# Patient Record
Sex: Male | Born: 1950 | Race: White | Hispanic: No | Marital: Married | Smoking: Never smoker
Health system: Southern US, Community
[De-identification: ages and names within clinical notes are randomized; demographics above are authoritative.]

## PROBLEM LIST (undated history)

## (undated) DIAGNOSIS — R6510 Systemic inflammatory response syndrome (SIRS) of non-infectious origin without acute organ dysfunction: Secondary | ICD-10-CM

## (undated) DIAGNOSIS — E119 Type 2 diabetes mellitus without complications: Secondary | ICD-10-CM

## (undated) DIAGNOSIS — N179 Acute kidney failure, unspecified: Secondary | ICD-10-CM

## (undated) DIAGNOSIS — I2510 Atherosclerotic heart disease of native coronary artery without angina pectoris: Secondary | ICD-10-CM

## (undated) DIAGNOSIS — I10 Essential (primary) hypertension: Secondary | ICD-10-CM

## (undated) DIAGNOSIS — I219 Acute myocardial infarction, unspecified: Secondary | ICD-10-CM

## (undated) DIAGNOSIS — I639 Cerebral infarction, unspecified: Secondary | ICD-10-CM

---

## 2000-06-18 ENCOUNTER — Emergency Department (HOSPITAL_COMMUNITY): Admission: EM | Admit: 2000-06-18 | Discharge: 2000-06-18 | Payer: Self-pay | Admitting: Emergency Medicine

## 2002-02-17 ENCOUNTER — Emergency Department (HOSPITAL_COMMUNITY): Admission: EM | Admit: 2002-02-17 | Discharge: 2002-02-17 | Payer: Self-pay

## 2002-11-25 DIAGNOSIS — I639 Cerebral infarction, unspecified: Secondary | ICD-10-CM

## 2002-11-25 HISTORY — DX: Cerebral infarction, unspecified: I63.9

## 2003-08-28 ENCOUNTER — Encounter: Payer: Self-pay | Admitting: Emergency Medicine

## 2003-08-28 ENCOUNTER — Inpatient Hospital Stay (HOSPITAL_COMMUNITY): Admission: EM | Admit: 2003-08-28 | Discharge: 2003-09-01 | Payer: Self-pay | Admitting: Emergency Medicine

## 2003-08-29 ENCOUNTER — Encounter: Payer: Self-pay | Admitting: Neurology

## 2003-08-30 ENCOUNTER — Encounter: Payer: Self-pay | Admitting: Neurology

## 2003-09-01 ENCOUNTER — Inpatient Hospital Stay (HOSPITAL_COMMUNITY)
Admission: RE | Admit: 2003-09-01 | Discharge: 2003-09-23 | Payer: Self-pay | Admitting: Physical Medicine & Rehabilitation

## 2003-09-06 ENCOUNTER — Encounter: Payer: Self-pay | Admitting: Physical Medicine & Rehabilitation

## 2003-11-01 ENCOUNTER — Encounter
Admission: RE | Admit: 2003-11-01 | Discharge: 2004-01-30 | Payer: Self-pay | Admitting: Physical Medicine & Rehabilitation

## 2003-11-08 ENCOUNTER — Encounter: Admission: RE | Admit: 2003-11-08 | Discharge: 2004-02-06 | Payer: Self-pay | Admitting: Internal Medicine

## 2004-02-24 ENCOUNTER — Emergency Department (HOSPITAL_COMMUNITY): Admission: EM | Admit: 2004-02-24 | Discharge: 2004-02-24 | Payer: Self-pay | Admitting: Emergency Medicine

## 2004-03-20 ENCOUNTER — Encounter
Admission: RE | Admit: 2004-03-20 | Discharge: 2004-06-18 | Payer: Self-pay | Admitting: Physical Medicine & Rehabilitation

## 2004-04-23 ENCOUNTER — Encounter
Admission: RE | Admit: 2004-04-23 | Discharge: 2004-07-22 | Payer: Self-pay | Admitting: Physical Medicine & Rehabilitation

## 2004-05-10 ENCOUNTER — Encounter
Admission: RE | Admit: 2004-05-10 | Discharge: 2004-07-22 | Payer: Self-pay | Admitting: Physical Medicine & Rehabilitation

## 2004-06-13 ENCOUNTER — Ambulatory Visit (HOSPITAL_COMMUNITY): Admission: RE | Admit: 2004-06-13 | Discharge: 2004-06-13 | Payer: Self-pay | Admitting: Neurology

## 2004-06-15 ENCOUNTER — Ambulatory Visit (HOSPITAL_COMMUNITY): Admission: RE | Admit: 2004-06-15 | Discharge: 2004-06-15 | Payer: Self-pay | Admitting: Interventional Radiology

## 2004-07-25 ENCOUNTER — Inpatient Hospital Stay (HOSPITAL_COMMUNITY): Admission: RE | Admit: 2004-07-25 | Discharge: 2004-07-27 | Payer: Self-pay | Admitting: Interventional Radiology

## 2004-07-25 HISTORY — PX: OTHER SURGICAL HISTORY: SHX169

## 2004-08-21 ENCOUNTER — Encounter: Admission: RE | Admit: 2004-08-21 | Discharge: 2004-10-02 | Payer: Self-pay | Admitting: Neurology

## 2004-08-21 ENCOUNTER — Encounter: Admission: RE | Admit: 2004-08-21 | Discharge: 2004-08-21 | Payer: Self-pay | Admitting: Interventional Radiology

## 2004-08-27 ENCOUNTER — Encounter: Admission: RE | Admit: 2004-08-27 | Discharge: 2004-08-27 | Payer: Self-pay | Admitting: Interventional Radiology

## 2011-12-21 ENCOUNTER — Inpatient Hospital Stay (HOSPITAL_COMMUNITY)
Admission: EM | Admit: 2011-12-21 | Discharge: 2011-12-25 | DRG: 305 | Disposition: A | Discharge status: Home Health | Payer: Medicare Other | Attending: Internal Medicine | Admitting: Internal Medicine

## 2011-12-21 ENCOUNTER — Encounter (HOSPITAL_COMMUNITY): Payer: Self-pay

## 2011-12-21 ENCOUNTER — Other Ambulatory Visit: Payer: Self-pay

## 2011-12-21 ENCOUNTER — Emergency Department (HOSPITAL_COMMUNITY): Payer: Medicare Other

## 2011-12-21 DIAGNOSIS — D72829 Elevated white blood cell count, unspecified: Secondary | ICD-10-CM | POA: Diagnosis present

## 2011-12-21 DIAGNOSIS — R51 Headache: Secondary | ICD-10-CM | POA: Diagnosis present

## 2011-12-21 DIAGNOSIS — E119 Type 2 diabetes mellitus without complications: Secondary | ICD-10-CM | POA: Diagnosis present

## 2011-12-21 DIAGNOSIS — I161 Hypertensive emergency: Secondary | ICD-10-CM

## 2011-12-21 DIAGNOSIS — IMO0002 Reserved for concepts with insufficient information to code with codable children: Secondary | ICD-10-CM | POA: Diagnosis present

## 2011-12-21 DIAGNOSIS — E1165 Type 2 diabetes mellitus with hyperglycemia: Secondary | ICD-10-CM | POA: Diagnosis present

## 2011-12-21 DIAGNOSIS — I2510 Atherosclerotic heart disease of native coronary artery without angina pectoris: Secondary | ICD-10-CM | POA: Diagnosis present

## 2011-12-21 DIAGNOSIS — R4182 Altered mental status, unspecified: Secondary | ICD-10-CM | POA: Diagnosis present

## 2011-12-21 DIAGNOSIS — E876 Hypokalemia: Secondary | ICD-10-CM | POA: Diagnosis present

## 2011-12-21 DIAGNOSIS — R739 Hyperglycemia, unspecified: Secondary | ICD-10-CM | POA: Diagnosis present

## 2011-12-21 DIAGNOSIS — I10 Essential (primary) hypertension: Principal | ICD-10-CM | POA: Diagnosis present

## 2011-12-21 DIAGNOSIS — Z8673 Personal history of transient ischemic attack (TIA), and cerebral infarction without residual deficits: Secondary | ICD-10-CM

## 2011-12-21 DIAGNOSIS — R519 Headache, unspecified: Secondary | ICD-10-CM | POA: Diagnosis present

## 2011-12-21 DIAGNOSIS — R000 Tachycardia, unspecified: Secondary | ICD-10-CM | POA: Diagnosis present

## 2011-12-21 HISTORY — DX: Atherosclerotic heart disease of native coronary artery without angina pectoris: I25.10

## 2011-12-21 HISTORY — DX: Acute myocardial infarction, unspecified: I21.9

## 2011-12-21 HISTORY — DX: Cerebral infarction, unspecified: I63.9

## 2011-12-21 HISTORY — DX: Essential (primary) hypertension: I10

## 2011-12-21 LAB — URINALYSIS, ROUTINE W REFLEX MICROSCOPIC
Bilirubin Urine: NEGATIVE
Ketones, ur: 15 mg/dL — AB
Leukocytes, UA: NEGATIVE
Nitrite: NEGATIVE
Specific Gravity, Urine: 1.03 (ref 1.005–1.030)
Urobilinogen, UA: 0.2 mg/dL (ref 0.0–1.0)
pH: 6.5 (ref 5.0–8.0)

## 2011-12-21 LAB — BASIC METABOLIC PANEL
BUN: 17 mg/dL (ref 6–23)
CO2: 26 mEq/L (ref 19–32)
Calcium: 10.8 mg/dL — ABNORMAL HIGH (ref 8.4–10.5)
Chloride: 92 mEq/L — ABNORMAL LOW (ref 96–112)
GFR calc Af Amer: 72 mL/min — ABNORMAL LOW (ref >90)
Sodium: 134 mEq/L — ABNORMAL LOW (ref 135–145)

## 2011-12-21 LAB — POCT I-STAT 3, VENOUS BLOOD GAS (G3P V)
Acid-Base Excess: 3 mmol/L — ABNORMAL HIGH (ref 0.0–2.0)
O2 Saturation: 87 %
pCO2, Ven: 50.5 mmHg — ABNORMAL HIGH (ref 45.0–50.0)
pO2, Ven: 55 mmHg — ABNORMAL HIGH (ref 30.0–45.0)

## 2011-12-21 LAB — URINE MICROSCOPIC-ADD ON

## 2011-12-21 LAB — CBC
MCH: 28.1 pg (ref 26.0–34.0)
MCV: 81.9 fL (ref 78.0–100.0)

## 2011-12-21 MED ORDER — SODIUM CHLORIDE 0.9 % IV SOLN
INTRAVENOUS | Status: DC
  Administered 2011-12-22: 01:00:00 via INTRAVENOUS

## 2011-12-21 MED ORDER — ONDANSETRON HCL 4 MG/2ML IJ SOLN
4.0000 mg | Freq: Once | INTRAMUSCULAR | Status: AC
  Administered 2011-12-21: 4 mg via INTRAVENOUS
  Filled 2011-12-21: qty 2

## 2011-12-21 MED ORDER — MORPHINE SULFATE 4 MG/ML IJ SOLN
4.0000 mg | Freq: Once | INTRAMUSCULAR | Status: AC
  Administered 2011-12-21: 4 mg via INTRAVENOUS
  Filled 2011-12-21: qty 1

## 2011-12-21 MED ORDER — SODIUM CHLORIDE 0.9 % IV SOLN
INTRAVENOUS | Status: DC
  Administered 2011-12-21: 4.4 [IU]/h via INTRAVENOUS
  Administered 2011-12-22: 10.2 [IU]/h via INTRAVENOUS
  Filled 2011-12-21 (×2): qty 1

## 2011-12-21 MED ORDER — DEXTROSE 50 % IV SOLN: 25.0000 mL | INTRAVENOUS | Status: DC | PRN

## 2011-12-21 MED ORDER — INSULIN REGULAR BOLUS VIA INFUSION
0.0000 [IU] | Freq: Three times a day (TID) | INTRAVENOUS | Status: DC
  Administered 2011-12-22: 2.8 [IU] via INTRAVENOUS
  Filled 2011-12-21 (×4): qty 10

## 2011-12-21 MED ORDER — ONDANSETRON HCL 4 MG/2ML IJ SOLN
4.0000 mg | Freq: Once | INTRAMUSCULAR | Status: AC
  Administered 2011-12-21: 4 mg via INTRAVENOUS

## 2011-12-21 MED ORDER — NICARDIPINE HCL IN NACL 20-0.86 MG/200ML-% IV SOLN
5.0000 mg/h | INTRAVENOUS | Status: DC
  Administered 2011-12-21: 5 mg/h via INTRAVENOUS
  Filled 2011-12-21 (×2): qty 200

## 2011-12-21 MED ORDER — ONDANSETRON HCL 4 MG/2ML IJ SOLN
INTRAMUSCULAR | Status: AC
  Filled 2011-12-21: qty 2

## 2011-12-21 NOTE — ED Notes (Signed)
CBG Result = 495

## 2011-12-21 NOTE — ED Provider Notes (Signed)
History     CSN: [id number]  Arrival date & time 12/21/11  1805   First MD Initiated Contact with Patient 12/21/11 1824      Chief Complaint  Patient presents with  . Headache    (Consider location/radiation/quality/duration/timing/severity/associated sxs/prior treatment) HPIcomplains of frontal headache gradual onset the beginning of this month. Treated with acetaminophen without relief. Patient also reports his blood sugars have been elevated for the past 3-4 weeks as he's run out of all of his medications. Vomited one time while in route by EMS Past Medical History  Diagnosis Date  . Stroke   . Diabetes mellitus   . Hypertension   . Coronary artery disease   . Myocardial infarction    patient reports he was taken off of antihypertensives by his physician  Past Surgical History  Procedure Date  . Basilar arterty stent     History reviewed. No pertinent family history.  History  Substance Use Topics  . Smoking status: Never Smoker   . Smokeless tobacco: Not on file  . Alcohol Use: No      Review of Systems  Constitutional: Negative.   Respiratory: Negative.   Cardiovascular: Negative.   Gastrointestinal: Negative.   Genitourinary:       Polyuria  Musculoskeletal: Negative.   Skin: Negative.   Neurological: Positive for headaches.       Left-sided hemiparesthesias from old stroke; walks with cane  Hematological: Negative.   Psychiatric/Behavioral: Negative.     Allergies  Review of patient's allergies indicates no known allergies.  Home Medications  No current outpatient prescriptions on file.  BP 224/138  Pulse 102  Temp(Src) 98.7 F (37.1 C) (Oral)  Resp 25  SpO2 95%  Physical Exam  Nursing note and vitals reviewed. Constitutional: He is oriented to person, place, and time.       Chronically ill appearing  HENT:  Head: Normocephalic and atraumatic.  Eyes: Conjunctivae are normal. Pupils are equal, round, and reactive to light.       Fundi  not well visualized  Neck: Neck supple. No tracheal deviation present. No thyromegaly present.  Cardiovascular: Normal rate and regular rhythm.   No murmur heard. Pulmonary/Chest: Effort normal and breath sounds normal.  Abdominal: Soft. Bowel sounds are normal. He exhibits no distension. There is no tenderness.  Musculoskeletal: Normal range of motion. He exhibits no edema and no tenderness.  Neurological: He is alert and oriented to person, place, and time. Coordination normal.       Flexion contracture of left upper extremity; walks with limp favoring left leg walks unassisted using cane  Skin: Skin is warm and dry. No rash noted.  Psychiatric: He has a normal mood and affect.    ED Course  Procedures (including critical care time)  Labs Reviewed - No data to display No results found.  Date: 12/21/2011  Rate: 100  Rhythm: sinus tachycardia  QRS Axis: left  Intervals: normal  ST/T Wave abnormalities: Lateral ischemic changes  Conduction Disutrbances:none  Narrative Interpretation:   Old EKG Reviewed: unchanged No significant change from 07/20/2004  No diagnosis found.  9:30 PM patient continues to complain of nausea states headache is somewhat improved he is alert appropriate Glasgow Coma Score 15 additional Zofran ordered for nausea he states headache is improved after treatment with morphine and being placed on nicardipine drip Results for orders placed during the hospital encounter of 12/21/11  BASIC METABOLIC PANEL      Component Value Range   Sodium 134 (*)  135 - 145 (mEq/L)   Potassium 4.0  3.5 - 5.1 (mEq/L)   Chloride 92 (*) 96 - 112 (mEq/L)   CO2 26  19 - 32 (mEq/L)   Glucose, Bld 541 (*) 70 - 99 (mg/dL)   BUN 17  6 - 23 (mg/dL)   Creatinine, Ser 1.61  0.50 - 1.35 (mg/dL)   Calcium 09.6 (*) 8.4 - 10.5 (mg/dL)   GFR calc non Af Amer 62 (*) >90 (mL/min)   GFR calc Af Amer 72 (*) >90 (mL/min)  CBC      Component Value Range   WBC 12.9 (*) 4.0 - 10.5 (K/uL)   RBC  5.58  4.22 - 5.81 (MIL/uL)   Hemoglobin 15.7  13.0 - 17.0 (g/dL)   HCT 04.5  40.9 - 81.1 (%)   MCV 81.9  78.0 - 100.0 (fL)   MCH 28.1  26.0 - 34.0 (pg)   MCHC 34.4  30.0 - 36.0 (g/dL)   RDW 91.4  78.2 - 95.6 (%)   Platelets 261  150 - 400 (K/uL)  POCT I-STAT TROPONIN I      Component Value Range   Troponin i, poc 0.02  0.00 - 0.08 (ng/mL)   Comment 3           POCT I-STAT 3, BLOOD GAS (G3P V)      Component Value Range   pH, Ven 7.376 (*) 7.250 - 7.300    pCO2, Ven 50.5 (*) 45.0 - 50.0 (mmHg)   pO2, Ven 55.0 (*) 30.0 - 45.0 (mmHg)   Bicarbonate 29.6 (*) 20.0 - 24.0 (mEq/L)   TCO2 31  0 - 100 (mmol/L)   O2 Saturation 87.0     Acid-Base Excess 3.0 (*) 0.0 - 2.0 (mmol/L)   Patient temperature 37.0 C     Collection site FEMORAL ARTERY     Sample type VENOUS    GLUCOSE, CAPILLARY      Component Value Range   Glucose-Capillary 495 (*) 70 - 99 (mg/dL)   Comment 1 Documented in Chart     Comment 2 Notify RN     Ct Head Wo Contrast  12/21/2011  *RADIOLOGY REPORT*  Clinical Data: 61 year old male with headache.  Elevated blood pressure.  CT HEAD WITHOUT CONTRAST  Technique:  Contiguous axial images were obtained from the base of the skull through the vertex without contrast.  Comparison: 07/25/2004.  Findings: Visualized paranasal sinuses and mastoids are clear.  No acute osseous abnormality identified.  Visualized orbits and scalp soft tissues are within normal limits.  Basilar artery stent re-identified. Calcified atherosclerosis at the skull base.  Chronic brainstem hypodensity compatible with lacunar infarcts.  Mild cerebral volume loss since 2005.  No ventriculomegaly. No midline shift, mass effect, or evidence of mass lesion.   No acute intracranial hemorrhage identified.  No evidence of cortically based acute infarction identified.  No suspicious intracranial vascular hyperdensity.  IMPRESSION: 1. No acute intracranial abnormality. 2.  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  Original Report Authenticated By: Harley Hallmark, M.D.    MDM   Concern for hypertensive encephalopathy and hypertensive emergency given elevated blood pressure and headache We'll also start insulin drip glucose stabilizer for blood  out of control Spoke with Dr.Doutova who will arrange for admission Diagnosis #1 hypertensive emergency #2 hyperglycemia #3 headache  CRITICAL CARE Performed by: Doug Sou   Total critical care time: 30 minute  Critical care time was exclusive of separately billable procedures and treating  other patients.  Critical care was necessary to treat or prevent imminent or life-threatening deterioration.  Critical care was time spent personally by me on the following activities: development of treatment plan with patient and/or surrogate as well as nursing, discussions with consultants, evaluation of patient's response to treatment, examination of patient, obtaining history from patient or surrogate, ordering and performing treatments and interventions, ordering and review of laboratory studies, ordering and review of radiographic studies, pulse oximetry and re-evaluation of patient's condition.    Doug Sou, MD 12/21/11 2150

## 2011-12-21 NOTE — ED Notes (Signed)
Pt attempting to get out of the bed slid down to his butt on the floor. Denies pain

## 2011-12-21 NOTE — H&P (Signed)
PCP:  Kirby Funk   Chief Complaint:  Headache  HPI: Carlos Black is a 61 y.o. male   has a past medical history of Stroke; Diabetes mellitus; Hypertension; Coronary artery disease; and Myocardial infarction.   Presented with  Headache for the past 2-3 weeks. Pain is in front of his head.  States he has not been taking any of his medications for 4-5 months he has run out and has not had a way to get into see his PCP to refill his medications. No chest pain, no shortness of breath. Very poor historian. Patient was noted to have a few episodes of vomiting since his arrival to ED  His BG was noted to be 495 Review of Systems:    Pertinent positives include: nausea, vomiting, headache  Constitutional:  No weight loss, night sweats, Fevers, chills, fatigue.  HEENT:  No headaches, Difficulty swallowing,Tooth/dental problems,Sore throat,  No sneezing, itching, ear ache, nasal congestion, post nasal drip,  Cardio-vascular:  No chest pain, Orthopnea, PND, anasarca, dizziness, palpitations.no Bilateral lower extremity swelling  GI:  No heartburn, indigestion, abdominal pain, diarrhea, change in bowel habits, loss of appetite, melena, blood in stool, hematoemesis Resp:  no shortness of breath at rest. No dyspnea on exertion, No excess mucus, no productive cough, No non-productive cough, No coughing up of blood.No change in color of mucus.No wheezing.No chest wall deformity  Skin:  no rash or lesions.  GU:  no dysuria, change in color of urine, no urgency or frequency. No flank pain.  Musculoskeletal:  No joint pain or swelling. No decreased range of motion. No back pain.  Psych:  No change in mood or affect. No depression or anxiety. No memory loss.  Neuro: no localizing neurological complaints, no tingling, no weakness, no double vision, no gait abnormality, no slurred speech   Otherwise ROS are negative except for above, 10 systems were reviewed  Past Medical History: Past  Medical History  Diagnosis Date  . Stroke   . Diabetes mellitus   . Hypertension   . Coronary artery disease   . Myocardial infarction    Past Surgical History  Procedure Date  . Basilar arterty stent      Medications: Prior to Admission medications   Medication Sig Start Date End Date Taking? Authorizing Provider  metFORMIN (GLUCOPHAGE) 500 MG tablet Take 500 mg by mouth 2 (two) times daily with a meal.   Yes Historical Provider, MD  simvastatin (ZOCOR) 40 MG tablet Take 40 mg by mouth every evening.   Yes Historical Provider, MD    Allergies:  No Known Allergies  Social History:  Ambulatory independently Lives at home   reports that he has never smoked. He does not have any smokeless tobacco history on file. He reports that he does not drink alcohol or use illicit drugs.   Family History: family history is not on file.    Physical Exam: Patient Vitals for the past 24 hrs:  BP Temp Temp src Pulse Resp SpO2  12/21/11 2200 169/92 mmHg - - 117  23  93 %  12/21/11 2130 198/126 mmHg - - 113  20  98 %  12/21/11 2109 171/102 mmHg - - 117  21  96 %  12/21/11 2100 162/102 mmHg - - 117  19  96 %  12/21/11 2030 171/107 mmHg - - 121  29  89 %  12/21/11 1930 206/137 mmHg - - 98  21  93 %  12/21/11 1900 212/147 mmHg - - 101  24  96 %  12/21/11 1830 222/149 mmHg - - 100  23  95 %  12/21/11 1821 224/138 mmHg 98.7 F (37.1 C) Oral 102  25  95 %    1. General:  in No Acute distress 2. Psychological: Alert and Oriented 3. Head/ENT:   Moist Mucous Membranes                          Head Non traumatic, neck supple                           Poor Dentition 4. SKIN: normal  Skin turgor,  Skin clean Dry and intact no rash 5. Heart: Regular rate and rhythm no Murmur, Rub or gallop 6. Lungs: Clear to auscultation bilaterally, no wheezes or crackles   7. Abdomen: Soft, non-tender, Non distended 8. Lower extremities: no clubbing, cyanosis, or edema 9. 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 MSK: Normal range of motion  body mass index is unknown because there is no height or weight on file.   Labs on Admission:   [facility] 12/21/11 1913  NA 134*  K 4.0  CL 92*  CO2 26  GLUCOSE 541*  BUN 17  CREATININE 1.22  CALCIUM 10.8*  MG --  PHOS --   No results found for this basename: AST:2,ALT:2,ALKPHOS:2,BILITOT:2,PROT:2,ALBUMIN:2 in the last 72 hours No results found for this basename: LIPASE:2,AMYLASE:2 in the last 72 hours  Basename 12/21/11 1913  WBC 12.9*  NEUTROABS --  HGB 15.7  HCT 45.7  MCV 81.9  PLT 261   No results found for this basename: CKTOTAL:3,CKMB:3,CKMBINDEX:3,TROPONINI:3 in the last 72 hours No results found for this basename: TSH,T4TOTAL,FREET3,T3FREE,THYROIDAB in the last 72 hours No results found for this basename: VITAMINB12:2,FOLATE:2,FERRITIN:2,TIBC:2,IRON:2,RETICCTPCT:2 in the last 72 hours No results found for this basename: HGBA1C    CrCl is unknown because there is no height on file for the current visit. ABG    Component Value Date/Time   HCO3 29.6* 12/21/2011 1955   TCO2 31 12/21/2011 1955   O2SAT 87.0 12/21/2011 1955     No results found for this basename: DDIMER     Other results:  I have pearsonaly reviewed this: ECG REPORT  Rate:103  Rhythm: 1 degree AV block  ST&T Change: nonspecific changes in leads I,II unchanged from prior  UA no infection   Cultures: No results found for this basename: sdes, specrequest, cult, reptstatus       Radiological Exams on Admission: Ct Head Wo Contrast  12/21/2011  *RADIOLOGY REPORT*  Clinical Data: 61 year old male with headache.  Elevated blood pressure.  CT HEAD WITHOUT CONTRAST  Technique:  Contiguous axial images were obtained from the base of the skull through the vertex without contrast.  Comparison: 07/25/2004.  Findings: Visualized paranasal sinuses and mastoids are clear.  No acute osseous abnormality identified.  Visualized orbits and scalp soft  tissues are within normal limits.  Basilar artery stent re-identified. Calcified atherosclerosis at the skull base.  Chronic brainstem hypodensity compatible with lacunar infarcts.  Mild cerebral volume loss since 2005.  No ventriculomegaly. No midline shift, mass effect, or evidence of mass lesion.   No acute intracranial hemorrhage identified.  No evidence of cortically based acute infarction identified.  No suspicious intracranial vascular hyperdensity.  IMPRESSION: 1. No acute intracranial abnormality. 2.  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  Original Report Authenticated  By: H.LEE HALL III, M.D.    Assessment/Plan 61 yo with hx of CVA and poor compliance here with hypertensive urgency and uncontrolled diabetes secondary to noncompliance with his medications Present on Admission:  .Hypertensive urgency - will admit to step down attempt to start him back on losartan and norvasc with PRN hydralazine, will wean off nocardin drip .Headache - CT of the head unremarkable  .Diabetes mellitus - insulin drip until under control then attempt to restart the home medications  .Hyperglycemia - insulin drip Leukocytosis - UA unremarkable will repeat  Prophylaxis:Lovenox  CODE STATUS: FULL CODE   Al Bracewell 12/21/2011, 10:26 PM

## 2011-12-21 NOTE — ED Notes (Signed)
Pt with headache for entire week, elevated BP, elevated CBG, noncompliant with medications, 18 gu RAC, voided on the floor prior to getting in the bed,

## 2011-12-22 ENCOUNTER — Encounter (HOSPITAL_COMMUNITY): Payer: Self-pay

## 2011-12-22 LAB — COMPREHENSIVE METABOLIC PANEL
BUN: 15 mg/dL (ref 6–23)
Calcium: 9.9 mg/dL (ref 8.4–10.5)
GFR calc Af Amer: 78 mL/min — ABNORMAL LOW (ref >90)
Glucose, Bld: 147 mg/dL — ABNORMAL HIGH (ref 70–99)
Sodium: 141 mEq/L (ref 135–145)
Total Protein: 8.1 g/dL (ref 6.0–8.3)

## 2011-12-22 LAB — GLUCOSE, CAPILLARY
Glucose-Capillary: 315 mg/dL — ABNORMAL HIGH (ref 70–99)
Glucose-Capillary: 380 mg/dL — ABNORMAL HIGH (ref 70–99)
Glucose-Capillary: 218 mg/dL — ABNORMAL HIGH (ref 70–99)
Glucose-Capillary: 260 mg/dL — ABNORMAL HIGH (ref 70–99)
Glucose-Capillary: 186 mg/dL — ABNORMAL HIGH (ref 70–99)
Glucose-Capillary: 104 mg/dL — ABNORMAL HIGH (ref 70–99)
Glucose-Capillary: 161 mg/dL — ABNORMAL HIGH (ref 70–99)
Glucose-Capillary: 206 mg/dL — ABNORMAL HIGH (ref 70–99)
Glucose-Capillary: 152 mg/dL — ABNORMAL HIGH (ref 70–99)
Glucose-Capillary: 261 mg/dL — ABNORMAL HIGH (ref 70–99)

## 2011-12-22 LAB — CBC
HCT: 43.8 % (ref 39.0–52.0)
Hemoglobin: 15.7 g/dL (ref 13.0–17.0)
Hemoglobin: 14.8 g/dL (ref 13.0–17.0)
MCH: 27.9 pg (ref 26.0–34.0)
MCV: 82 fL (ref 78.0–100.0)
RBC: 5.62 MIL/uL (ref 4.22–5.81)
WBC: 17.8 10*3/uL — ABNORMAL HIGH (ref 4.0–10.5)

## 2011-12-22 LAB — CARDIAC PANEL(CRET KIN+CKTOT+MB+TROPI)
CK, MB: 3.3 ng/mL (ref 0.3–4.0)
CK, MB: 2.8 ng/mL (ref 0.3–4.0)
Relative Index: INVALID (ref 0.0–2.5)
Relative Index: INVALID (ref 0.0–2.5)
Total CK: 67 U/L (ref 7–232)
Total CK: 60 U/L (ref 7–232)

## 2011-12-22 LAB — HEMOGLOBIN A1C: Hgb A1c MFr Bld: 13.2 % — ABNORMAL HIGH (ref <5.7)

## 2011-12-22 LAB — MAGNESIUM: Magnesium: 1.5 mg/dL (ref 1.5–2.5)

## 2011-12-22 MED ORDER — ACETAMINOPHEN 325 MG PO TABS
650.0000 mg | ORAL_TABLET | Freq: Four times a day (QID) | ORAL | Status: DC | PRN
  Administered 2011-12-22 (×2): 650 mg via ORAL
  Filled 2011-12-22 (×2): qty 2

## 2011-12-22 MED ORDER — HYDRALAZINE HCL 20 MG/ML IJ SOLN: 10.0000 mg | INTRAMUSCULAR | Status: DC | PRN

## 2011-12-22 MED ORDER — ENOXAPARIN SODIUM 40 MG/0.4ML ~~LOC~~ SOLN
40.0000 mg | Freq: Every day | SUBCUTANEOUS | Status: DC
  Administered 2011-12-22 – 2011-12-24 (×3): 40 mg via SUBCUTANEOUS
  Filled 2011-12-22 (×4): qty 0.4

## 2011-12-22 MED ORDER — CLONIDINE HCL 0.1 MG PO TABS
0.1000 mg | ORAL_TABLET | Freq: Two times a day (BID) | ORAL | Status: DC
  Filled 2011-12-22: qty 1

## 2011-12-22 MED ORDER — SIMVASTATIN 40 MG PO TABS
40.0000 mg | ORAL_TABLET | Freq: Every evening | ORAL | Status: DC
  Filled 2011-12-22: qty 1

## 2011-12-22 MED ORDER — LOSARTAN POTASSIUM 50 MG PO TABS
100.0000 mg | ORAL_TABLET | Freq: Every day | ORAL | Status: DC
  Administered 2011-12-22: 100 mg via ORAL
  Filled 2011-12-22 (×2): qty 2

## 2011-12-22 MED ORDER — HYDRALAZINE HCL 20 MG/ML IJ SOLN
10.0000 mg | INTRAMUSCULAR | Status: DC | PRN
  Administered 2011-12-22: 10 mg via INTRAVENOUS
  Filled 2011-12-22 (×2): qty 0.5

## 2011-12-22 MED ORDER — HYDRALAZINE HCL 20 MG/ML IJ SOLN
20.0000 mg | INTRAMUSCULAR | Status: DC | PRN
Start: 2011-12-22 — End: 2011-12-25
  Administered 2011-12-22: 20 mg via INTRAVENOUS
  Filled 2011-12-22: qty 1

## 2011-12-22 MED ORDER — AMLODIPINE BESYLATE 10 MG PO TABS
10.0000 mg | ORAL_TABLET | Freq: Every day | ORAL | Status: DC
  Administered 2011-12-22 – 2011-12-24 (×3): 10 mg via ORAL
  Filled 2011-12-22 (×4): qty 1

## 2011-12-22 MED ORDER — ALUM & MAG HYDROXIDE-SIMETH 200-200-20 MG/5ML PO SUSP: 30.0000 mL | Freq: Four times a day (QID) | ORAL | Status: DC | PRN

## 2011-12-22 MED ORDER — INFLUENZA VIRUS VACC SPLIT PF IM SUSP
0.5000 mL | INTRAMUSCULAR | Status: AC
  Filled 2011-12-22: qty 0.5

## 2011-12-22 MED ORDER — HYDROCODONE-ACETAMINOPHEN 5-325 MG PO TABS
1.0000 | ORAL_TABLET | ORAL | Status: DC | PRN
  Administered 2011-12-22: 1 via ORAL
  Administered 2011-12-23 – 2011-12-24 (×2): 2 via ORAL
  Filled 2011-12-22: qty 2
  Filled 2011-12-22: qty 1
  Filled 2011-12-22: qty 2

## 2011-12-22 MED ORDER — GUAIFENESIN-DM 100-10 MG/5ML PO SYRP: 5.0000 mL | ORAL_SOLUTION | ORAL | Status: DC | PRN

## 2011-12-22 MED ORDER — MORPHINE SULFATE 2 MG/ML IJ SOLN
1.0000 mg | INTRAMUSCULAR | Status: DC | PRN
  Administered 2011-12-22 (×2): 1 mg via INTRAVENOUS
  Filled 2011-12-22 (×2): qty 1

## 2011-12-22 MED ORDER — INSULIN GLARGINE 100 UNIT/ML ~~LOC~~ SOLN
5.0000 [IU] | Freq: Every day | SUBCUTANEOUS | Status: DC
  Administered 2011-12-22: 5 [IU] via SUBCUTANEOUS
  Filled 2011-12-22: qty 3

## 2011-12-22 MED ORDER — ROSUVASTATIN CALCIUM 10 MG PO TABS
10.0000 mg | ORAL_TABLET | Freq: Every day | ORAL | Status: DC
  Administered 2011-12-22 – 2011-12-24 (×3): 10 mg via ORAL
  Filled 2011-12-22 (×5): qty 1

## 2011-12-22 MED ORDER — INSULIN REGULAR BOLUS VIA INFUSION
0.0000 [IU] | Freq: Three times a day (TID) | INTRAVENOUS | Status: DC
  Administered 2011-12-22: 0.3 [IU] via INTRAVENOUS
  Filled 2011-12-22 (×3): qty 10

## 2011-12-22 MED ORDER — DEXTROSE-NACL 5-0.45 % IV SOLN
INTRAVENOUS | Status: DC
  Administered 2011-12-22: 04:00:00 via INTRAVENOUS

## 2011-12-22 MED ORDER — ONDANSETRON HCL 4 MG/2ML IJ SOLN
4.0000 mg | Freq: Four times a day (QID) | INTRAMUSCULAR | Status: DC | PRN
  Administered 2011-12-22: 4 mg via INTRAVENOUS
  Filled 2011-12-22: qty 2

## 2011-12-22 MED ORDER — ONDANSETRON HCL 4 MG PO TABS: 4.0000 mg | ORAL_TABLET | Freq: Four times a day (QID) | ORAL | Status: DC | PRN

## 2011-12-22 MED ORDER — INSULIN ASPART 100 UNIT/ML ~~LOC~~ SOLN
0.0000 [IU] | Freq: Three times a day (TID) | SUBCUTANEOUS | Status: DC
  Administered 2011-12-22: 5 [IU] via SUBCUTANEOUS
  Administered 2011-12-23: 12:00:00 9 [IU] via SUBCUTANEOUS
  Administered 2011-12-23: 5 [IU] via SUBCUTANEOUS
  Administered 2011-12-23: 9 [IU] via SUBCUTANEOUS
  Administered 2011-12-24 (×2): 3 [IU] via SUBCUTANEOUS
  Administered 2011-12-24: 08:00:00 5 [IU] via SUBCUTANEOUS
  Filled 2011-12-22: qty 3

## 2011-12-22 MED ORDER — INSULIN GLARGINE 100 UNIT/ML ~~LOC~~ SOLN
5.0000 [IU] | Freq: Every day | SUBCUTANEOUS | Status: DC
  Administered 2011-12-22 – 2011-12-23 (×2): 5 [IU] via SUBCUTANEOUS

## 2011-12-22 MED ORDER — HYDRALAZINE HCL 20 MG/ML IJ SOLN
10.0000 mg | INTRAMUSCULAR | Status: DC | PRN
  Filled 2011-12-22: qty 0.5

## 2011-12-22 MED ORDER — POTASSIUM CHLORIDE CRYS ER 20 MEQ PO TBCR
30.0000 meq | EXTENDED_RELEASE_TABLET | Freq: Once | ORAL | Status: AC
  Administered 2011-12-22: 30 meq via ORAL
  Filled 2011-12-22: qty 1

## 2011-12-22 MED ORDER — POTASSIUM & SODIUM PHOSPHATES 280-160-250 MG PO PACK
1.0000 | PACK | Freq: Three times a day (TID) | ORAL | Status: DC
  Administered 2011-12-22 – 2011-12-23 (×7): 1 via ORAL
  Filled 2011-12-22 (×12): qty 1

## 2011-12-22 MED ORDER — ALBUTEROL SULFATE (5 MG/ML) 0.5% IN NEBU: 2.5000 mg | INHALATION_SOLUTION | RESPIRATORY_TRACT | Status: DC | PRN

## 2011-12-22 MED ORDER — CLONIDINE HCL 0.1 MG PO TABS
0.1000 mg | ORAL_TABLET | Freq: Two times a day (BID) | ORAL | Status: DC
  Administered 2011-12-22 – 2011-12-23 (×3): 0.1 mg via ORAL
  Filled 2011-12-22 (×5): qty 1

## 2011-12-22 MED ORDER — SODIUM CHLORIDE 0.9 % IV SOLN
INTRAVENOUS | Status: DC
  Administered 2011-12-22: 13:00:00 via INTRAVENOUS
  Filled 2011-12-22: qty 1

## 2011-12-22 MED ORDER — ACETAMINOPHEN 650 MG RE SUPP: 650.0000 mg | Freq: Four times a day (QID) | RECTAL | Status: DC | PRN

## 2011-12-22 NOTE — Progress Notes (Signed)
Dr. Clent Ridges notified of pt's blood sugars and BPs -- plan to DC insulin drip and IV fluids. Renette Butters, Viona Gilmore

## 2011-12-22 NOTE — Progress Notes (Signed)
Dr. Clent Ridges notified of pt's BPs -- plan to begin Clonidine and increase dose of PRN Hydralazine. Renette Butters, Viona Gilmore

## 2011-12-22 NOTE — Progress Notes (Signed)
Dr. Clent Ridges notified of pt's blood sugars and BP -- plan to start Lantus. Renette Butters, Viona Gilmore

## 2011-12-22 NOTE — Progress Notes (Signed)
Dr. Clent Ridges notified of pt's BPs(see vitals) -- plan to change PRN Hydralazine parameters. Renette Butters, Viona Gilmore

## 2011-12-22 NOTE — Progress Notes (Signed)
Subjective: 61 yom admitted overnight with headache, hyperglycemia, hypertension. Has not been taking home meds for several weeks.  This morning feeling better.  Headache mostly resolved.  No specific complaints.  Objective: Vital signs in last 24 hours: Temp:  [97.7 F (36.5 C)-100.9 F (38.3 C)] 98.7 F (37.1 C) (01/27 0805) Pulse Rate:  [98-135] 108  (01/27 0940) Resp:  [0-29] 0  (01/27 0940) BP: (132-224)/(84-149) 157/93 mmHg (01/27 0940) SpO2:  [89 %-98 %] 97 % (01/27 0940) Weight:  [73.3 kg (161 lb 9.6 oz)] 73.3 kg (161 lb 9.6 oz) (01/27 0015) Weight change:     Intake/Output from previous day: 01/26 0701 - 01/27 0700 In: 398.3 [I.V.:398.3] Out: 1050 [Urine:1050] Intake/Output this shift: Total I/O In: 619.8 [P.O.:300; I.V.:319.8] Out: -   General appearance: alert, cooperative, distracted, no distress and slowed mentation Resp: clear to auscultation bilaterally Cardio: regular rate and rhythm, S1, S2 normal, no murmur, click, rub or gallop GI: normal findings: bowel sounds normal, no masses palpable and soft, non-tender Extremities: extremities normal, atraumatic, no cyanosis or edema Neurologic: Mental status: alertness: alert, orientation: time, date, person, place, thought content exhibits logical connections, tangential connections, loose associations Cranial nerves: normal Motor: decrease strength in left arm and left leg  Coordination: generally uncoordinated  Lab Results:  Basename 12/22/11 0420 12/22/11 0151  WBC 17.8* 17.4*  HGB 14.8 15.7  HCT 43.8 46.1  PLT 272 247   BMET  Basename 12/22/11 0420 12/22/11 0151 12/21/11 1913  NA 141 -- 134*  K 3.4* -- 4.0  CL 100 -- 92*  CO2 30 -- 26  GLUCOSE 147* -- 541*  BUN 15 -- 17  CREATININE 1.14 1.15 --  CALCIUM 9.9 -- 10.8*    Studies/Results: Ct Head Wo Contrast  12/21/2011  *RADIOLOGY REPORT*  Clinical Data: 61 year old male with headache.  Elevated blood pressure.  CT HEAD WITHOUT CONTRAST   Technique:  Contiguous axial images were obtained from the base of the skull through the vertex without contrast.  Comparison: 07/25/2004.  Findings: Visualized paranasal sinuses and mastoids are clear.  No acute osseous abnormality identified.  Visualized orbits and scalp soft tissues are within normal limits.  Basilar artery stent re-identified. Calcified atherosclerosis at the skull base.  Chronic brainstem hypodensity compatible with lacunar infarcts.  Mild cerebral volume loss since 2005.  No ventriculomegaly. No midline shift, mass effect, or evidence of mass lesion.   No acute intracranial hemorrhage identified.  No evidence of cortically based acute infarction identified.  No suspicious intracranial vascular hyperdensity.  IMPRESSION: 1. No acute intracranial abnormality. 2.  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  Original Report Authenticated By: Harley Hallmark, M.D.    Medications:  I have reviewed the patient's current medications. Prior to Admission:  Prescriptions prior to admission  Medication Sig Dispense Refill  . metFORMIN (GLUCOPHAGE) 500 MG tablet Take 500 mg by mouth 2 (two) times daily with a meal.      . simvastatin (ZOCOR) 40 MG tablet Take 40 mg by mouth every evening.       Scheduled:   . amLODipine  10 mg Oral Daily  . enoxaparin  40 mg Subcutaneous Daily  . insulin glargine  5 Units Subcutaneous Daily  . insulin regular  0-10 Units Intravenous TID WC  . losartan  100 mg Oral Daily  .  morphine injection  4 mg Intravenous Once  . ondansetron  4 mg Intravenous Once  . ondansetron (ZOFRAN) IV  4  mg Intravenous Once  . rosuvastatin  10 mg Oral q1800  . DISCONTD: simvastatin  40 mg Oral QPM   Continuous:   . sodium chloride 100 mL/hr at 12/22/11 0102  . dextrose 5 % and 0.45% NaCl 100 mL/hr at 12/22/11 0415  . insulin (NOVOLIN-R) infusion 7.1 Units/hr (12/22/11 1026)  . DISCONTD: niCARDipine 5 mg/hr (12/21/11 1938)    FAO:ZHYQMVHQIONGE, acetaminophen, albuterol, alum & mag hydroxide-simeth, dextrose, guaiFENesin-dextromethorphan, hydrALAZINE, HYDROcodone-acetaminophen, morphine, ondansetron (ZOFRAN) IV, ondansetron, DISCONTD: hydrALAZINE  Assessment/Plan: 1) uncontrolled diabetes mellitus: Currently on glucose stabilizer triturating for cbg <150 x 4 hrs.  Have started lantus 5 units with sliding scale coverage.  Will restart oral medications once more stable.    2) Hypertension: uncontrolled. Reports not on any home antihypertensives.  Started on amlodipine 10mg , losartan 100mg  and also has hydralazine prn.  Continue current meds. 3) tachycardia: likely due to volume loss with hyperglycemia.  Will encourage PO intake and continue IVF. 4)  Headache: improved.  Hold off on narcotics as these may be contributing to confusion. 5) altered mental status: Not sure of baseline.  Is alert and oriented, but confused.  Avoid narcotics and sedatives.  Continue to monitor. 6) left sided weakness: likely from prior stroke.  CT overnight with no acute events. 7) leucocytosis: UA is negative for sign of infection.  No other signs of infection- no fever, cough etc.  Suspect WBC's elevated due to stress.  Cont to monitor. 8) hypokalemia: repeat today. Recheck in am 9) hypophos: repleat today. Recheck in am. 10) dispo: will need to remain in sdu until glucose stable and off glucomander   LOS: 1 day   [facility] 12/22/2011, 10:28 AM

## 2011-12-23 LAB — COMPREHENSIVE METABOLIC PANEL
ALT: 19 U/L (ref 0–53)
CO2: 30 mEq/L (ref 19–32)
Calcium: 8.6 mg/dL (ref 8.4–10.5)
Creatinine, Ser: 1.72 mg/dL — ABNORMAL HIGH (ref 0.50–1.35)
GFR calc Af Amer: 47 mL/min — ABNORMAL LOW (ref >90)
GFR calc non Af Amer: 41 mL/min — ABNORMAL LOW (ref >90)
Glucose, Bld: 296 mg/dL — ABNORMAL HIGH (ref 70–99)
Sodium: 133 mEq/L — ABNORMAL LOW (ref 135–145)
Total Protein: 6.7 g/dL (ref 6.0–8.3)

## 2011-12-23 LAB — GLUCOSE, CAPILLARY
Glucose-Capillary: 348 mg/dL — ABNORMAL HIGH (ref 70–99)
Glucose-Capillary: 401 mg/dL — ABNORMAL HIGH (ref 70–99)

## 2011-12-23 LAB — CBC
Hemoglobin: 13.7 g/dL (ref 13.0–17.0)
MCH: 27.5 pg (ref 26.0–34.0)
MCHC: 32.9 g/dL (ref 30.0–36.0)
MCV: 83.6 fL (ref 78.0–100.0)

## 2011-12-23 LAB — PHOSPHORUS: Phosphorus: 3.4 mg/dL (ref 2.3–4.6)

## 2011-12-23 MED ORDER — GLIMEPIRIDE 2 MG PO TABS
2.0000 mg | ORAL_TABLET | Freq: Every day | ORAL | Status: DC
  Filled 2011-12-23: qty 1

## 2011-12-23 MED ORDER — LIVING WELL WITH DIABETES BOOK
Freq: Once | Status: AC
  Administered 2011-12-23: 20:00:00
  Filled 2011-12-23: qty 1

## 2011-12-23 MED ORDER — GLIMEPIRIDE 2 MG PO TABS
2.0000 mg | ORAL_TABLET | Freq: Every day | ORAL | Status: DC
  Administered 2011-12-23: 2 mg via ORAL
  Filled 2011-12-23 (×3): qty 1

## 2011-12-23 NOTE — Progress Notes (Signed)
Subjective:  Headache is better  Objective: Vital signs in last 24 hours: Temp:  [98.2 F (36.8 C)-99.8 F (37.7 C)] 98.2 F (36.8 C) (01/28 0409) Pulse Rate:  [62-115] 62  (01/28 0409) Resp:  [0-27] 22  (01/28 0409) BP: (114-179)/(75-114) 125/85 mmHg (01/28 0409) SpO2:  [88 %-98 %] 93 % (01/28 0409) Weight change:     Intake/Output from previous day: 01/27 0701 - 01/28 0700 In: 1380.8 [P.O.:780; I.V.:600.8] Out: 375 [Urine:375] Intake/Output this shift:    General appearance: alert and cooperative Resp: clear to auscultation bilaterally Cardio: regular rate and rhythm, S1, S2 normal, no murmur, click, rub or gallop GI: soft, non-tender; bowel sounds normal; no masses,  no organomegaly Extremities: extremities normal, atraumatic, no cyanosis or edema  Lab Results:  Basename 12/23/11 0600 12/22/11 0420  WBC 11.2* 17.8*  HGB 13.7 14.8  HCT 41.7 43.8  PLT 233 272   BMET  Basename 12/23/11 0600 12/22/11 0420  NA 133* 141  K 3.5 3.4*  CL 92* 100  CO2 30 30  GLUCOSE 296* 147*  BUN 24* 15  CREATININE 1.72* 1.14  CALCIUM 8.6 9.9    Studies/Results: Ct Head Wo Contrast  12/21/2011  *RADIOLOGY REPORT*  Clinical Data: 61 year old male with headache.  Elevated blood pressure.  CT HEAD WITHOUT CONTRAST  Technique:  Contiguous axial images were obtained from the base of the skull through the vertex without contrast.  Comparison: 07/25/2004.  Findings: Visualized paranasal sinuses and mastoids are clear.  No acute osseous abnormality identified.  Visualized orbits and scalp soft tissues are within normal limits.  Basilar artery stent re-identified. Calcified atherosclerosis at the skull base.  Chronic brainstem hypodensity compatible with lacunar infarcts.  Mild cerebral volume loss since 2005.  No ventriculomegaly. No midline shift, mass effect, or evidence of mass lesion.   No acute intracranial hemorrhage identified.  No evidence of cortically based acute infarction  identified.  No suspicious intracranial vascular hyperdensity.  IMPRESSION: 1. No acute intracranial abnormality. 2.  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  Original Report Authenticated By: Harley Hallmark, M.D.    Medications: I have reviewed the patient's current medications.  Assessment/Plan: Principal Problem:  *Hypertension  Much better, probably too low, Creatinine rising, discontinue losartan Active Problems:  Headache resolving  Diabetes mellitus better control on insulin lantus and ssi. Add glimeperide, hold metformin because of elevated creatinine.  Leukocytosis resolving, no sign of infection  Abnormal EKG  Check old ekg, cardiac enzymes are neg   LOS: 2 days   Carlos Black JOSEPH 12/23/2011, 7:16 AM

## 2011-12-23 NOTE — Progress Notes (Signed)
Pt report called to 6500 RN, VSS all meds given to current time. notified for flu vaccine. Family aware of new room number. Pt states he came with a cane via EMS, ED notified to look for it.

## 2011-12-23 NOTE — Evaluation (Signed)
Physical Therapy Evaluation Patient Details Name: Carlos Black MRN: [id number] DOB: 1951/01/02 Today's Date: 12/23/2011  Problem List:  Patient Active Problem List  Diagnoses  . Hypertension  . Headache  . Diabetes mellitus  . Hyperglycemia  . Leukocytosis    Past Medical History:  Past Medical History  Diagnosis Date  . Diabetes mellitus   . Hypertension   . Coronary artery disease   . Myocardial infarction   . Stroke 2004   Past Surgical History:  Past Surgical History  Procedure Date  . Basilar arterty stent     PT Assessment/Plan/Recommendation PT Assessment Clinical Impression Statement: Patient presents to the hospital with HTN. He has had a prolonged bed rest and is now presenting with generlized weakness and perhaps exagerration of pre-morbid stroke deficits. He will require PT in the acute care setting to ensure that he can transition safely home with decreased burden of care and increased independence. PT Recommendation/Assessment: Patient will need skilled PT in the acute care venue PT Problem List: Decreased strength;Decreased activity tolerance;Decreased balance;Decreased mobility;Decreased coordination;Decreased knowledge of precautions;Decreased cognition PT Therapy Diagnosis : Abnormality of gait;Difficulty walking;Generalized weakness PT Plan PT Frequency: Min 3X/week PT Treatment/Interventions: DME instruction;Gait training;Functional mobility training;Therapeutic activities;Therapeutic exercise;Balance training;Cognitive remediation;Patient/family education PT Recommendation Follow Up Recommendations: Home health PT;Supervision/Assistance - 24 hour (unless gait improves) Equipment Recommended: None recommended by PT PT Goals  Acute Rehab PT Goals PT Goal Formulation: With patient Time For Goal Achievement: 7 days Pt will go Sit to Stand: with supervision PT Goal: Sit to Stand - Progress: Goal set today Pt will go Stand to Sit: with  supervision PT Goal: Stand to Sit - Progress: Goal set today Pt will Stand: with supervision;3 - 5 min;with no upper extremity support (during functional task) PT Goal: Stand - Progress: Goal set today Pt will Ambulate: >150 feet;with supervision;with least restrictive assistive device PT Goal: Ambulate - Progress: Goal set today  PT Evaluation Precautions/Restrictions  Precautions Precautions: Fall Prior Functioning  Home Living Lives With: Family Receives Help From: Family Type of Home: House Home Layout: One level Home Access: Level entry Bathroom Shower/Tub: Engineer, manufacturing systems: Standard Home Adaptive Equipment: Straight cane Prior Function Level of Independence: Independent with basic ADLs;Independent with transfers;Requires assistive device for independence;Independent with gait Driving: No Cognition Cognition Arousal/Alertness: Awake/alert Orientation Level: Oriented X4 Cognition - Other Comments: Doe demonstrate with some mild confusion at times Sensation/Coordination Coordination Gross Motor Movements are Fluid and Coordinated: No Coordination and Movement Description: 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 Can reach neutral for inversion/eversion only, but unable to reach 0 degrees dorsiflexion. Hip abduction limited to approx 20 degrees secondary to increased tone and limited flexion of knee. LLE Strength LLE Overall Strength: Deficits;Due to premorbid status LLE Overall Strength Comments: 0/5 foot/ankle. Knee and hip grossly 2-3/5 LLE Tone LLE Tone Comments:  Increased tone left lower extremity throughout that is limiting ROM and graded muscle activity Mobility (including Balance) Bed Mobility Bed Mobility: Yes Supine to Sit: 7: Independent Sitting - Scoot to Edge of Bed: 7: Independent Sit to Supine: 7: Independent Transfers Transfers: Yes Sit to Stand: 4: Min assist;From bed Sit to Stand Details (indicate cue type and reason): Min -guard  assistance for safety secondary to mild imbalance upon assumption of stand - patient able to self correct. Decreased weight bearing left lower extremity with stand Stand to Sit: 4: Min assist Stand to Sit Details: Min-guard assistance for safety. Ambulation/Gait Ambulation/Gait: Yes Ambulation/Gait Assistance: 4: Min assist Ambulation/Gait Assistance Details (indicate cue type and reason): Assistance secondary to decreased balance and instability with gait leading to increased fall risk Ambulation Distance (Feet): 60 Feet Assistive device: Small based quad cane Gait Pattern: Left foot flat;Step-to pattern;Left circumduction;Left hip hike (Vaulting.)  Static Sitting Balance Static Sitting - Balance Support: Feet supported;No upper extremity supported Static Sitting - Level of Assistance: 7: Independent Static Standing Balance Static Standing - Balance Support: No upper extremity supported Static Standing - Level of Assistance: 5: Stand by assistance End of Session PT - End of Session Equipment Utilized During Treatment: Gait belt Activity Tolerance: Patient tolerated treatment well Patient left: in bed;with call bell in reach Nurse Communication: Mobility status for ambulation;Mobility status for transfers General Behavior During Session: [facility] for tasks performed  Edwyna Perfect, PT  Pager [phone]  12/23/2011, 10:55 AM

## 2011-12-23 NOTE — Progress Notes (Signed)
Inpatient Diabetes Program Recommendations  AACE/ADA: New Consensus Statement on Inpatient Glycemic Control (2009)  Target Ranges:  Prepandial:   less than 140 mg/dL      Peak postprandial:   less than 180 mg/dL (1-2 hours)      Critically ill patients:  140 - 180 mg/dL   Reason for Visit: Hyperglycemia  Results for Carlos Black, Carlos Black (MRN [id number]) as of 12/23/2011 15:25  Ref. Range 12/22/2011 21:41 12/23/2011 06:00 12/23/2011 08:23 12/23/2011 10:31 12/23/2011 12:01  Glucose-Capillary Latest Range: 70-99 mg/dL 259 (H)  563 (H)  875 (H)    Inpatient Diabetes Program Recommendations Insulin - Basal: Increase Lantus to 10 units QHS Insulin - Meal Coverage: May need meal coverage insulin if CBGs >200 mg/dL Oral Agents: Added Amaryl today Outpatient Referral: OP Diabetes Education consult for HgbA1C > 7.  Note: Will need PCP to manage diabetes after discharge.  HgbA1C - 13.2% - uncontrolled DM, If pt to go home on insulin, will order Insulin pen starter kit and begin education. Ordered Living Well With Diabetes book. RN - please encourage pt to view diabetes videos on pt ed channel.  Will follow.

## 2011-12-23 NOTE — Progress Notes (Signed)
Pts cbg was 401, however patient at a very late lunch, 3pm.  pts cbg should be taken at least 4 hours after his last meal

## 2011-12-24 ENCOUNTER — Encounter (HOSPITAL_COMMUNITY): Payer: Self-pay

## 2011-12-24 LAB — BASIC METABOLIC PANEL
Calcium: 8.8 mg/dL (ref 8.4–10.5)
GFR calc Af Amer: 59 mL/min — ABNORMAL LOW (ref >90)
GFR calc non Af Amer: 51 mL/min — ABNORMAL LOW (ref >90)
Glucose, Bld: 273 mg/dL — ABNORMAL HIGH (ref 70–99)
Potassium: 3.3 mEq/L — ABNORMAL LOW (ref 3.5–5.1)
Sodium: 136 mEq/L (ref 135–145)

## 2011-12-24 LAB — DIFFERENTIAL
Basophils Relative: 0 % (ref 0–1)
Eosinophils Absolute: 0.4 10*3/uL (ref 0.0–0.7)
Lymphs Abs: 2.5 10*3/uL (ref 0.7–4.0)
Monocytes Relative: 10 % (ref 3–12)
Neutro Abs: 4.4 10*3/uL (ref 1.7–7.7)
Neutrophils Relative %: 55 % (ref 43–77)

## 2011-12-24 LAB — CBC
Hemoglobin: 13.1 g/dL (ref 13.0–17.0)
MCH: 27 pg (ref 26.0–34.0)
Platelets: 199 10*3/uL (ref 150–400)
RBC: 4.85 MIL/uL (ref 4.22–5.81)

## 2011-12-24 LAB — MAGNESIUM: Magnesium: 1.7 mg/dL (ref 1.5–2.5)

## 2011-12-24 LAB — GLUCOSE, CAPILLARY

## 2011-12-24 MED ORDER — ZOLPIDEM TARTRATE 5 MG PO TABS
5.0000 mg | ORAL_TABLET | Freq: Every evening | ORAL | Status: DC | PRN
  Administered 2011-12-24: 5 mg via ORAL
  Filled 2011-12-24: qty 1

## 2011-12-24 MED ORDER — INFLUENZA VIRUS VACC SPLIT PF IM SUSP
0.5000 mL | Freq: Once | INTRAMUSCULAR | Status: DC
  Filled 2011-12-24: qty 0.5

## 2011-12-24 MED ORDER — GLIMEPIRIDE 4 MG PO TABS
4.0000 mg | ORAL_TABLET | Freq: Every day | ORAL | Status: DC
  Administered 2011-12-24 – 2011-12-25 (×2): 4 mg via ORAL
  Filled 2011-12-24 (×3): qty 1

## 2011-12-24 MED ORDER — POTASSIUM CHLORIDE CRYS ER 20 MEQ PO TBCR
20.0000 meq | EXTENDED_RELEASE_TABLET | Freq: Three times a day (TID) | ORAL | Status: DC
  Administered 2011-12-24 (×3): 20 meq via ORAL
  Filled 2011-12-24 (×6): qty 1

## 2011-12-24 MED ORDER — METFORMIN HCL 500 MG PO TABS
1000.0000 mg | ORAL_TABLET | Freq: Two times a day (BID) | ORAL | Status: DC
  Administered 2011-12-24 – 2011-12-25 (×3): 1000 mg via ORAL
  Filled 2011-12-24 (×5): qty 2

## 2011-12-24 NOTE — Progress Notes (Signed)
Met with pt and wife, also met with CSW [place] and CSW intern. Diabetes specialist has just completed her visit. We discussed with pt importance of MD visits and purchasing medications, pt is on disability, has medicare and receives almost $2000 per month in disability insurance. Pt wife is also disabled and receives a check each month. Both agree that they are able to buy the medications but copays are expensive. Pt and wife both state at this time that they have arranged transportation from family/friends. Johny Shock RN MPH Case Manager [phone]

## 2011-12-24 NOTE — Progress Notes (Signed)
Physical Therapy Treatment Patient Details Name: Carlos Black MRN: [id number] DOB: 1951-07-09 Today's Date: 12/24/2011  PT Assessment/Plan  PT - Assessment/Plan Comments on Treatment Session: Patient educated not to get up without assistance due to fall risk. Patient verbalized understanding.  PT Plan: Discharge plan remains appropriate Follow Up Recommendations: Supervision/Assistance - 24 hour;Home health PT Equipment Recommended: None recommended by PT PT Goals  Acute Rehab PT Goals PT Goal: Sit to Stand - Progress: Met PT Goal: Stand to Sit - Progress: Met PT Goal: Ambulate - Progress: Progressing toward goal  PT Treatment Precautions/Restrictions  Precautions Precautions: Fall Mobility (including Balance) Bed Mobility Bed Mobility: No Transfers Sit to Stand: 5: Supervision;From chair/3-in-1 Sit to Stand Details (indicate cue type and reason): x2. Pt. able to stand safely today without imbalance.  Stand to Sit: 5: Supervision;To chair/3-in-1 Stand to Sit Details: x2 Ambulation/Gait Ambulation/Gait Assistance: 4: Min assist;Other (comment) (MinGuard A) Ambulation/Gait Assistance Details (indicate cue type and reason): Pt with no LOB but still requires minguard A as he is unstable. Pt using SPC but when oppurtunity to hold on to side rail in hallway patient does so.  Ambulation Distance (Feet): 160 Feet Assistive device: Straight cane Gait Pattern: Step-to pattern;Left foot flat;Left hip hike (vaulting gait)    Exercise    End of Session PT - End of Session Equipment Utilized During Treatment: Gait belt Activity Tolerance: Patient tolerated treatment well Patient left: in chair;with call bell in reach Nurse Communication: Mobility status for transfers;Mobility status for ambulation General Behavior During Session: [facility] for tasks performed Cognition: [facility] for tasks performed  Shariece Viveiros, Adline Potter 12/24/2011, 1:51 PM 12/24/2011 Fredrich Birks  PTA [phone] pager [phone] office

## 2011-12-24 NOTE — Progress Notes (Signed)
Clinical Social Worker completed the psychosocial assessment, which can be found in the shadow chart. CSW will research community transportation resources and follow-up with patient prior to discharge or after discharge by phone.  Genelle Bal, MSW, LCSW [phone]

## 2011-12-24 NOTE — Progress Notes (Signed)
Pt reports he hasn't taken meds because he missed several doctor appts and they would not refill.  Wife states "we will get him to the doctor appts now." Stressed importance of taking diabetes meds.  Will need meter to check blood sugars at home. Hospital does not give meters to pts.  Encouraged pt to purchase [employer] brand meter and strips were less expensive than other meters.  Discussed HgbA1C of 13.2%. Pt states he drinks regular sodas and eats ice cream. Discussed importance of following healthy diet, checking blood sugars and taking meds in order to prevent complications.  Encouraged him to view diabetes videos on pt ed channel.  Given diabetes book.  Needs insulin with very high HgbA1C - recommend NPH or 70/30 insulin as more affordable than Lantus or Novolog.  Have made referral to [facility] Management program.  Will need to f/u with PCP in 1 week after discharge.  Helyn App, RD, LDN, CDE Inpatient Diabetes Coordinator

## 2011-12-24 NOTE — Progress Notes (Signed)
Subjective: No complaints  Objective: Vital signs in last 24 hours: Temp:  [97.8 F (36.6 C)-98.5 F (36.9 C)] 98.1 F (36.7 C) (01/29 0425) Pulse Rate:  [73-97] 78  (01/29 0425) Resp:  [14-20] 20  (01/29 0425) BP: (106-152)/(60-102) 124/78 mmHg (01/29 0425) SpO2:  [92 %-98 %] 98 % (01/29 0425) Weight change:     Intake/Output from previous day: 01/28 0701 - 01/29 0700 In: 480 [P.O.:480] Out: -  Intake/Output this shift:    General appearance: alert and cooperative Resp: clear to auscultation bilaterally Cardio: regular rate and rhythm, S1, S2 normal, no murmur, click, rub or gallop Extremities: extremities normal, atraumatic, no cyanosis or edema  Lab Results:  Basename 12/24/11 0539 12/23/11 0600  WBC 8.1 11.2*  HGB 13.1 13.7  HCT 40.6 41.7  PLT 199 233   BMET  Basename 12/24/11 0539 12/23/11 0600  NA 136 133*  K 3.3* 3.5  CL 96 92*  CO2 31 30  GLUCOSE 273* 296*  BUN 30* 24*  CREATININE 1.43* 1.72*  CALCIUM 8.8 8.6    Studies/Results: No results found.  Medications: I have reviewed the patient's current medications.  Assessment/Plan: Principal Problem:  *Hypertension  Much better control, may be able to discharge on one agent, d/c clonidine and follow Active Problems:  Headache resolved  Diabetes mellitus  Hope to manage with oral agents, due to noncompliance not a great candidate for insulin, treat with amaryl and metformin, discontinue insulin, teach blood sugar monitoring, followup in one week  Leukocytosis resolved  Disposition, hope to d/c in am with home health PT, will need community case management, help with med management, transportation to MD office, continuing education re: diabetes and hypertension   LOS: 3 days   Carlos Black JOSEPH 12/24/2011, 7:17 AM

## 2011-12-25 LAB — BASIC METABOLIC PANEL
BUN: 21 mg/dL (ref 6–23)
CO2: 29 mEq/L (ref 19–32)
Calcium: 9.3 mg/dL (ref 8.4–10.5)
Glucose, Bld: 259 mg/dL — ABNORMAL HIGH (ref 70–99)
Sodium: 139 mEq/L (ref 135–145)

## 2011-12-25 MED ORDER — METFORMIN HCL 1000 MG PO TABS: 1000.0000 mg | ORAL_TABLET | Freq: Two times a day (BID) | ORAL | Status: DC

## 2011-12-25 MED ORDER — ATORVASTATIN CALCIUM 10 MG PO TABS: 10.0000 mg | ORAL_TABLET | Freq: Every day | ORAL | Status: AC

## 2011-12-25 MED ORDER — AMLODIPINE BESYLATE 10 MG PO TABS: 10.0000 mg | ORAL_TABLET | Freq: Every day | ORAL | Status: AC

## 2011-12-25 MED ORDER — GLIMEPIRIDE 4 MG PO TABS: 4.0000 mg | ORAL_TABLET | Freq: Every day | ORAL | Status: DC

## 2011-12-25 MED ORDER — ASPIRIN 81 MG PO TBEC: 81.0000 mg | DELAYED_RELEASE_TABLET | Freq: Every day | ORAL | Status: AC

## 2011-12-25 NOTE — Progress Notes (Signed)
Referral received from [facility] RN CM. The patient is not appropriate for [facility] services due to his [facility] primary payor.  Dr. Valentina Lucks requested that the patient be connected to [facility] disease management in light of this.  Notified RN CM for follow up.

## 2011-12-25 NOTE — Progress Notes (Addendum)
Noted plan to d/c pt, in order to setup HHPT , MD needs to place home health order and completer face to face form. Will notifiy pt RN.  Johny Shock RN MPH Case Manager [phone]     a  CARE MANAGEMENT NOTE 12/25/2011  Patient:  AVERILL, Carlos Black   Account Number:  [id number]  Date Initiated:  12/25/2011  Documentation initiated by:  Mersadie Kavanaugh  Subjective/Objective Assessment:   Order for HHPT and long term case management from [facility]     Action/Plan:   Referral made to [facility], for case management   Anticipated DC Date:  12/25/2011   Anticipated DC Plan:  HOME W HOME HEALTH SERVICES         Choice offered to / List presented to:  C-1 Patient        HH arranged  HH-1 RN  HH-2 PT      [facility] agency  [facility]   Status of service:  Completed, signed off Medicare Important Message given?   (If response is "NO", the following Medicare IM given date fields will be blank) Date Medicare IM given:   Date Additional Medicare IM given:    Discharge Disposition:  HOME W HOME HEALTH SERVICES  Per UR Regulation:    Comments:    Pt MD notified of pt RN of need for home health order and face to face form, however he has failed to complete this, [facility] has agreed to followup and provide these services to this patient.  Johny Shock RN MPH Case Manager [phone]

## 2011-12-25 NOTE — Progress Notes (Signed)
This CM spoke with pt insurance provider, rep , Zelda re possible case management for this pt. Per insurance rep this pt does not qualify for case management as he has only one hospitalization and while the pt has certainly been noncompliant he hopefully will gain the insight needed from [facility] teaching to improve his compliance with diabetes management and medications. [facility] providing HHRN and HHPT. Johny Shock RN MPH Case Management [phone]

## 2011-12-25 NOTE — Discharge Summary (Signed)
Physician Discharge Summary  Patient ID: Chief Walkup MRN: [id number] DOB/AGE: 63-Apr-1952 63 y.o.  Admit date: 12/21/2011 Discharge date: 12/25/2011  Admission Diagnoses: Hypertensive urgency Headache Diabetes mellitus, out of control Hypokalemia   Discharge Diagnoses:  Principal Problem:  Hypertensive urgency Active Problems:  Headache  Diabetes mellitus Hypokalemia     Discharged Condition: good  Hospital Course: The patient was admitted on January 26 complaining of headache for 2 or 3 weeks. He's not been taking any of his medications for 4-5 months prior to the admission. His blood pressure in the ER was initially T. 24/138, with treatment dropped to 169/92. The patient's blood sugar was 541 at admission with an A1c of over 13. EKG showed first degree AV block and nonspecific T wave changes without change. 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 The patient was admitted to the step down unit and started on Norvasc and losartan with when necessary hydralazine. He was weaned off the nicardipine drip. Start on insulin-dependent transfer to a low dose of insolent Lantus and a sliding scale. His creatinine rose to 1.72 on losartan and that was discontinued. Clonidine was added to Norvasc but this resulted in a pressures in the low 100s and the clonidine was stopped. At discharge he remained on amlodipine alone 10 mg with blood pressures ranging between 140 and 157/74-96. His blood sugars at discharge are running in the 200s on metformin and glimepiride, insulinoma stopped. He was seen by the nutritionist and diabetic educator. He was instructed to obtain a home blood sugar monitor. Community case management was arranged. The patient's potassium dropped and he was given potassium supplementation, discharge potassium 3.7 with a creatinine 1.27. His potassium will be rechecked as  an outpatient.  CODE STATUS full code Discharge diet: Low-sodium diabetic diet Activity as tolerated  Consults: None  Significant Diagnostic Studies: labs: On chart and radiology: CXR: normal and CT scan: As above  Treatments: cardiac meds: amlodipine blood pressure medications and insulin-dependent oral hypoglycemics  Discharge Exam: Blood pressure 151/96, pulse 85, temperature 97.3 F (36.3 C), temperature source Oral, resp. rate 18, height 5\' 5"  (1.651 m), weight 73.3 kg (161 lb 9.6 oz), SpO2 96.00%. Resp: clear to auscultation bilaterally Cardio: regular rate and rhythm, S1, S2 normal, no murmur, click, rub or gallop  Disposition:    Medication List  As of 12/25/2011  7:45 AM   STOP taking these medications         simvastatin 40 MG tablet         TAKE these medications         amLODipine 10 MG tablet   Commonly known as: NORVASC   Take 1 tablet (10 mg total) by mouth daily.      aspirin 81 MG EC tablet   Take 1 tablet (81 mg total) by mouth daily. Swallow whole.      atorvastatin 10 MG tablet   Commonly known as: LIPITOR   Take 1 tablet (10 mg total) by mouth daily.      glimepiride 4 MG tablet   Commonly known as: AMARYL   Take 1 tablet (4 mg total) by mouth daily with breakfast.      metFORMIN 1000 MG tablet   Commonly known as: GLUCOPHAGE   Take 1 tablet (1,000 mg total) by mouth 2 (two) times daily with a meal.           Follow-up  Information    Follow up with Lillia Mountain, MD in 8 days.   Contact information:   [address], [address] [employer] [place], [place]. [place] [address] [phone]          Signed: Lillia Mountain 12/25/2011, 7:45 AM

## 2015-09-05 ENCOUNTER — Inpatient Hospital Stay (HOSPITAL_COMMUNITY)
Admission: EM | Admit: 2015-09-05 | Discharge: 2015-09-14 | DRG: 841 | Disposition: A | Discharge status: Skilled Nursing Facility | Payer: Medicare HMO | Attending: Internal Medicine | Admitting: Internal Medicine

## 2015-09-05 ENCOUNTER — Emergency Department (HOSPITAL_COMMUNITY): Payer: Medicare HMO

## 2015-09-05 ENCOUNTER — Encounter (HOSPITAL_COMMUNITY): Payer: Self-pay | Admitting: *Deleted

## 2015-09-05 ENCOUNTER — Inpatient Hospital Stay (HOSPITAL_COMMUNITY): Payer: Medicare HMO

## 2015-09-05 DIAGNOSIS — N184 Chronic kidney disease, stage 4 (severe): Secondary | ICD-10-CM | POA: Diagnosis present

## 2015-09-05 DIAGNOSIS — I69354 Hemiplegia and hemiparesis following cerebral infarction affecting left non-dominant side: Secondary | ICD-10-CM

## 2015-09-05 DIAGNOSIS — I959 Hypotension, unspecified: Secondary | ICD-10-CM | POA: Diagnosis present

## 2015-09-05 DIAGNOSIS — R6510 Systemic inflammatory response syndrome (SIRS) of non-infectious origin without acute organ dysfunction: Secondary | ICD-10-CM | POA: Diagnosis not present

## 2015-09-05 DIAGNOSIS — I472 Ventricular tachycardia: Secondary | ICD-10-CM | POA: Diagnosis present

## 2015-09-05 DIAGNOSIS — E785 Hyperlipidemia, unspecified: Secondary | ICD-10-CM | POA: Diagnosis present

## 2015-09-05 DIAGNOSIS — R591 Generalized enlarged lymph nodes: Secondary | ICD-10-CM

## 2015-09-05 DIAGNOSIS — D649 Anemia, unspecified: Secondary | ICD-10-CM | POA: Diagnosis present

## 2015-09-05 DIAGNOSIS — L299 Pruritus, unspecified: Secondary | ICD-10-CM | POA: Diagnosis not present

## 2015-09-05 DIAGNOSIS — I4892 Unspecified atrial flutter: Secondary | ICD-10-CM | POA: Diagnosis not present

## 2015-09-05 DIAGNOSIS — R0682 Tachypnea, not elsewhere classified: Secondary | ICD-10-CM

## 2015-09-05 DIAGNOSIS — N19 Unspecified kidney failure: Secondary | ICD-10-CM | POA: Diagnosis not present

## 2015-09-05 DIAGNOSIS — Z79899 Other long term (current) drug therapy: Secondary | ICD-10-CM

## 2015-09-05 DIAGNOSIS — M5442 Lumbago with sciatica, left side: Secondary | ICD-10-CM | POA: Diagnosis not present

## 2015-09-05 DIAGNOSIS — E871 Hypo-osmolality and hyponatremia: Secondary | ICD-10-CM | POA: Diagnosis present

## 2015-09-05 DIAGNOSIS — B370 Candidal stomatitis: Secondary | ICD-10-CM | POA: Diagnosis present

## 2015-09-05 DIAGNOSIS — E1122 Type 2 diabetes mellitus with diabetic chronic kidney disease: Secondary | ICD-10-CM | POA: Diagnosis present

## 2015-09-05 DIAGNOSIS — N390 Urinary tract infection, site not specified: Secondary | ICD-10-CM | POA: Diagnosis present

## 2015-09-05 DIAGNOSIS — E860 Dehydration: Secondary | ICD-10-CM | POA: Diagnosis present

## 2015-09-05 DIAGNOSIS — E875 Hyperkalemia: Secondary | ICD-10-CM | POA: Diagnosis present

## 2015-09-05 DIAGNOSIS — R1900 Intra-abdominal and pelvic swelling, mass and lump, unspecified site: Secondary | ICD-10-CM | POA: Insufficient documentation

## 2015-09-05 DIAGNOSIS — Z833 Family history of diabetes mellitus: Secondary | ICD-10-CM | POA: Diagnosis not present

## 2015-09-05 DIAGNOSIS — N320 Bladder-neck obstruction: Secondary | ICD-10-CM | POA: Diagnosis present

## 2015-09-05 DIAGNOSIS — D509 Iron deficiency anemia, unspecified: Secondary | ICD-10-CM | POA: Diagnosis present

## 2015-09-05 DIAGNOSIS — D630 Anemia in neoplastic disease: Secondary | ICD-10-CM | POA: Diagnosis present

## 2015-09-05 DIAGNOSIS — N178 Other acute kidney failure: Secondary | ICD-10-CM | POA: Diagnosis not present

## 2015-09-05 DIAGNOSIS — E1165 Type 2 diabetes mellitus with hyperglycemia: Secondary | ICD-10-CM | POA: Diagnosis present

## 2015-09-05 DIAGNOSIS — N1330 Unspecified hydronephrosis: Secondary | ICD-10-CM | POA: Diagnosis present

## 2015-09-05 DIAGNOSIS — T380X5A Adverse effect of glucocorticoids and synthetic analogues, initial encounter: Secondary | ICD-10-CM | POA: Diagnosis present

## 2015-09-05 DIAGNOSIS — C8590 Non-Hodgkin lymphoma, unspecified, unspecified site: Secondary | ICD-10-CM

## 2015-09-05 DIAGNOSIS — D508 Other iron deficiency anemias: Secondary | ICD-10-CM | POA: Diagnosis not present

## 2015-09-05 DIAGNOSIS — C8330 Diffuse large B-cell lymphoma, unspecified site: Secondary | ICD-10-CM | POA: Diagnosis present

## 2015-09-05 DIAGNOSIS — R000 Tachycardia, unspecified: Secondary | ICD-10-CM

## 2015-09-05 DIAGNOSIS — R200 Anesthesia of skin: Secondary | ICD-10-CM | POA: Diagnosis present

## 2015-09-05 DIAGNOSIS — I2510 Atherosclerotic heart disease of native coronary artery without angina pectoris: Secondary | ICD-10-CM | POA: Diagnosis present

## 2015-09-05 DIAGNOSIS — M6289 Other specified disorders of muscle: Secondary | ICD-10-CM | POA: Diagnosis not present

## 2015-09-05 DIAGNOSIS — I129 Hypertensive chronic kidney disease with stage 1 through stage 4 chronic kidney disease, or unspecified chronic kidney disease: Secondary | ICD-10-CM | POA: Diagnosis present

## 2015-09-05 DIAGNOSIS — Z8673 Personal history of transient ischemic attack (TIA), and cerebral infarction without residual deficits: Secondary | ICD-10-CM

## 2015-09-05 DIAGNOSIS — N179 Acute kidney failure, unspecified: Secondary | ICD-10-CM | POA: Diagnosis present

## 2015-09-05 DIAGNOSIS — M6281 Muscle weakness (generalized): Secondary | ICD-10-CM | POA: Diagnosis not present

## 2015-09-05 DIAGNOSIS — R509 Fever, unspecified: Secondary | ICD-10-CM

## 2015-09-05 DIAGNOSIS — D500 Iron deficiency anemia secondary to blood loss (chronic): Secondary | ICD-10-CM | POA: Diagnosis not present

## 2015-09-05 DIAGNOSIS — R1909 Other intra-abdominal and pelvic swelling, mass and lump: Secondary | ICD-10-CM | POA: Diagnosis not present

## 2015-09-05 DIAGNOSIS — C8599 Non-Hodgkin lymphoma, unspecified, extranodal and solid organ sites: Secondary | ICD-10-CM | POA: Diagnosis present

## 2015-09-05 DIAGNOSIS — E119 Type 2 diabetes mellitus without complications: Secondary | ICD-10-CM

## 2015-09-05 DIAGNOSIS — I252 Old myocardial infarction: Secondary | ICD-10-CM

## 2015-09-05 DIAGNOSIS — M549 Dorsalgia, unspecified: Secondary | ICD-10-CM | POA: Diagnosis present

## 2015-09-05 DIAGNOSIS — R531 Weakness: Secondary | ICD-10-CM | POA: Insufficient documentation

## 2015-09-05 DIAGNOSIS — N170 Acute kidney failure with tubular necrosis: Secondary | ICD-10-CM | POA: Diagnosis not present

## 2015-09-05 DIAGNOSIS — R1310 Dysphagia, unspecified: Secondary | ICD-10-CM

## 2015-09-05 DIAGNOSIS — Z66 Do not resuscitate: Secondary | ICD-10-CM | POA: Diagnosis present

## 2015-09-05 DIAGNOSIS — N5089 Other specified disorders of the male genital organs: Secondary | ICD-10-CM

## 2015-09-05 HISTORY — DX: Atherosclerotic heart disease of native coronary artery without angina pectoris: I25.10

## 2015-09-05 HISTORY — DX: Type 2 diabetes mellitus without complications: E11.9

## 2015-09-05 HISTORY — DX: Acute kidney failure, unspecified: N17.9

## 2015-09-05 HISTORY — DX: Systemic inflammatory response syndrome (sirs) of non-infectious origin without acute organ dysfunction: R65.10

## 2015-09-05 LAB — COMPREHENSIVE METABOLIC PANEL
ALK PHOS: 79 U/L (ref 38–126)
ALT: 21 U/L (ref 17–63)
ANION GAP: 11 (ref 5–15)
AST: 50 U/L — ABNORMAL HIGH (ref 15–41)
Albumin: 3 g/dL — ABNORMAL LOW (ref 3.5–5.0)
BILIRUBIN TOTAL: 1.3 mg/dL — AB (ref 0.3–1.2)
BUN: 37 mg/dL — ABNORMAL HIGH (ref 6–20)
CALCIUM: 9.2 mg/dL (ref 8.9–10.3)
CO2: 25 mmol/L (ref 22–32)
Chloride: 93 mmol/L — ABNORMAL LOW (ref 101–111)
Creatinine, Ser: 3.44 mg/dL — ABNORMAL HIGH (ref 0.61–1.24)
GFR calc Af Amer: 20 mL/min — ABNORMAL LOW (ref >60)
GFR, EST NON AFRICAN AMERICAN: 17 mL/min — AB (ref >60)
Glucose, Bld: 150 mg/dL — ABNORMAL HIGH (ref 65–99)
POTASSIUM: 5.9 mmol/L — AB (ref 3.5–5.1)
Sodium: 129 mmol/L — ABNORMAL LOW (ref 135–145)
TOTAL PROTEIN: 7.3 g/dL (ref 6.5–8.1)

## 2015-09-05 LAB — CBC
HCT: 34.3 % — ABNORMAL LOW (ref 39.0–52.0)
Hemoglobin: 11.2 g/dL — ABNORMAL LOW (ref 13.0–17.0)
MCH: 24.8 pg — ABNORMAL LOW (ref 26.0–34.0)
MCHC: 32.7 g/dL (ref 30.0–36.0)
MCV: 75.9 fL — AB (ref 78.0–100.0)
PLATELETS: 267 10*3/uL (ref 150–400)
RBC: 4.52 MIL/uL (ref 4.22–5.81)
RDW: 16.1 % — ABNORMAL HIGH (ref 11.5–15.5)
WBC: 5.9 10*3/uL (ref 4.0–10.5)

## 2015-09-05 LAB — URINALYSIS, ROUTINE W REFLEX MICROSCOPIC
BILIRUBIN URINE: NEGATIVE
GLUCOSE, UA: NEGATIVE mg/dL
Ketones, ur: 15 mg/dL — AB
Nitrite: NEGATIVE
Protein, ur: 100 mg/dL — AB
SPECIFIC GRAVITY, URINE: 1.016 (ref 1.005–1.030)
UROBILINOGEN UA: 1 mg/dL (ref 0.0–1.0)
pH: 5 (ref 5.0–8.0)

## 2015-09-05 LAB — BASIC METABOLIC PANEL
ANION GAP: 11 (ref 5–15)
BUN: 32 mg/dL — ABNORMAL HIGH (ref 6–20)
CO2: 23 mmol/L (ref 22–32)
Calcium: 8.2 mg/dL — ABNORMAL LOW (ref 8.9–10.3)
Chloride: 98 mmol/L — ABNORMAL LOW (ref 101–111)
Creatinine, Ser: 3.24 mg/dL — ABNORMAL HIGH (ref 0.61–1.24)
GFR calc non Af Amer: 19 mL/min — ABNORMAL LOW (ref >60)
GFR, EST AFRICAN AMERICAN: 22 mL/min — AB (ref >60)
GLUCOSE: 117 mg/dL — AB (ref 65–99)
POTASSIUM: 5 mmol/L (ref 3.5–5.1)
Sodium: 132 mmol/L — ABNORMAL LOW (ref 135–145)

## 2015-09-05 LAB — SODIUM, URINE, RANDOM: SODIUM UR: 75 mmol/L

## 2015-09-05 LAB — PROTIME-INR
INR: 1.26 (ref 0.00–1.49)
PROTHROMBIN TIME: 15.9 s — AB (ref 11.6–15.2)

## 2015-09-05 LAB — I-STAT TROPONIN, ED: TROPONIN I, POC: 0 ng/mL (ref 0.00–0.08)

## 2015-09-05 LAB — MAGNESIUM
MAGNESIUM: 1.6 mg/dL — AB (ref 1.7–2.4)
Magnesium: 1.8 mg/dL (ref 1.7–2.4)

## 2015-09-05 LAB — URINE MICROSCOPIC-ADD ON

## 2015-09-05 LAB — TROPONIN I

## 2015-09-05 LAB — GLUCOSE, CAPILLARY
GLUCOSE-CAPILLARY: 109 mg/dL — AB (ref 65–99)
GLUCOSE-CAPILLARY: 120 mg/dL — AB (ref 65–99)

## 2015-09-05 LAB — DIFFERENTIAL
BASOS ABS: 0 10*3/uL (ref 0.0–0.1)
Basophils Relative: 1 %
Eosinophils Absolute: 0.1 10*3/uL (ref 0.0–0.7)
Eosinophils Relative: 2 %
LYMPHS PCT: 12 %
Lymphs Abs: 0.7 10*3/uL (ref 0.7–4.0)
MONO ABS: 1.6 10*3/uL — AB (ref 0.1–1.0)
Monocytes Relative: 27 %
NEUTROS ABS: 3.4 10*3/uL (ref 1.7–7.7)
Neutrophils Relative %: 58 %

## 2015-09-05 LAB — PROCALCITONIN: Procalcitonin: 0.86 ng/mL

## 2015-09-05 LAB — CREATININE, URINE, RANDOM: Creatinine, Urine: 176.86 mg/dL

## 2015-09-05 LAB — I-STAT CG4 LACTIC ACID, ED
LACTIC ACID, VENOUS: 1.99 mmol/L (ref 0.5–2.0)
Lactic Acid, Venous: 1.7 mmol/L (ref 0.5–2.0)

## 2015-09-05 LAB — APTT: aPTT: 32 seconds (ref 24–37)

## 2015-09-05 LAB — MRSA PCR SCREENING: MRSA by PCR: NEGATIVE

## 2015-09-05 LAB — TSH: TSH: 2.894 u[IU]/mL (ref 0.350–4.500)

## 2015-09-05 LAB — LACTIC ACID, PLASMA: Lactic Acid, Venous: 1.9 mmol/L (ref 0.5–2.0)

## 2015-09-05 MED ORDER — HEPARIN SODIUM (PORCINE) 5000 UNIT/ML IJ SOLN
5000.0000 [IU] | Freq: Three times a day (TID) | INTRAMUSCULAR | Status: DC
  Administered 2015-09-05 – 2015-09-07 (×5): 5000 [IU] via SUBCUTANEOUS
  Filled 2015-09-05 (×5): qty 1

## 2015-09-05 MED ORDER — INSULIN ASPART 100 UNIT/ML ~~LOC~~ SOLN
0.0000 [IU] | Freq: Three times a day (TID) | SUBCUTANEOUS | Status: DC
  Administered 2015-09-07: 2 [IU] via SUBCUTANEOUS
  Administered 2015-09-08: 3 [IU] via SUBCUTANEOUS
  Administered 2015-09-08: 2 [IU] via SUBCUTANEOUS
  Administered 2015-09-09 (×2): 5 [IU] via SUBCUTANEOUS
  Administered 2015-09-09: 3 [IU] via SUBCUTANEOUS
  Administered 2015-09-10 (×3): 5 [IU] via SUBCUTANEOUS
  Administered 2015-09-11 (×2): 7 [IU] via SUBCUTANEOUS
  Administered 2015-09-11 – 2015-09-12 (×2): 3 [IU] via SUBCUTANEOUS
  Administered 2015-09-12 (×2): 5 [IU] via SUBCUTANEOUS
  Administered 2015-09-13: 1 [IU] via SUBCUTANEOUS
  Administered 2015-09-13: 2 [IU] via SUBCUTANEOUS
  Administered 2015-09-13 – 2015-09-14 (×2): 3 [IU] via SUBCUTANEOUS

## 2015-09-05 MED ORDER — PIPERACILLIN-TAZOBACTAM IN DEX 2-0.25 GM/50ML IV SOLN
2.2500 g | Freq: Four times a day (QID) | INTRAVENOUS | Status: DC
  Administered 2015-09-05 – 2015-09-06 (×4): 2.25 g via INTRAVENOUS
  Filled 2015-09-05 (×7): qty 50

## 2015-09-05 MED ORDER — GUAIFENESIN ER 600 MG PO TB12
1200.0000 mg | ORAL_TABLET | Freq: Two times a day (BID) | ORAL | Status: DC
  Administered 2015-09-05 – 2015-09-14 (×17): 1200 mg via ORAL
  Filled 2015-09-05 (×18): qty 2

## 2015-09-05 MED ORDER — SODIUM CHLORIDE 0.9 % IV BOLUS (SEPSIS)
500.0000 mL | INTRAVENOUS | Status: AC
  Administered 2015-09-05: 500 mL via INTRAVENOUS

## 2015-09-05 MED ORDER — PANTOPRAZOLE SODIUM 40 MG IV SOLR
40.0000 mg | INTRAVENOUS | Status: DC
  Administered 2015-09-05 – 2015-09-09 (×5): 40 mg via INTRAVENOUS
  Filled 2015-09-05 (×5): qty 40

## 2015-09-05 MED ORDER — INSULIN ASPART 100 UNIT/ML ~~LOC~~ SOLN: 10.0000 [IU] | Freq: Once | SUBCUTANEOUS | Status: DC

## 2015-09-05 MED ORDER — MAGNESIUM SULFATE 50 % IJ SOLN
3.0000 g | Freq: Once | INTRAVENOUS | Status: AC
  Administered 2015-09-05: 3 g via INTRAVENOUS
  Filled 2015-09-05: qty 6

## 2015-09-05 MED ORDER — VANCOMYCIN HCL IN DEXTROSE 1-5 GM/200ML-% IV SOLN
1000.0000 mg | INTRAVENOUS | Status: DC
  Filled 2015-09-05: qty 200

## 2015-09-05 MED ORDER — SODIUM POLYSTYRENE SULFONATE 15 GM/60ML PO SUSP: 45.0000 g | Freq: Once | ORAL | Status: DC

## 2015-09-05 MED ORDER — VANCOMYCIN HCL IN DEXTROSE 1-5 GM/200ML-% IV SOLN: 1000.0000 mg | Freq: Once | INTRAVENOUS | Status: DC

## 2015-09-05 MED ORDER — ALUM & MAG HYDROXIDE-SIMETH 200-200-20 MG/5ML PO SUSP
30.0000 mL | Freq: Four times a day (QID) | ORAL | Status: DC | PRN
  Administered 2015-09-08 – 2015-09-09 (×2): 30 mL via ORAL
  Filled 2015-09-05 (×2): qty 30

## 2015-09-05 MED ORDER — SODIUM CHLORIDE 0.9 % IV SOLN
INTRAVENOUS | Status: DC
  Administered 2015-09-06: 21:00:00 via INTRAVENOUS

## 2015-09-05 MED ORDER — SODIUM CHLORIDE 0.9 % IV SOLN
1.0000 g | Freq: Once | INTRAVENOUS | Status: DC
  Filled 2015-09-05: qty 10

## 2015-09-05 MED ORDER — FLUCONAZOLE IN SODIUM CHLORIDE 200-0.9 MG/100ML-% IV SOLN
200.0000 mg | Freq: Once | INTRAVENOUS | Status: AC
  Administered 2015-09-05: 200 mg via INTRAVENOUS
  Filled 2015-09-05: qty 100

## 2015-09-05 MED ORDER — ONDANSETRON HCL 4 MG/2ML IJ SOLN: 4.0000 mg | Freq: Four times a day (QID) | INTRAMUSCULAR | Status: DC | PRN

## 2015-09-05 MED ORDER — FLUCONAZOLE IN SODIUM CHLORIDE 100-0.9 MG/50ML-% IV SOLN
100.0000 mg | INTRAVENOUS | Status: DC
  Administered 2015-09-06 – 2015-09-10 (×5): 100 mg via INTRAVENOUS
  Filled 2015-09-05 (×5): qty 50

## 2015-09-05 MED ORDER — SODIUM CHLORIDE 0.9 % IV BOLUS (SEPSIS)
1000.0000 mL | INTRAVENOUS | Status: AC
  Administered 2015-09-05: 1000 mL via INTRAVENOUS

## 2015-09-05 MED ORDER — PIPERACILLIN-TAZOBACTAM 3.375 G IVPB 30 MIN: 3.3750 g | Freq: Once | INTRAVENOUS | Status: DC

## 2015-09-05 MED ORDER — ASPIRIN 81 MG PO CHEW
81.0000 mg | CHEWABLE_TABLET | Freq: Every day | ORAL | Status: DC
  Administered 2015-09-05 – 2015-09-14 (×9): 81 mg via ORAL
  Filled 2015-09-05 (×9): qty 1

## 2015-09-05 MED ORDER — SODIUM CHLORIDE 0.9 % IJ SOLN
3.0000 mL | Freq: Two times a day (BID) | INTRAMUSCULAR | Status: DC
  Administered 2015-09-05 – 2015-09-11 (×6): 3 mL via INTRAVENOUS

## 2015-09-05 MED ORDER — SODIUM CHLORIDE 0.9 % IV BOLUS (SEPSIS): 1000.0000 mL | INTRAVENOUS | Status: AC

## 2015-09-05 MED ORDER — FLEET ENEMA 7-19 GM/118ML RE ENEM
1.0000 | ENEMA | Freq: Once | RECTAL | Status: DC | PRN
  Filled 2015-09-05: qty 1

## 2015-09-05 MED ORDER — VANCOMYCIN HCL 10 G IV SOLR
1500.0000 mg | Freq: Once | INTRAVENOUS | Status: AC
  Administered 2015-09-05: 1500 mg via INTRAVENOUS
  Filled 2015-09-05: qty 1500

## 2015-09-05 MED ORDER — SODIUM CHLORIDE 0.9 % IV BOLUS (SEPSIS)
500.0000 mL | Freq: Once | INTRAVENOUS | Status: AC
  Administered 2015-09-05: 500 mL via INTRAVENOUS

## 2015-09-05 MED ORDER — SORBITOL 70 % SOLN
30.0000 mL | Freq: Every day | Status: DC | PRN
Start: 2015-09-05 — End: 2015-09-14
  Filled 2015-09-05: qty 30

## 2015-09-05 MED ORDER — ACETAMINOPHEN 325 MG RE SUPP: 325.0000 mg | RECTAL | Status: DC | PRN

## 2015-09-05 MED ORDER — OXYCODONE HCL 5 MG PO TABS
5.0000 mg | ORAL_TABLET | ORAL | Status: DC | PRN
  Administered 2015-09-08 – 2015-09-10 (×3): 5 mg via ORAL
  Filled 2015-09-05 (×4): qty 1

## 2015-09-05 MED ORDER — DEXTROSE 50 % IV SOLN: 1.0000 | Freq: Once | INTRAVENOUS | Status: DC

## 2015-09-05 MED ORDER — ONDANSETRON HCL 4 MG PO TABS: 4.0000 mg | ORAL_TABLET | Freq: Four times a day (QID) | ORAL | Status: DC | PRN

## 2015-09-05 MED ORDER — ZOLPIDEM TARTRATE 5 MG PO TABS
5.0000 mg | ORAL_TABLET | Freq: Once | ORAL | Status: AC
  Administered 2015-09-05: 5 mg via ORAL
  Filled 2015-09-05: qty 1

## 2015-09-05 NOTE — ED Provider Notes (Signed)
CSN: [id number]     Arrival date & time 09/05/15  1403 History   First MD Initiated Contact with Patient 09/05/15 1406     Chief Complaint  Patient presents with  . Extremity Weakness     (Consider location/radiation/quality/duration/timing/severity/associated sxs/prior Treatment) HPI  Carlos Black is a 64 y.o. male with PMH significant for DM, HTN, CAD, MI, and CVA (left sided weakness) who presents with gradually worsening left sided weakness x 1 day.  He is normally ambulatory with a cane, and has been unable to walk for the past day.  Endorses history of cough x 1 week with nonbloody posttussive emesis.  Endorses numbness/tingling in the left side.  Denies fevers, chills, SOB, CP, N/D, abdominal pain, dysuria, hematuria, bloody stools, increased urinary frequency, or back pain. .  Past Medical History  Diagnosis Date  . Diabetes mellitus   . Hypertension   . Coronary artery disease   . Myocardial infarction ([place])   . Stroke [facility]) 2004   Past Surgical History  Procedure Laterality Date  . Basilar arterty stent     History reviewed. No pertinent family history. Social History  Substance Use Topics  . Smoking status: Never Smoker   . Smokeless tobacco: None  . Alcohol Use: No    Review of Systems All other systems negative unless otherwise stated in HPI   Allergies  Review of patient's allergies indicates no known allergies.  Home Medications   Prior to Admission medications   Medication Sig Start Date End Date Taking? Authorizing Provider  amLODipine (NORVASC) 10 MG tablet Take 10 mg by mouth daily.   Yes Historical Provider, MD  atorvastatin (LIPITOR) 10 MG tablet Take 10 mg by mouth daily.   Yes Historical Provider, MD  losartan (COZAAR) 100 MG tablet Take 100 mg by mouth daily. Patient states he is taking the losartan and Norvasc per patient   Yes Historical Provider, MD  metFORMIN (GLUCOPHAGE) 1000 MG tablet Take 1,000 mg by mouth 2 (two) times daily with a  meal.   Yes Historical Provider, MD  amLODipine (NORVASC) 10 MG tablet Take 1 tablet (10 mg total) by mouth daily. 12/25/11 12/24/12  Lavone Orn, MD  atorvastatin (LIPITOR) 10 MG tablet Take 1 tablet (10 mg total) by mouth daily. 12/25/11 12/24/12  Lavone Orn, MD  glimepiride (AMARYL) 4 MG tablet Take 1 tablet (4 mg total) by mouth daily with breakfast. 12/25/11 12/24/12  Lavone Orn, MD  metFORMIN (GLUCOPHAGE) 1000 MG tablet Take 1 tablet (1,000 mg total) by mouth 2 (two) times daily with a meal. 12/25/11 12/24/12  Lavone Orn, MD   BP 112/72 mmHg  Pulse 101  Temp(Src) 101.4 F (38.6 C) (Rectal)  Resp 21  Ht 5\' 5"  (1.651 m)  Wt 160 lb (72.576 kg)  BMI 26.63 kg/m2  SpO2 97% Physical Exam  Constitutional: He is oriented to person, place, and time. He appears well-developed and well-nourished.  HENT:  Head: Normocephalic and atraumatic.  Mouth/Throat: Oropharynx is clear and moist.  Eyes: Conjunctivae and EOM are normal. Pupils are equal, round, and reactive to light.  Neck: Normal range of motion. Neck supple.  Cardiovascular: Normal rate, regular rhythm, normal heart sounds and intact distal pulses.   No murmur heard. Pulmonary/Chest: Effort normal and breath sounds normal. No respiratory distress. He has no wheezes. He has no rales.  Abdominal: Soft. Bowel sounds are normal. He exhibits no distension. There is no tenderness. There is no rebound and no guarding.  Musculoskeletal: Normal range of motion.  Lymphadenopathy:    He has no cervical adenopathy.  Neurological: He is alert and oriented to person, place, and time. No cranial nerve deficit.  Mental Status:   AOx3 Cranial Nerves:  I-not tested  II-PERRLA  III, IV, VI-EOMs intact  V-temporal and masseter strength intact  VII-symmetrical facial movements intact, no facial droop  VIII-hearing grossly intact bilaterally  IX, X-gag intact  XI-strength of sternomastoid and trapezius muscles 5/5  XII-tongue midline Motor:    Good muscle bulk and tone  Strength 5/5 bilaterally in upper and lower, extremities with R>L.  Cerebellar--RAMs, finger to nose intact  Left Pronator drift Sensory:  Intact in upper and lower extremities      Skin: Skin is warm and dry.  Psychiatric: He has a normal mood and affect. His behavior is normal.    ED Course  Procedures (including critical care time) Labs Review Labs Reviewed  PROTIME-INR - Abnormal; Notable for the following:    Prothrombin Time 15.9 (*)    All other components within normal limits  CBC - Abnormal; Notable for the following:    Hemoglobin 11.2 (*)    HCT 34.3 (*)    MCV 75.9 (*)    MCH 24.8 (*)    RDW 16.1 (*)    All other components within normal limits  DIFFERENTIAL - Abnormal; Notable for the following:    Monocytes Absolute 1.6 (*)    All other components within normal limits  COMPREHENSIVE METABOLIC PANEL - Abnormal; Notable for the following:    Sodium 129 (*)    Potassium 5.9 (*)    Chloride 93 (*)    Glucose, Bld 150 (*)    BUN 37 (*)    Creatinine, Ser 3.44 (*)    Albumin 3.0 (*)    AST 50 (*)    Total Bilirubin 1.3 (*)    GFR calc non Af Amer 17 (*)    GFR calc Af Amer 20 (*)    All other components within normal limits  CULTURE, BLOOD (ROUTINE X 2)  CULTURE, BLOOD (ROUTINE X 2)  URINE CULTURE  APTT  URINALYSIS, ROUTINE W REFLEX MICROSCOPIC (NOT AT [facility])  SODIUM, URINE, RANDOM  CREATININE, URINE, RANDOM  I-STAT TROPOININ, ED  I-STAT CG4 LACTIC ACID, ED    Imaging Review Dg Chest 2 View  09/05/2015   CLINICAL DATA:  64 year old with acute onset of left lower extremity paresis/numbness which began earlier today. Fever. Current history of hypertension and diabetes. Prior stroke and MI.  EXAM: CHEST  2 VIEW  COMPARISON:  07/26/2004, 07/20/2004.  FINDINGS: AP semi-erect and lateral images were obtained. Chronic elevation of the right hemidiaphragm with chronic scar/atelectasis involving the right lower lobe and right  middle lobe, unchanged. Lungs otherwise clear. No localized airspace consolidation. No pleural effusions. No pneumothorax. Normal pulmonary vascularity.  Cardiac silhouette upper normal in size for AP technique. Thoracic aorta mildly atherosclerotic and tortuous, unchanged. Hilar and mediastinal contours otherwise unremarkable. Visualized bony thorax intact with only mild degenerative changes in the mid thoracic region.  IMPRESSION: 1.  No acute cardiopulmonary disease. 2. Stable chronic elevation of the right hemidiaphragm and chronic scar/atelectasis involving the right lower lobe and right middle lobe.   Electronically Signed   By: Evangeline Dakin M.D.   On: 09/05/2015 15:26   Ct Head Wo Contrast  09/05/2015   CLINICAL DATA:  Left-sided weakness from previous CVA, weakness has increased over the last day with difficulty walking  EXAM: CT HEAD WITHOUT CONTRAST  TECHNIQUE:  Contiguous axial images were obtained from the base of the skull through the vertex without intravenous contrast.  COMPARISON:  12/21/2011  FINDINGS: The bony calvarium is intact. Atrophic changes are noted. No findings to suggest acute hemorrhage, acute infarction or space-occupying mass lesion are noted.  IMPRESSION: Atrophic changes without acute abnormality.   Electronically Signed   By: Inez Catalina M.D.   On: 09/05/2015 16:37   I have personally reviewed and evaluated these images and lab results as part of my medical decision-making.   EKG Interpretation   Date/Time:  Tuesday September 05 2015 14:08:12 EDT Ventricular Rate:  113 PR Interval:    QRS Duration: 71 QT Interval:  362 QTC Calculation: 496 R Axis:   -36 Text Interpretation:  Sinus tachycardia with 1st degree A-V block Left  axis deviation Borderline low voltage, extremity leads Abnormal T,  consider ischemia, lateral leads Baseline wander in lead(s) V6 No  significant change since last tracing Confirmed by Debby Freiberg [phone])  on 09/05/2015 2:15:54 PM       MDM   Final diagnoses:  Weakness of left side of body  Fever, unspecified fever cause  Tachypnea  Tachycardia    Patient with PMH of CVA with residual left sided weakness presents with 1 day history of worsening left sided weakness   Vitals show tachycardia, tachypnea, and hypotension.  On exam, left pronator drift.  Strength and sensation intact, R>L in upper and lower extremities.  No cranial nerve deficits.  No facial droop.  Difficult to assess neurological deficit due to patient's previous left sided weakness.  Will perform stroke workup.  Concern for infectious etiology of weakness.  Will perform CXR and UA.  -Lactic acid 1.99, troponin 0. -CBC shows no leukocytosis -CMP shows AKI with Cr 3.4 -Blood cx pending -CXR shows no acute cardiopulmonary process.  CT head shows no acute abnormalities. -Unable to obtain urine via in and out cath.  Bladder scan 158 mL, no obstruction.    Patient remains febrile with rectal temp 101.4, mild tachycardia and tachypnea.  Concern for sepsis.  Admit to step down.      Gloriann Loan, PA-C 09/05/15 1743  Debby Freiberg, MD 09/09/15 [phone]

## 2015-09-05 NOTE — ED Notes (Signed)
Lynelle Smoke (daughter)- [phone]

## 2015-09-05 NOTE — ED Notes (Signed)
Unsuccessful attempt to In-Out cath by [facility] EMT. Elmyra Ricks RN present during cath and witnessed sterile technique. Peri-care provided by Darrold Span.  Unable to advance catheter and obtain urine.

## 2015-09-05 NOTE — ED Notes (Addendum)
Attempted a coude cath 14Fr, sterile technique and 2 nurses, without success. approx 3/4 of cath inserted then hit resistance. No blood noted on cath when removed. Pt tolerated well. Family updated.

## 2015-09-05 NOTE — Progress Notes (Signed)
md called with lab results  New orders received. Pt wants something to help sleep.  Pt a&o x 4.  Will have bedside swallow screen rechecked.  Will continue to monitor. Saunders Revel T

## 2015-09-05 NOTE — ED Notes (Signed)
Spoke with pt daughter on the phone to give update per pt verbal consent.

## 2015-09-05 NOTE — ED Notes (Signed)
Attempted report to 2H 

## 2015-09-05 NOTE — Progress Notes (Signed)
Paged md about meds not given in ED.  Lab drawn awaiting results.  Will hold calcium gluconate, kayax. , and dextrose, insulin for pending labs. Will continue to monitor Saunders Revel T

## 2015-09-05 NOTE — ED Notes (Signed)
Attempted to do an I&O cath with no success. Kayla, PA at bedside when attempt was done. Unable to advance catheter. Bladder scan shows 158 mL of urine in the bladder. Rash noted on scrotum.

## 2015-09-05 NOTE — ED Notes (Signed)
Pt has hx of CVA which caused left sided weakness. Today he states the weakness is worse and has c/o weakness in legs bilaterally from the calves to the ankle along with pain. Pt states he isn't able to stand or walk rt pain/weakness.

## 2015-09-05 NOTE — ED Notes (Signed)
Pt placed in gown and in bed. Pt monitored by pulse ox, bp cuff, and 12-lead. 

## 2015-09-05 NOTE — Progress Notes (Signed)
ANTIBIOTIC CONSULT NOTE - INITIAL  Pharmacy Consult for vancomycin / Zosyn Indication: rule out sepsis  No Known Allergies  Patient Measurements: Height: 5\' 5"  (165.1 cm) Weight: 160 lb (72.576 kg) IBW/kg (Calculated) : 61.5  Vital Signs: Temp: 101.4 F (38.6 C) (10/11 1515) Temp Source: Rectal (10/11 1515) BP: 122/79 mmHg (10/11 1700) Pulse Rate: 103 (10/11 1700) Intake/Output from previous day:   Intake/Output from this shift:    Labs:  Recent Labs  09/05/15 1500  WBC 5.9  HGB 11.2*  PLT 267  CREATININE 3.44*   Estimated Creatinine Clearance: 18.6 mL/min (by C-G formula based on Cr of 3.44). No results for input(s): VANCOTROUGH, VANCOPEAK, VANCORANDOM, GENTTROUGH, GENTPEAK, GENTRANDOM, TOBRATROUGH, TOBRAPEAK, TOBRARND, AMIKACINPEAK, AMIKACINTROU, AMIKACIN in the last 72 hours.   Microbiology: No results found for this or any previous visit (from the past 720 hour(s)).  Medical History: Past Medical History  Diagnosis Date  . Diabetes mellitus   . Hypertension   . Coronary artery disease   . Myocardial infarction ([place])   . Stroke [facility]) 2004    Medications:   (Not in a hospital admission) Assessment: 44 yoM presenting w/ gradually worsening left sided weakness and AKI.   LA 1.99, febrile to 101.4, tachy to 109, WBC 5.9, Na 129, K 5.9, SCr 3.44, CrCl <20  10/11 Vancomycin >> 10/11 Zosyn >>  10/11 BCx x2 >> 10/11 UCx >>  Goal of Therapy:  Vancomycin trough level 15-20 mcg/ml  Plan:  Vancomycin 1500 mg IV x1 (20 mg/kg load) Vancomycin 1000 mg IV q48h Zosyn 2.25 g IV q6h Expected duration 7 days with resolution of temperature and/or normalization of WBC Measure antibiotic drug levels at steady state Follow up culture results, changes in Renal fxn, LOT  Governor Specking, PharmD Clinical Pharmacy Resident Pager: [phone]  09/05/2015,5:13 PM

## 2015-09-05 NOTE — H&P (Signed)
[facility] History and Physical  Carlos Black AYO:[id number] DOB: 1951-06-15 DOA: 09/05/2015  Referring physician: Dr Dorie Rank PCP: [name], MD   Chief Complaint: left sided numbness and weakness  HPI: Carlos Black is a 64 y.o. male  Patient is a 19 y o with history of CVA with residual left sided weakness, DM, HTN, CAD who presented to ED with sudden onset of left lower extremity numbness and left-sided weakness on the day of admission. Patient stated that he woke up around 10 or 11 AM went to try to use the commode when he noticed a numbness in his left leg. Patient stated that he was extremely weak and unable to get off the commode and had to have 3 people help him up. Patient also endorses a cough or caring mostly at night over the past 2-3 weeks which is productive in nature with some brownish sputum. Patient denies any fevers, no chills, no nausea, no chest pain, no emesis, no shortness of breath, no abdominal pain, no diarrhea, no constipation, no dysuria, no melena, no hematemesis, no hematochezia, no visual changes, no facial droop, no slurred speech. Patient was in the emergency room CT head which was done was negative. Patient was noted in the ED to have systolic blood pressures in the 90s with a temp of 101.4 respiratory rate in the 30s and tachycardic with heart rate up to 109. Patient was given a liter bolus. Comprehensive metabolic profile obtained had a sodium of 129 potassium of 5.9 chloride of 93 BUN of 37 creatinine of 3.44 albumin of 3.0 AST of 50 bilirubin of 1.3 otherwise was within normal limits. Lactic acid level was 1.99. CBC had a hemoglobin of 11.2 otherwise was within normal limits. Chest x-ray which was done was negative for any acute infiltrate. Urinalysis was pending. Blood cultures were drawn. Patient was given a dose of IV vancomycin and IV Zosyn. [facility] were called to admit the patient for further evaluation and  management.    Review of Systems: As per history of present illness otherwise negative.  Constitutional:  No weight loss, night sweats, Fevers, chills, fatigue.  HEENT:  No headaches, Difficulty swallowing,Tooth/dental problems,Sore throat,  No sneezing, itching, ear ache, nasal congestion, post nasal drip,  Cardio-vascular:  No chest pain, Orthopnea, PND, swelling in lower extremities, anasarca, dizziness, palpitations  GI:  No heartburn, indigestion, abdominal pain, nausea, vomiting, diarrhea, change in bowel habits, loss of appetite  Resp:  No shortness of breath with exertion or at rest. No excess mucus, no productive cough, No non-productive cough, No coughing up of blood.No change in color of mucus.No wheezing.No chest wall deformity  Skin:  no rash or lesions.  GU:  no dysuria, change in color of urine, no urgency or frequency. No flank pain.  Musculoskeletal:  No joint pain or swelling. No decreased range of motion. No back pain.  Psych:  No change in mood or affect. No depression or anxiety. No memory loss.   Past Medical History  Diagnosis Date  . Diabetes mellitus   . Hypertension   . Coronary artery disease   . Myocardial infarction ([place])   . Stroke [facility]) 2004  . CAD (coronary artery disease) 09/05/2015   Past Surgical History  Procedure Laterality Date  . Basilar arterty stent     Social History:  reports that he has never smoked. He does not have any smokeless tobacco history on file. He reports that he does not drink alcohol or use illicit drugs.  No Known Allergies  Family History  Problem Relation Age of Onset  . Diabetes Mellitus II Mother   . Pneumonia Father   . Diabetes Father    mother deceased in her 76H from complications of diabetes. Father alive age 47 currently hospitalized the patient with pneumonia and also has diabetes.  Prior to Admission medications   Medication Sig Start Date End Date Taking? Authorizing Provider  amLODipine  (NORVASC) 10 MG tablet Take 10 mg by mouth daily.   Yes Historical Provider, MD  atorvastatin (LIPITOR) 10 MG tablet Take 10 mg by mouth daily.   Yes Historical Provider, MD  losartan (COZAAR) 100 MG tablet Take 100 mg by mouth daily. Patient states he is taking the losartan and Norvasc per patient   Yes Historical Provider, MD  metFORMIN (GLUCOPHAGE) 1000 MG tablet Take 1,000 mg by mouth 2 (two) times daily with a meal.   Yes Historical Provider, MD  amLODipine (NORVASC) 10 MG tablet Take 1 tablet (10 mg total) by mouth daily. 12/25/11 12/24/12  Lavone Orn, MD  atorvastatin (LIPITOR) 10 MG tablet Take 1 tablet (10 mg total) by mouth daily. 12/25/11 12/24/12  Lavone Orn, MD  glimepiride (AMARYL) 4 MG tablet Take 1 tablet (4 mg total) by mouth daily with breakfast. 12/25/11 12/24/12  Lavone Orn, MD  metFORMIN (GLUCOPHAGE) 1000 MG tablet Take 1 tablet (1,000 mg total) by mouth 2 (two) times daily with a meal. 12/25/11 12/24/12  Lavone Orn, MD   Physical Exam: Filed Vitals:   09/05/15 1800 09/05/15 1815 09/05/15 1830 09/05/15 1845  BP: 122/73 122/72 108/68 112/66  Pulse: 102 99 93 94  Temp:      TempSrc:      Resp: 19 28 23 30   Height:      Weight:      SpO2: 98% 96% 95% 93%    Wt Readings from Last 3 Encounters:  09/05/15 72.576 kg (160 lb)  12/22/11 73.3 kg (161 lb 9.6 oz)    General:   well-developed gentleman lying on gurney in no acute cardiopulmonary distress.  Eyes: PERRLA, EOMI, normal lids, irises & conjunctiva ENT: grossly normal hearing, lips. Tongue with whitish exudate. Extremely dry mucous membranes.  Neck: no LAD, masses or thyromegaly Cardiovascular: Tachycardic, no m/r/g. No LE edema. Telemetry: Sinus tachycardia  Respiratory: CTA bilaterally, no w/r/r. Normal respiratory effort. Abdomen: soft, ntnd, positive bowel sounds, no rebound, no guarding  Skin: no rash or induration seen on limited exam Musculoskeletal: grossly normal tone 5/5 right upper extremity and right  lower extremity strength. 3-4/5 left upper extremity and left lower extremity strength. Psychiatric: grossly normal mood and affect, speech fluent and appropriate Neurologic:  alert and oriented 3. Cranial nerves II through XII are grossly intact. No focal deficits. Sensation is intact. Visual fields are intact. Unable to elicit reflexes symmetrically and diffusely. Gait not tested secondary to safety.           Labs on Admission:  Basic Metabolic Panel:  Recent Labs Lab 09/05/15 1500 09/05/15 1837  NA 129*  --   K 5.9*  --   CL 93*  --   CO2 25  --   GLUCOSE 150*  --   BUN 37*  --   CREATININE 3.44*  --   CALCIUM 9.2  --   MG  --  1.6*   Liver Function Tests:  Recent Labs Lab 09/05/15 1500  AST 50*  ALT 21  ALKPHOS 79  BILITOT 1.3*  PROT 7.3  ALBUMIN  3.0*   No results for input(s): LIPASE, AMYLASE in the last 168 hours. No results for input(s): AMMONIA in the last 168 hours. CBC:  Recent Labs Lab 09/05/15 1500  WBC 5.9  NEUTROABS 3.4  HGB 11.2*  HCT 34.3*  MCV 75.9*  PLT 267   Cardiac Enzymes:  Recent Labs Lab 09/05/15 1837  TROPONINI <0.03    BNP (last 3 results) No results for input(s): BNP in the last 8760 hours.  ProBNP (last 3 results) No results for input(s): PROBNP in the last 8760 hours.  CBG: No results for input(s): GLUCAP in the last 168 hours.  Radiological Exams on Admission: Dg Chest 2 View  09/05/2015   CLINICAL DATA:  64 year old with acute onset of left lower extremity paresis/numbness which began earlier today. Fever. Current history of hypertension and diabetes. Prior stroke and MI.  EXAM: CHEST  2 VIEW  COMPARISON:  07/26/2004, 07/20/2004.  FINDINGS: AP semi-erect and lateral images were obtained. Chronic elevation of the right hemidiaphragm with chronic scar/atelectasis involving the right lower lobe and right middle lobe, unchanged. Lungs otherwise clear. No localized airspace consolidation. No pleural effusions. No  pneumothorax. Normal pulmonary vascularity.  Cardiac silhouette upper normal in size for AP technique. Thoracic aorta mildly atherosclerotic and tortuous, unchanged. Hilar and mediastinal contours otherwise unremarkable. Visualized bony thorax intact with only mild degenerative changes in the mid thoracic region.  IMPRESSION: 1.  No acute cardiopulmonary disease. 2. Stable chronic elevation of the right hemidiaphragm and chronic scar/atelectasis involving the right lower lobe and right middle lobe.   Electronically Signed   By: Evangeline Dakin M.D.   On: 09/05/2015 15:26   Ct Head Wo Contrast  09/05/2015   CLINICAL DATA:  Left-sided weakness from previous CVA, weakness has increased over the last day with difficulty walking  EXAM: CT HEAD WITHOUT CONTRAST  TECHNIQUE: Contiguous axial images were obtained from the base of the skull through the vertex without intravenous contrast.  COMPARISON:  12/21/2011  FINDINGS: The bony calvarium is intact. Atrophic changes are noted. No findings to suggest acute hemorrhage, acute infarction or space-occupying mass lesion are noted.  IMPRESSION: Atrophic changes without acute abnormality.   Electronically Signed   By: Inez Catalina M.D.   On: 09/05/2015 16:37    EKG: Independently reviewed. T-wave inversions in leads 1, aVL. Q waves in leads 3 and aVF similar to prior EKG.  Assessment/Plan Principal Problem:   SIRS (systemic inflammatory response syndrome) (HCC) Active Problems:   Diabetes mellitus (HCC)   Weakness   Hypotension   Hyperkalemia   ARF (acute renal failure) (HCC)   Dehydration   Left sided numbness   H/O: CVA (cerebrovascular accident)   CAD (coronary artery disease)   Oral thrush   Dysphagia   Anemia   Hyponatremia  #1 systemic inflammatory response syndrome Patient on admission the ED noted to have a fever 101.4, tachycardic, tachypneic, and borderline hypotension. Chest x-ray negative for any acute infiltrates. Lactic acid level at  1.99. Urinalysis was pending as patient could not give a urine sample and nursing unable to place Foley catheter including a coud. Will admit the patient to the step down unit. Will place on the sepsis pathway. Panculture. Check a pro-calcitonin. Repeat chest x-ray in the morning. Placed empirically on IV vancomycin and IV Zosyn. Follow.  #2 left-sided numbness and weakness Likely metabolic in nature secondary to problem #1. Infectious source not known at this time. Patient with prior history of CVA risk factors of diabetes hypertension  coronary artery disease. Patient has been pancultured. See problem #1. Will check MRI of the head. If MRI is positive for an acute CVA will need to consult with neurology. Follow.  #3 acute renal failure Likely secondary to a prerenal azotemia. Will check a fractional excretion of sodium. Check a renal ultrasound. Patient unable to give urine at this time likely secondary to dehydration. Nurse and emergency room unable to place a Foley catheter including a coud. Will hydrate with IV fluids and monitor urine output. If still no urine output in the morning will likely need a urological consultation for Foley catheter placement.  #4 hyperkalemia Likely secondary to problem #3. EKG with no peaked T waves. Will give patient calcium gluconate, D50 1 amp, NovoLog 10 units IV 1, Kayexalate enema. Follow.  #5 dehydration IV fluids.  #6 oral thrush Place on Diflucan.  #7 dysphagia Per nursing patient failed swallow evaluation. CT head was negative. Will check MRI of the head. Consult speech therapy for swallow evaluation. We'll keep nothing by mouth for now.  #8 hyponatremia Likely secondary to hypovolemic hyponatremia as patient is noted to have dry mucous membranes and systolic blood pressure was in the 90s on admission. Check a fractional excretion of sodium. Check a TSH. Chest x-ray is negative. CT head is negative. IV fluids. If no improvement with hyponatremia may  consider checking urine osmolalities and serum osmolalities. Will follow for now.  #9 diabetes mellitus Check a hemoglobin A1c. Hold oral hypoglycemic agents. Place on sliding scale insulin.  #10 history of CVA Patient presented with worsening left-sided numbness and weakness. CT head was negative. Patient's symptoms likely metabolic in nature. Check MRI of the head. Place on aspirin for secondary stroke prevention.  #11 hyperlipidemia Once tolerating oral intake will resume home regimen of statin.  #12 hypertension Hold antihypertensives for now. Follow.  #13 prophylaxis PPI for GI prophylaxis. Heparin for DVT prophylaxis.   Code Status: DNR DVT Prophylaxis: heparin Family Communication: updated patient and daughter at bedside. Disposition Plan: Admit  Time spent: [place] MD [facility] Pager [phone]

## 2015-09-05 NOTE — Progress Notes (Signed)
Md paged.  Pt passed bedside swallow screen. Awaiting orders.  Will continue to monitor.Carlos Black

## 2015-09-06 ENCOUNTER — Inpatient Hospital Stay (HOSPITAL_COMMUNITY): Payer: Medicare HMO

## 2015-09-06 ENCOUNTER — Encounter (HOSPITAL_COMMUNITY): Payer: Self-pay | Admitting: General Practice

## 2015-09-06 DIAGNOSIS — D500 Iron deficiency anemia secondary to blood loss (chronic): Secondary | ICD-10-CM

## 2015-09-06 DIAGNOSIS — N170 Acute kidney failure with tubular necrosis: Secondary | ICD-10-CM

## 2015-09-06 DIAGNOSIS — N179 Acute kidney failure, unspecified: Secondary | ICD-10-CM

## 2015-09-06 DIAGNOSIS — R6510 Systemic inflammatory response syndrome (SIRS) of non-infectious origin without acute organ dysfunction: Secondary | ICD-10-CM

## 2015-09-06 HISTORY — DX: Acute kidney failure, unspecified: N17.9

## 2015-09-06 HISTORY — DX: Systemic inflammatory response syndrome (sirs) of non-infectious origin without acute organ dysfunction: R65.10

## 2015-09-06 LAB — GLUCOSE, CAPILLARY
GLUCOSE-CAPILLARY: 90 mg/dL (ref 65–99)
GLUCOSE-CAPILLARY: 105 mg/dL — AB (ref 65–99)
Glucose-Capillary: 79 mg/dL (ref 65–99)
Glucose-Capillary: 99 mg/dL (ref 65–99)

## 2015-09-06 LAB — COMPREHENSIVE METABOLIC PANEL
ALBUMIN: 2.4 g/dL — AB (ref 3.5–5.0)
ALK PHOS: 61 U/L (ref 38–126)
ALT: 19 U/L (ref 17–63)
ANION GAP: 15 (ref 5–15)
AST: 34 U/L (ref 15–41)
BUN: 31 mg/dL — ABNORMAL HIGH (ref 6–20)
CALCIUM: 8.4 mg/dL — AB (ref 8.9–10.3)
CHLORIDE: 100 mmol/L — AB (ref 101–111)
CO2: 20 mmol/L — AB (ref 22–32)
Creatinine, Ser: 3.14 mg/dL — ABNORMAL HIGH (ref 0.61–1.24)
GFR calc Af Amer: 22 mL/min — ABNORMAL LOW (ref >60)
GFR calc non Af Amer: 19 mL/min — ABNORMAL LOW (ref >60)
GLUCOSE: 116 mg/dL — AB (ref 65–99)
Potassium: 5 mmol/L (ref 3.5–5.1)
SODIUM: 135 mmol/L (ref 135–145)
Total Bilirubin: 1 mg/dL (ref 0.3–1.2)
Total Protein: 6.3 g/dL — ABNORMAL LOW (ref 6.5–8.1)

## 2015-09-06 LAB — LACTIC ACID, PLASMA: LACTIC ACID, VENOUS: 1.7 mmol/L (ref 0.5–2.0)

## 2015-09-06 LAB — CBC
HEMATOCRIT: 30.7 % — AB (ref 39.0–52.0)
Hemoglobin: 9.8 g/dL — ABNORMAL LOW (ref 13.0–17.0)
MCH: 24.7 pg — ABNORMAL LOW (ref 26.0–34.0)
MCHC: 31.9 g/dL (ref 30.0–36.0)
MCV: 77.3 fL — AB (ref 78.0–100.0)
Platelets: 205 10*3/uL (ref 150–400)
RBC: 3.97 MIL/uL — AB (ref 4.22–5.81)
RDW: 16.2 % — ABNORMAL HIGH (ref 11.5–15.5)
WBC: 4.2 10*3/uL (ref 4.0–10.5)

## 2015-09-06 LAB — MAGNESIUM: MAGNESIUM: 2.3 mg/dL (ref 1.7–2.4)

## 2015-09-06 MED ORDER — ACETAMINOPHEN 325 MG PO TABS
325.0000 mg | ORAL_TABLET | ORAL | Status: DC | PRN
  Administered 2015-09-06 – 2015-09-07 (×2): 325 mg via ORAL
  Filled 2015-09-06 (×2): qty 1

## 2015-09-06 MED ORDER — PIPERACILLIN-TAZOBACTAM 3.375 G IVPB
3.3750 g | Freq: Three times a day (TID) | INTRAVENOUS | Status: DC
  Administered 2015-09-06 – 2015-09-09 (×8): 3.375 g via INTRAVENOUS
  Filled 2015-09-06 (×10): qty 50

## 2015-09-06 NOTE — Progress Notes (Addendum)
[facility] PROGRESS NOTE  Carlos Black WRU:[id number] DOB: 08/08/1951 DOA: 09/05/2015 PCP: [name], MD  Length of stay: 1   Assessment/Plan: Principal Problem:   SIRS (systemic inflammatory response syndrome) (HCC) Active Problems:   Diabetes mellitus (HCC)   Weakness   Hypotension   Hyperkalemia   ARF (acute renal failure) (HCC)   Dehydration   Left sided numbness   H/O: CVA (cerebrovascular accident)   CAD (coronary artery disease)   Oral thrush   Dysphagia   Anemia   Hyponatremia   Weakness of left side of body    #1 systemic inflammatory response syndrome Patient on admission  noted to have a fever 101.4, tachycardic, tachypneic, and borderline hypotension. Chest x-ray negative for any acute infiltrates. Lactic acid level at 1.99. Urinalysis shows UTI,. Transfer to telemetry as improved overnight .  Marland Kitchen Follow Panculture.   pro-calcitonin 0.86. Cannot rule out aspiration at this time, continue on   IV vancomycin and IV Zosyn.   #2 left-sided numbness and weakness Likely metabolic in nature secondary to problem #1. Infectious source not known at this time. Patient with prior history of CVA risk factors of diabetes hypertension coronary artery disease.    MRI negative for acute CVA , shows old right pontine infarct Will order MRI lumbar spine as pt unable to walk and has left leg weakness   #3 acute renal failure Likely secondary to a prerenal azotemia. Renal ultrasound shows a large prostrate, continue IV fluids and monitor urine output. Insert foley catheter as pt may have BOO   #4 hyperkalemia Likely secondary to problem #3. EKG with no peaked T waves. S/p calcium gluconate, D50 1 amp, NovoLog 10 units IV 1, Kayexalate enema. Follow.  #5 dehydration IV fluids.  #6 oral thrush Place on Diflucan.  #7 dysphagia Consult speech therapy for swallow evaluation. We'll keep nothing by mouth for now.  #8 hyponatremia Likely secondary to  hypovolemic hyponatremia as patient is noted to have dry mucous membranes and systolic blood pressure was in the 90s on admission.   Will follow for now.  #9 diabetes mellitus   hemoglobin A1c pending . Hold oral hypoglycemic agents. Place on sliding scale insulin.  #10 history of CVA Patient presented with worsening left-sided numbness and weakness. CT head was negative. Patient's symptoms likely metabolic in nature.  MRI of the head negative . Cont  aspirin for secondary stroke prevention.  #11 hyperlipidemia Once tolerating oral intake will resume home regimen of statin.  #12 hypertension Hold antihypertensives for now. Follow.   DVT prophylaxsis   Code Status:      Code Status Orders        Start     Ordered   09/05/15 1958  Do not attempt resuscitation (DNR)   Continuous    Question Answer Comment  In the event of cardiac or respiratory ARREST Do not call a "code blue"   In the event of cardiac or respiratory ARREST Do not perform Intubation, CPR, defibrillation or ACLS   In the event of cardiac or respiratory ARREST Use medication by any route, position, wound care, and other measures to relive pain and suffering. May use oxygen, suction and manual treatment of airway obstruction as needed for comfort.      09/05/15 1957     Family Communication: family updated about patient's clinical progress Disposition Plan:  As above    Brief narrative: 64 y o with history of CVA with residual left sided weakness,  DM, HTN, CAD who presented to ED with sudden onset of left lower extremity numbness and left-sided weakness on the day of admission. Patient stated that he woke up around 10 or 11 AM went to try to use the commode when he noticed a numbness in his left leg. Patient stated that he was extremely weak and unable to get off the commode and had to have 3 people help him up. Patient also endorses a cough or caring mostly at night over the past 2-3 weeks which is productive in  nature with some brownish sputum. Patient denies any fevers, no chills, no nausea, no chest pain, no emesis, no shortness of breath, no abdominal pain, no diarrhea, no constipation, no dysuria, no melena, no hematemesis, no hematochezia, no visual changes, no facial droop, no slurred speech. Patient was in the emergency room CT head which was done was negative. Patient was noted in the ED to have systolic blood pressures in the 90s with a temp of 101.4 respiratory rate in the 30s and tachycardic with heart rate up to 109. Patient was given a liter bolus. Comprehensive metabolic profile obtained had a sodium of 129 potassium of 5.9 chloride of 93 BUN of 37 creatinine of 3.44 albumin of 3.0 AST of 50 bilirubin of 1.3 otherwise was within normal limits. Lactic acid level was 1.99. CBC had a hemoglobin of 11.2 otherwise was within normal limits. Chest x-ray which was done was negative for any acute infiltrate. Urinalysis was pending. Blood cultures were drawn. Patient was given a dose of IV vancomycin and IV Zosyn. [facility] were called to admit the patient for further evaluation and management.  Consultants:  None   Procedures None  Antibiotics: Anti-infectives    Start     Dose/Rate Route Frequency Ordered Stop   09/07/15 1730  vancomycin (VANCOCIN) IVPB 1000 mg/200 mL premix     1,000 mg 200 mL/hr over 60 Minutes Intravenous Every 48 hours 09/05/15 1712     09/06/15 2200  piperacillin-tazobactam (ZOSYN) IVPB 3.375 g     3.375 g 12.5 mL/hr over 240 Minutes Intravenous 3 times per day 09/06/15 1304     09/06/15 1000  fluconazole (DIFLUCAN) IVPB 100 mg     100 mg 50 mL/hr over 60 Minutes Intravenous Every 24 hours 09/05/15 1947     09/05/15 2200  fluconazole (DIFLUCAN) IVPB 200 mg     200 mg 100 mL/hr over 60 Minutes Intravenous  Once 09/05/15 1946 09/05/15 2316   09/05/15 1800  piperacillin-tazobactam (ZOSYN) IVPB 3.375 g  Status:  Discontinued     3.375 g 100 mL/hr over 30 Minutes  Intravenous  Once 09/05/15 1758 09/05/15 1800   09/05/15 1800  vancomycin (VANCOCIN) IVPB 1000 mg/200 mL premix  Status:  Discontinued     1,000 mg 200 mL/hr over 60 Minutes Intravenous  Once 09/05/15 1758 09/05/15 1759   09/05/15 1730  piperacillin-tazobactam (ZOSYN) IVPB 2.25 g  Status:  Discontinued     2.25 g 100 mL/hr over 30 Minutes Intravenous 4 times per day 09/05/15 1712 09/06/15 1304   09/05/15 1730  vancomycin (VANCOCIN) IVPB 1000 mg/200 mL premix  Status:  Discontinued     1,000 mg 200 mL/hr over 60 Minutes Intravenous  Once 09/05/15 1712 09/05/15 1715   09/05/15 1730  vancomycin (VANCOCIN) 1,500 mg in sodium chloride 0.9 % 500 mL IVPB     1,500 mg 250 mL/hr over 120 Minutes Intravenous  Once 09/05/15 1715 09/05/15 2026  HPI/Subjective: Left leg numbness and inability to walk   Objective: Filed Vitals:   09/06/15 0900 09/06/15 1000 09/06/15 1140 09/06/15 1200  BP: 122/69 126/69 104/69 131/75  Pulse: 92 90 95 98  Temp:   99.4 F (37.4 C)   TempSrc:   Oral   Resp: 33 35 19 21  Height:      Weight:      SpO2: 93% 94% 94% 97%    Intake/Output Summary (Last 24 hours) at 09/06/15 1339 Last data filed at 09/06/15 1302  Gross per 24 hour  Intake   1325 ml  Output    950 ml  Net    375 ml    Exam:  General: No acute respiratory distress Lungs: Clear to auscultation bilaterally without wheezes or crackles Cardiovascular: Regular rate and rhythm without murmur gallop or rub normal S1 and S2 Abdomen: Nontender, nondistended, soft, bowel sounds positive, no rebound, no ascites, no appreciable mass Extremities: No significant cyanosis, clubbing, or edema bilateral lower extremities     Data Review   Micro Results Recent Results (from the past 240 hour(s))  Blood culture (routine x 2)     Status: None (Preliminary result)   Collection Time: 09/05/15  3:00 PM  Result Value Ref Range Status   Specimen Description BLOOD RIGHT ARM  Final   Special  Requests BOTTLES DRAWN AEROBIC AND ANAEROBIC 5CC  Final   Culture NO GROWTH < 24 HOURS  Final   Report Status PENDING  Incomplete  Blood culture (routine x 2)     Status: None (Preliminary result)   Collection Time: 09/05/15  4:43 PM  Result Value Ref Range Status   Specimen Description BLOOD LEFT HAND  Final   Special Requests BOTTLES DRAWN AEROBIC AND ANAEROBIC 4CC  Final   Culture NO GROWTH < 24 HOURS  Final   Report Status PENDING  Incomplete  MRSA PCR Screening     Status: None   Collection Time: 09/05/15  8:28 PM  Result Value Ref Range Status   MRSA by PCR NEGATIVE NEGATIVE Final    Comment:        The GeneXpert MRSA Assay (FDA approved for NASAL specimens only), is one component of a comprehensive MRSA colonization surveillance program. It is not intended to diagnose MRSA infection nor to guide or monitor treatment for MRSA infections.   Urine culture     Status: None (Preliminary result)   Collection Time: 09/05/15 10:20 PM  Result Value Ref Range Status   Specimen Description URINE, RANDOM  Final   Special Requests NONE  Final   Culture NO GROWTH < 12 HOURS  Final   Report Status PENDING  Incomplete    Radiology Reports Dg Chest 2 View  09/05/2015  CLINICAL DATA:  64 year old with acute onset of left lower extremity paresis/numbness which began earlier today. Fever. Current history of hypertension and diabetes. Prior stroke and MI. EXAM: CHEST  2 VIEW COMPARISON:  07/26/2004, 07/20/2004. FINDINGS: AP semi-erect and lateral images were obtained. Chronic elevation of the right hemidiaphragm with chronic scar/atelectasis involving the right lower lobe and right middle lobe, unchanged. Lungs otherwise clear. No localized airspace consolidation. No pleural effusions. No pneumothorax. Normal pulmonary vascularity. Cardiac silhouette upper normal in size for AP technique. Thoracic aorta mildly atherosclerotic and tortuous, unchanged. Hilar and mediastinal contours otherwise  unremarkable. Visualized bony thorax intact with only mild degenerative changes in the mid thoracic region. IMPRESSION: 1.  No acute cardiopulmonary disease. 2. Stable chronic elevation of the  right hemidiaphragm and chronic scar/atelectasis involving the right lower lobe and right middle lobe. Electronically Signed   By: Evangeline Dakin M.D.   On: 09/05/2015 15:26   Ct Head Wo Contrast  09/05/2015  CLINICAL DATA:  Left-sided weakness from previous CVA, weakness has increased over the last day with difficulty walking EXAM: CT HEAD WITHOUT CONTRAST TECHNIQUE: Contiguous axial images were obtained from the base of the skull through the vertex without intravenous contrast. COMPARISON:  12/21/2011 FINDINGS: The bony calvarium is intact. Atrophic changes are noted. No findings to suggest acute hemorrhage, acute infarction or space-occupying mass lesion are noted. IMPRESSION: Atrophic changes without acute abnormality. Electronically Signed   By: Inez Catalina M.D.   On: 09/05/2015 16:37   Mr Brain Wo Contrast  09/06/2015  CLINICAL DATA:  LEFT lower extremity numbness and LEFT-sided weakness for 2 days. Night cough for 2-3 weeks. History of stroke and LEFT-sided weakness, diabetes, hypertension, coronary artery disease. EXAM: MRI HEAD WITHOUT CONTRAST TECHNIQUE: Multiplanar, multiecho pulse sequences of the brain and surrounding structures were obtained without intravenous contrast. COMPARISON:  CT head September 05, 2015 at 1626 hours FINDINGS: Multiple sequences are mild or moderately motion degraded. No reduced diffusion to suggest acute ischemia. No susceptibility artifact to suggest hemorrhage. Ventricles and sulci are normal for patient's age. Old RIGHT ventral pons infarct. Old RIGHT basal ganglia lacunar infarct. Mild RIGHT cerebral peduncle volume loss compatible with wallerian degeneration. Mild white matter changes compatible with chronic small vessel ischemic disease, decreased sensitivity due to patient  motion. No abnormal extra-axial fluid collections. Dolicoectatic intracranial vessel flow voids seen at the skull base. Ocular globes and orbital contents are grossly normal though motion degraded. Paranasal sinuses and mastoid air cells are well aerated. No abnormal sellar expansion. No is abnormal cerebellar tonsillar descent below the foramen magnum. No suspicious bone marrow signal. IMPRESSION: No acute intracranial process on this motion degraded examination. Involutional changes and mild chronic small vessel ischemic disease. Old RIGHT pontine infarct. Old RIGHT basal ganglia lacunar infarct. Electronically Signed   By: Elon Alas M.D.   On: 09/06/2015 02:55   US Renal  09/06/2015  CLINICAL DATA:  Acute renal failure. EXAM: RENAL / URINARY TRACT ULTRASOUND COMPLETE COMPARISON:  None. FINDINGS: Right Kidney: Length: 9.6 cm. Heterogeneous increased parenchymal echotexture with parenchymal thinning suggesting chronic medical renal disease. No hydronephrosis or solid mass. Left Kidney: Length: 9.8 cm. Heterogeneous increased parenchymal echotexture likely indicating chronic medical renal disease. Appears to be circumscribed hypoechoic appearance of the lower pole of the left kidney. This may indicate edema versus abscess. No hydronephrosis or solid mass identified. Bladder: No bladder wall thickening or filling defect. Bilateral urine flow jets are demonstrated on color flow Doppler imaging. Incidental note of mildly enlarged prostate gland measuring 3.6 x 3.6 x 3.3 cm. IMPRESSION: Heterogeneous parenchymal echotexture bilaterally suggesting chronic medical renal disease. Hypoechoic appearance of the lower pole left kidney could indicate edema, pyelonephritis, or abscess. No hydronephrosis in either kidney. Mild prostate enlargement. Electronically Signed   By: Lucienne Capers M.D.   On: 09/06/2015 03:19   Portable Chest 1 View  09/06/2015  CLINICAL DATA:  Fever. EXAM: PORTABLE CHEST 1 VIEW  COMPARISON:  09/05/2015.  07/20/2004. FINDINGS: Mediastinum and hilar structures are normal. Heart size stable. Persistent right lower lobe subsegmental atelectasis and/or scarring. No pleural effusion pneumothorax. IMPRESSION: Stable right base subsegmental atelectasis and/or scarring. No acute cardiopulmonary disease. Electronically Signed   By: Marcello Moores  Register   On: 09/06/2015 07:19  CBC  Recent Labs Lab 09/05/15 1500 09/06/15 0242  WBC 5.9 4.2  HGB 11.2* 9.8*  HCT 34.3* 30.7*  PLT 267 205  MCV 75.9* 77.3*  MCH 24.8* 24.7*  MCHC 32.7 31.9  RDW 16.1* 16.2*  LYMPHSABS 0.7  --   MONOABS 1.6*  --   EOSABS 0.1  --   BASOSABS 0.0  --     Chemistries   Recent Labs Lab 09/05/15 1500 09/05/15 1837 09/05/15 2043 09/06/15 0242  NA 129*  --  132* 135  K 5.9*  --  5.0 5.0  CL 93*  --  98* 100*  CO2 25  --  23 20*  GLUCOSE 150*  --  117* 116*  BUN 37*  --  32* 31*  CREATININE 3.44*  --  3.24* 3.14*  CALCIUM 9.2  --  8.2* 8.4*  MG  --  1.6* 1.8 2.3  AST 50*  --   --  34  ALT 21  --   --  19  ALKPHOS 79  --   --  61  BILITOT 1.3*  --   --  1.0   ------------------------------------------------------------------------------------------------------------------ estimated creatinine clearance is 22.5 mL/min (by C-G formula based on Cr of 3.14). ------------------------------------------------------------------------------------------------------------------ No results for input(s): HGBA1C in the last 72 hours. ------------------------------------------------------------------------------------------------------------------ No results for input(s): CHOL, HDL, LDLCALC, TRIG, CHOLHDL, LDLDIRECT in the last 72 hours. ------------------------------------------------------------------------------------------------------------------  Recent Labs  09/05/15 2043  TSH 2.894    ------------------------------------------------------------------------------------------------------------------ No results for input(s): VITAMINB12, FOLATE, FERRITIN, TIBC, IRON, RETICCTPCT in the last 72 hours.  Coagulation profile  Recent Labs Lab 09/05/15 1500  INR 1.26    No results for input(s): DDIMER in the last 72 hours.  Cardiac Enzymes  Recent Labs Lab 09/05/15 1837  TROPONINI <0.03   ------------------------------------------------------------------------------------------------------------------ Invalid input(s): POCBNP   CBG:  Recent Labs Lab 09/05/15 2012 09/05/15 2332 09/06/15 0650 09/06/15 1205  GLUCAP 120* 109* 90 79       Studies: Dg Chest 2 View  09/05/2015  CLINICAL DATA:  64 year old with acute onset of left lower extremity paresis/numbness which began earlier today. Fever. Current history of hypertension and diabetes. Prior stroke and MI. EXAM: CHEST  2 VIEW COMPARISON:  07/26/2004, 07/20/2004. FINDINGS: AP semi-erect and lateral images were obtained. Chronic elevation of the right hemidiaphragm with chronic scar/atelectasis involving the right lower lobe and right middle lobe, unchanged. Lungs otherwise clear. No localized airspace consolidation. No pleural effusions. No pneumothorax. Normal pulmonary vascularity. Cardiac silhouette upper normal in size for AP technique. Thoracic aorta mildly atherosclerotic and tortuous, unchanged. Hilar and mediastinal contours otherwise unremarkable. Visualized bony thorax intact with only mild degenerative changes in the mid thoracic region. IMPRESSION: 1.  No acute cardiopulmonary disease. 2. Stable chronic elevation of the right hemidiaphragm and chronic scar/atelectasis involving the right lower lobe and right middle lobe. Electronically Signed   By: Evangeline Dakin M.D.   On: 09/05/2015 15:26   Ct Head Wo Contrast  09/05/2015  CLINICAL DATA:  Left-sided weakness from previous CVA, weakness has  increased over the last day with difficulty walking EXAM: CT HEAD WITHOUT CONTRAST TECHNIQUE: Contiguous axial images were obtained from the base of the skull through the vertex without intravenous contrast. COMPARISON:  12/21/2011 FINDINGS: The bony calvarium is intact. Atrophic changes are noted. No findings to suggest acute hemorrhage, acute infarction or space-occupying mass lesion are noted. IMPRESSION: Atrophic changes without acute abnormality. Electronically Signed   By: Inez Catalina M.D.   On: 09/05/2015 16:37  Mr Brain Wo Contrast  09/06/2015  CLINICAL DATA:  LEFT lower extremity numbness and LEFT-sided weakness for 2 days. Night cough for 2-3 weeks. History of stroke and LEFT-sided weakness, diabetes, hypertension, coronary artery disease. EXAM: MRI HEAD WITHOUT CONTRAST TECHNIQUE: Multiplanar, multiecho pulse sequences of the brain and surrounding structures were obtained without intravenous contrast. COMPARISON:  CT head September 05, 2015 at 1626 hours FINDINGS: Multiple sequences are mild or moderately motion degraded. No reduced diffusion to suggest acute ischemia. No susceptibility artifact to suggest hemorrhage. Ventricles and sulci are normal for patient's age. Old RIGHT ventral pons infarct. Old RIGHT basal ganglia lacunar infarct. Mild RIGHT cerebral peduncle volume loss compatible with wallerian degeneration. Mild white matter changes compatible with chronic small vessel ischemic disease, decreased sensitivity due to patient motion. No abnormal extra-axial fluid collections. Dolicoectatic intracranial vessel flow voids seen at the skull base. Ocular globes and orbital contents are grossly normal though motion degraded. Paranasal sinuses and mastoid air cells are well aerated. No abnormal sellar expansion. No is abnormal cerebellar tonsillar descent below the foramen magnum. No suspicious bone marrow signal. IMPRESSION: No acute intracranial process on this motion degraded examination.  Involutional changes and mild chronic small vessel ischemic disease. Old RIGHT pontine infarct. Old RIGHT basal ganglia lacunar infarct. Electronically Signed   By: Elon Alas M.D.   On: 09/06/2015 02:55   US Renal  09/06/2015  CLINICAL DATA:  Acute renal failure. EXAM: RENAL / URINARY TRACT ULTRASOUND COMPLETE COMPARISON:  None. FINDINGS: Right Kidney: Length: 9.6 cm. Heterogeneous increased parenchymal echotexture with parenchymal thinning suggesting chronic medical renal disease. No hydronephrosis or solid mass. Left Kidney: Length: 9.8 cm. Heterogeneous increased parenchymal echotexture likely indicating chronic medical renal disease. Appears to be circumscribed hypoechoic appearance of the lower pole of the left kidney. This may indicate edema versus abscess. No hydronephrosis or solid mass identified. Bladder: No bladder wall thickening or filling defect. Bilateral urine flow jets are demonstrated on color flow Doppler imaging. Incidental note of mildly enlarged prostate gland measuring 3.6 x 3.6 x 3.3 cm. IMPRESSION: Heterogeneous parenchymal echotexture bilaterally suggesting chronic medical renal disease. Hypoechoic appearance of the lower pole left kidney could indicate edema, pyelonephritis, or abscess. No hydronephrosis in either kidney. Mild prostate enlargement. Electronically Signed   By: Lucienne Capers M.D.   On: 09/06/2015 03:19   Portable Chest 1 View  09/06/2015  CLINICAL DATA:  Fever. EXAM: PORTABLE CHEST 1 VIEW COMPARISON:  09/05/2015.  07/20/2004. FINDINGS: Mediastinum and hilar structures are normal. Heart size stable. Persistent right lower lobe subsegmental atelectasis and/or scarring. No pleural effusion pneumothorax. IMPRESSION: Stable right base subsegmental atelectasis and/or scarring. No acute cardiopulmonary disease. Electronically Signed   By: Marcello Moores  Register   On: 09/06/2015 07:19      Lab Results  Component Value Date   HGBA1C 13.2* 12/22/2011   Lab Results   Component Value Date   CREATININE 3.14* 09/06/2015       Scheduled Meds: . aspirin  81 mg Oral Daily  . fluconazole (DIFLUCAN) IV  100 mg Intravenous Q24H  . guaiFENesin  1,200 mg Oral BID  . heparin  5,000 Units Subcutaneous 3 times per day  . insulin aspart  0-9 Units Subcutaneous TID WC  . pantoprazole (PROTONIX) IV  40 mg Intravenous Q24H  . piperacillin-tazobactam (ZOSYN)  IV  3.375 g Intravenous 3 times per day  . sodium chloride  3 mL Intravenous Q12H  . sodium polystyrene  45 g Rectal Once  . [START ON 09/07/2015]  vancomycin  1,000 mg Intravenous Q48H   Continuous Infusions: . sodium chloride 75 mL (09/05/15 2000)    Principal Problem:   SIRS (systemic inflammatory response syndrome) (HCC) Active Problems:   Diabetes mellitus (HCC)   Weakness   Hypotension   Hyperkalemia   ARF (acute renal failure) (HCC)   Dehydration   Left sided numbness   H/O: CVA (cerebrovascular accident)   CAD (coronary artery disease)   Oral thrush   Dysphagia   Anemia   Hyponatremia   Weakness of left side of body    Time spent: 45 minutes   [facility] Pager [phone]. If 7PM-7AM, please contact night-coverage at [email or web address], password [facility] 09/06/2015, 1:39 PM  LOS: 1 day

## 2015-09-06 NOTE — Progress Notes (Signed)
Rehab Admissions Coordinator Note:  Patient was screened by Cleatrice Burke for appropriateness for an Inpatient Acute Rehab Consult PT and OT recommendations.   At this time, we are recommending Inpatient Rehab consult once further diagnostic workup is complete. Noted MRI lumbar spine ordered.  Cleatrice Burke 09/06/2015, 2:31 PM  I can be reached at [phone].

## 2015-09-06 NOTE — Progress Notes (Signed)
Attempted to insert foley cath. Noted with resistance, complaining of pain when advancing the catheter. MD aware. Bladder scanning done- 160 ml. MD aware with order.

## 2015-09-06 NOTE — Evaluation (Signed)
Clinical/Bedside Swallow Evaluation Patient Details  Name: Kyian Obst MRN: [id number] Date of Birth: 06-Apr-1951  Today's Date: 09/06/2015 Time: SLP Start Time (ACUTE ONLY): 4098 SLP Stop Time (ACUTE ONLY): 1242 SLP Time Calculation (min) (ACUTE ONLY): 15 min  Past Medical History:  Past Medical History  Diagnosis Date  . Diabetes mellitus   . Hypertension   . Coronary artery disease   . Myocardial infarction ([place])   . Stroke [facility]) 2004  . CAD (coronary artery disease) 09/05/2015   Past Surgical History:  Past Surgical History  Procedure Laterality Date  . Basilar arterty stent     HPI:  64 y.o. male admitted with SIRS, left-sided weakness/numbness, acute renal failure, hyperkalemia. MRI showed no acute process; remote right BG and pontine CVA.  Despite passing RN swallow screen, MD still wants SLP swallow eval per RN.   Assessment / Plan / Recommendation Clinical Impression  Pt presents with normal oropharyngeal function with adequate mastication, brisk swallow response, and no s/s of aspiration.  Recommend resumption of regular diet.  No SLP f/u.     Aspiration Risk  Mild    Diet Recommendation Thin;Age appropriate regular solids   Medication Administration: Whole meds with liquid    Other  Recommendations Oral Care Recommendations: Oral care BID       Swallow Study Prior Functional Status  Type of Home: House Available Help at Discharge: Family;Available PRN/intermittently    General Date of Onset: 09/05/15 Other Pertinent Information: 64 y.o. male admitted with SIRS, left-sided weakness/numbness, acute renal failure, hyperkalemia. MRI showed no acute process; remote right BG and pontine CVA.  Despite passing RN swallow screen, MD still wants SLP swallow eval per RN. Type of Study: Bedside swallow evaluation Previous Swallow Assessment: no Diet Prior to this Study: NPO Temperature Spikes Noted: Yes Respiratory Status: Supplemental O2 delivered via  (comment) History of Recent Intubation: No Behavior/Cognition: Alert;Cooperative Oral Cavity - Dentition: Adequate natural dentition/normal for age;Missing dentition Self-Feeding Abilities: Able to feed self Patient Positioning: Upright in chair/Tumbleform Baseline Vocal Quality: Normal Volitional Cough: Strong Volitional Swallow: Able to elicit    Oral/Motor/Sensory Function Overall Oral Motor/Sensory Function: Appears within functional limits for tasks assessed (mild left asymmetry lower face)   Ice Chips Ice chips: Within functional limits   Thin Liquid Thin Liquid: Within functional limits Presentation: Cup;Straw    Nectar Thick Nectar Thick Liquid: Not tested   Honey Thick Honey Thick Liquid: Not tested   Puree Puree: Within functional limits Presentation: [place]. [place], [place] CCC/SLP Pager [phone]     Solid: Within functional limits Presentation: Self Fed       Juan Quam Laurice 09/06/2015,12:52 PM

## 2015-09-06 NOTE — Progress Notes (Signed)
Utilization review completed. Khamya Topp, RN, BSN. 

## 2015-09-06 NOTE — Progress Notes (Addendum)
Daughter Tammy made aware about the transfer to [place] room 8. Report given to RN.

## 2015-09-06 NOTE — Progress Notes (Signed)
SATURATION QUALIFICATIONS: (This note is used to comply with regulatory documentation for home oxygen)  Patient Saturations on Room Air at Rest = 88%  Patient Saturations on 2L O2 while Ambulating = 90%  Please briefly explain why patient needs home oxygen: Patient currently unable to maintain sats at >90% during rest on Room Air.  Roanna Epley, SPT [phone] I have read, reviewed and agree with student's note.   [place] [phone] (pager)

## 2015-09-06 NOTE — Progress Notes (Signed)
Initial Nutrition Assessment  DOCUMENTATION CODES:   Not applicable  INTERVENTION:   -RD will follow for diet advancement -Add Glucerna Shake po TID, each supplement provides 220 kcal and 10 grams of protein once diet is advanced  NUTRITION DIAGNOSIS:   Inadequate oral intake related to inability to eat as evidenced by NPO status.  GOAL:   Patient will meet greater than or equal to 90% of their needs  MONITOR:   PO intake, Supplement acceptance, Diet advancement, Labs, Weight trends, Skin, I & O's  REASON FOR ASSESSMENT:   Consult, Malnutrition Screening Tool Assessment of nutrition requirement/status  ASSESSMENT:   Patient is a 64 y o with history of CVA with residual left sided weakness, DM, HTN, CAD who presented to ED with sudden onset of left lower extremity numbness and left-sided weakness on the day of admission.   Pt admitted for SIRS.   Hx obtained by pt at bedside. He reports a decreased appetite for the past 1-2 months, reporting "nothing tastes good". He denies any difficulty chewing or swallowing foods and liquids. He reveals that he consumes 1-2 meals per day PTA, however, often only eats a small portion of what is served ("maybe a few bites"). He reports he ate only 2 bites of a bacon sandwich yesterday.   Pt confirms UBW of 170# and denies any weight loss, however, reports that he started wearing suspenders due to his pants becoming too loose. Nutrition-Focused physical exam completed. Findings are mild fat depletion, mild muscle depletion, and no edema. Noted abdominal distention on exam, which may be masking true weight loss.   Pt reports struggling with hypoglycemia as of late. He reveals he checks his blood sugars 4 times daily and typically yields readings of 40-80. He expresses frustration, as he has been through diabetes education classes twice and his glyburide dosage was recently reduced, but he still remains hypoglycemic. He also reveals that his wife  is not always compliant with DM diet guidelines ("I'm supposed to eat 5 times a day and she doesn't understand that"). Spent most of this visit discussing management of hypoglycemia; discussed ways to add between meal snacks (such as yogurt, peanut butter crackers) to increase nutritional intake and prevent hypoglycemia. Pt was able to identify how to correct hypoglycemia when a low reading occurred. He recently started taking Glucerna supplements PTA and is amenable to have them once his diet is advanced.   Per RN notes, pt failed swallow screen in ER, but passed after second attempt last night. Awaiting SLP evaluation.   Labs reviewed. No recent Hgb A1c reading.   Diet Order:  Diet NPO time specified Except for: Ice Chips  Skin:  Reviewed, no issues  Last BM:  09/03/15  Height:   Ht Readings from Last 1 Encounters:  09/05/15 5\' 5"  (1.651 m)    Weight:   Wt Readings from Last 1 Encounters:  09/06/15 169 lb 12.1 oz (77 kg)    Ideal Body Weight:  61.8 kg  BMI:  Body mass index is 28.25 kg/(m^2).  Estimated Nutritional Needs:   Kcal:  6195-0932  Protein:  80-95 grams  Fluid:  1.7-1.9 L  EDUCATION NEEDS:   Education needs addressed  Gedalia Mcmillon A. Jimmye Norman, RD, LDN, CDE Pager: [phone] After hours Pager: [phone]

## 2015-09-06 NOTE — Evaluation (Signed)
Physical Therapy Evaluation Patient Details Name: Carlos Black MRN: [id number] DOB: Nov 04, 1951 Today's Date: 09/06/2015   History of Present Illness  64 y o with history of CVA with residual left sided weakness, DM, HTN, CAD who presented to ED with sudden onset of left lower extremity numbness and left-sided weakness on the day of admission. Patient stated that he was extremely weak and unable to get off the commode and had to have 3 people help him up.  Clinical Impression  Patient in bed, agreeable to participate in PT today. Patient was able to transfer as described below. Patient with 0/5 strength in L LE currently, which he reports is not his baseline. Patient with needs for intensive therapy to regain function and strength to allow him to return to modified independent ambulatory function. Patient will benefit from continued PT while in the hospital to improve function and strength of L extremities and increase independence with transfers, with an ultimate goal to progress towards ambulation.     Follow Up Recommendations CIR;Supervision/Assistance - 24 hour    Equipment Recommendations  Other (comment) (TBA)    Recommendations for Other Services OT consult;Speech consult;Rehab consult     Precautions / Restrictions Precautions Precautions: Fall Restrictions Weight Bearing Restrictions: No      Mobility  Bed Mobility Overal bed mobility: Needs Assistance Bed Mobility: Supine to Sit     Supine to sit: Min assist;HOB elevated        Transfers Overall transfer level: Needs assistance Equipment used: 2 person hand held assist Transfers: Sit to/from [facility] Sit to Stand: Mod assist;+2 physical assistance Stand pivot transfers: Max assist;+2 physical assistance          Ambulation/Gait             General Gait Details: Deferred due to patient weakness.  Stairs            Wheelchair Mobility    Modified Rankin (Stroke  Patients Only)       Balance                                             Pertinent Vitals/Pain Pain Assessment: No/denies pain  Patient Saturations on Room Air at Rest = 88%  Patient Saturations on 2L O2 while Ambulating = 90%    Home Living Family/patient expects to be discharged to:: Private residence Living Arrangements: Spouse/significant other;Children;Other relatives (Son and grandson.) Available Help at Discharge: Family;Available PRN/intermittently Type of Home: House Home Access: Level entry     Home Layout: One level Home Equipment: Cane - single point;Tub bench Additional Comments: Wife not able to provide assistance necessary at this time, son and grandson busy during the day and home around 5pm.    Prior Function Level of Independence: Independent with assistive device(s)         Comments: [place] Pulling drives him and wife to go grocery shopping. Otherwise was independent with cane prior to past weekend.     Hand Dominance   Dominant Hand: Right    Extremity/Trunk Assessment   Upper Extremity Assessment: Defer to OT evaluation           Lower Extremity Assessment: LLE deficits/detail;RLE deficits/detail RLE Deficits / Details: Knee extension 3/5, hip flexion 2+/5. LLE Deficits / Details: Knee extension/hip flexion 1/5 (trace muscle contraction noted).      Communication   Communication: HOH (At  times unsure of attention span.)  Cognition Arousal/Alertness: Awake/alert Behavior During Therapy: Flat affect Overall Cognitive Status: No family/caregiver present to determine baseline cognitive functioning                      General Comments      Exercises        Assessment/Plan    PT Assessment Patient needs continued PT services  PT Diagnosis Hemiplegia non-dominant side;Generalized weakness;Difficulty walking;Abnormality of gait   PT Problem List Decreased strength;Decreased range of motion;Decreased activity  tolerance;Decreased balance;Decreased mobility;Decreased coordination  PT Treatment Interventions DME instruction;Gait training;Stair training;Functional mobility training;Therapeutic activities;Therapeutic exercise;Balance training;Neuromuscular re-education;Patient/family education   PT Goals (Current goals can be found in the Care Plan section) Acute Rehab PT Goals Patient Stated Goal: Get better and become more independent. PT Goal Formulation: With patient Time For Goal Achievement: 09/20/15 Potential to Achieve Goals: Good    Frequency Min 4X/week   Barriers to discharge Decreased caregiver support Requires 24/7 assistance currently which he does not have available to him.    Co-evaluation PT/OT/SLP Co-Evaluation/Treatment: Yes Reason for Co-Treatment: Complexity of the patient's impairments (multi-system involvement);For patient/therapist safety PT goals addressed during session: Mobility/safety with mobility;Balance;Strengthening/ROM         End of Session Equipment Utilized During Treatment: Gait belt Activity Tolerance: Patient limited by fatigue Patient left: in chair;with call bell/phone within reach Nurse Communication: Mobility status         Time: 1109-1140 PT Time Calculation (min) (ACUTE ONLY): 31 min   Charges:   PT Evaluation $Initial PT Evaluation Tier I: 1 Procedure     PT G CodesRoanna Epley, SPT [phone] 09/06/2015, 12:55 PM  I have read, reviewed and agree with student's note.   [place] [phone] (pager)

## 2015-09-06 NOTE — Evaluation (Signed)
Occupational Therapy Evaluation Patient Details Name: Carlos Black MRN: [id number] DOB: Sep 02, 1951 Today's Date: 09/06/2015    History of Present Illness 26 y o with history of CVA with residual left sided weakness, DM, HTN, CAD who presented to ED with sudden onset of left lower extremity numbness and left-sided weakness on the day of admission. Patient stated that he was extremely weak and unable to get off the commode and had to have 3 people help him up.   Clinical Impression   This 64 yo male admitted with above presents to acute OT with decreased use of left side (weaker than he was), decreased mobility/balance, decreased safety awareness all affecting what he reports to be his PLOF of Mod I with all BADLs. He will benefit from acute OT with follow up OT on CIR to get to a S level or better.    Follow Up Recommendations  CIR    Equipment Recommendations   (TBD at next venue)       Precautions / Restrictions Precautions Precautions: Fall Restrictions Weight Bearing Restrictions: No      Mobility Bed Mobility Overal bed mobility: Needs Assistance Bed Mobility: Supine to Sit     Supine to sit: Min assist;HOB elevated        Transfers Overall transfer level: Needs assistance Equipment used: 2 person hand held assist Transfers: Sit to/from [facility] Sit to Stand: Mod assist;+2 physical assistance Stand pivot transfers: Max assist;+2 physical assistance            Balance Overall balance assessment: Needs assistance Sitting-balance support: Single extremity supported;Feet supported Sitting balance-Leahy Scale: Poor Sitting balance - Comments: On one occasion he had gotten his balance on the EOB and I stepped away at which point he went to the right and backwards on the bed (he did not attempt to catch himself nor did he try and sit back up on his own). I asked him what happened and he said he was dizzy and tired (BP was checked earlier in  session when he said he as dizzy and no significant change noted) Postural control: Posterior lean;Right lateral lean Standing balance support: Bilateral upper extremity supported Standing balance-Leahy Scale: Zero                              ADL Overall ADL's : Needs assistance/impaired Eating/Feeding: Set up;Sitting   Grooming: Minimal assistance (supported sitting)   Upper Body Bathing: Minimal assitance (supported sitting)   Lower Body Bathing: Maximal assistance (with +2 Mod A sit<>stand)   Upper Body Dressing : Maximal assistance (supported sitting)   Lower Body Dressing: Total assistance (with +2 Mod A sit<>stand)   Toilet Transfer: Maximal assistance;+2 for physical assistance;Stand-pivot (bed to recliner going to pt's left)   Toileting- Clothing Manipulation and Hygiene: Total assistance (with +2 Mod A sit<>stand)               Vision Additional Comments: No change from baseline and pt does not wear glasses          Pertinent Vitals/Pain Pain Assessment: No/denies pain     Hand Dominance Right   Extremity/Trunk Assessment Upper Extremity Assessment Upper Extremity Assessment: LUE deficits/detail LUE Deficits / Details: old CVA only gross A LUE Coordination: decreased fine motor;decreased gross motor   Lower Extremity Assessment Lower Extremity Assessment: LLE deficits/detail;RLE deficits/detail RLE Deficits / Details: Knee extension 3/5, hip flexion 2+/5. LLE Deficits / Details: Knee extension/hip flexion 1/5 (  trace muscle contraction noted).        Communication Communication Communication: HOH   Cognition Arousal/Alertness: Awake/alert Behavior During Therapy: Flat affect Overall Cognitive Status: No family/caregiver present to determine baseline cognitive functioning                                Home Living Family/patient expects to be discharged to:: Inpatient rehab Living Arrangements: Spouse/significant  other;Children;Other relatives (son and grandson) Available Help at Discharge: Family;Available PRN/intermittently Type of Home: House Home Access: Level entry     Home Layout: One level     Bathroom Shower/Tub: Teacher, early years/pre: Standard Bathroom Accessibility: Yes   Home Equipment: Cane - single point;Tub bench   Additional Comments: Wife not able to provide assistance necessary at this time, son and grandson busy during the day and home around 5pm.      Prior Functioning/Environment Level of Independence: Needs assistance    ADL's / Homemaking Assistance Needed: He was able to bath and dress himself up until this past Monday   Comments: Neighbor drives him and wife to go grocery shopping. Otherwise was independent with cane prior to past weekend.    OT Diagnosis: Generalized weakness;Hemiplegia non-dominant side   OT Problem List: Decreased strength;Decreased range of motion;Impaired UE functional use;Decreased activity tolerance;Impaired balance (sitting and/or standing)   OT Treatment/Interventions: Self-care/ADL training;Patient/family education;Balance training;Therapeutic activities;DME and/or AE instruction    OT Goals(Current goals can be found in the care plan section) Acute Rehab OT Goals Patient Stated Goal: Get better and become more independent. OT Goal Formulation: With patient Time For Goal Achievement: 09/13/15 Potential to Achieve Goals: Good  OT Frequency: Min 3X/week           Co-evaluation PT/OT/SLP Co-Evaluation/Treatment: Yes Reason for Co-Treatment: Complexity of the patient's impairments (multi-system involvement);For patient/therapist safety PT goals addressed during session: Mobility/safety with mobility;Balance;Strengthening/ROM OT goals addressed during session: ADL's and self-care;Strengthening/ROM      End of Session Equipment Utilized During Treatment: Gait belt  Activity Tolerance: Patient limited by  fatigue Patient left: in chair;with call bell/phone within reach   Time: 5300-5110 OT Time Calculation (min): 21 min Charges:  OT General Charges $OT Visit: 1 Procedure OT Evaluation $Initial OT Evaluation Tier I: 1 Procedure  [name] [phone] 09/06/2015, 1:18 PM

## 2015-09-07 ENCOUNTER — Inpatient Hospital Stay (HOSPITAL_COMMUNITY): Payer: Medicare HMO

## 2015-09-07 ENCOUNTER — Encounter (HOSPITAL_COMMUNITY): Payer: Self-pay | Admitting: Radiology

## 2015-09-07 DIAGNOSIS — R200 Anesthesia of skin: Secondary | ICD-10-CM

## 2015-09-07 DIAGNOSIS — R1909 Other intra-abdominal and pelvic swelling, mass and lump: Secondary | ICD-10-CM

## 2015-09-07 DIAGNOSIS — I82409 Acute embolism and thrombosis of unspecified deep veins of unspecified lower extremity: Secondary | ICD-10-CM

## 2015-09-07 DIAGNOSIS — R1900 Intra-abdominal and pelvic swelling, mass and lump, unspecified site: Secondary | ICD-10-CM | POA: Insufficient documentation

## 2015-09-07 DIAGNOSIS — M6281 Muscle weakness (generalized): Secondary | ICD-10-CM

## 2015-09-07 DIAGNOSIS — R000 Tachycardia, unspecified: Secondary | ICD-10-CM

## 2015-09-07 DIAGNOSIS — N179 Acute kidney failure, unspecified: Secondary | ICD-10-CM

## 2015-09-07 LAB — LACTIC ACID, PLASMA: Lactic Acid, Venous: 1.8 mmol/L (ref 0.5–2.0)

## 2015-09-07 LAB — CBC WITH DIFFERENTIAL/PLATELET
Basophils Absolute: 0 10*3/uL (ref 0.0–0.1)
Basophils Relative: 0 %
Eosinophils Absolute: 0.2 10*3/uL (ref 0.0–0.7)
Eosinophils Relative: 4 %
HEMATOCRIT: 28.6 % — AB (ref 39.0–52.0)
HEMOGLOBIN: 9.3 g/dL — AB (ref 13.0–17.0)
LYMPHS ABS: 0.9 10*3/uL (ref 0.7–4.0)
Lymphocytes Relative: 21 %
MCH: 25.1 pg — AB (ref 26.0–34.0)
MCHC: 32.5 g/dL (ref 30.0–36.0)
MCV: 77.3 fL — ABNORMAL LOW (ref 78.0–100.0)
MONOS PCT: 18 %
Monocytes Absolute: 0.8 10*3/uL (ref 0.1–1.0)
NEUTROS ABS: 2.4 10*3/uL (ref 1.7–7.7)
NEUTROS PCT: 57 %
Platelets: 180 10*3/uL (ref 150–400)
RBC: 3.7 MIL/uL — ABNORMAL LOW (ref 4.22–5.81)
RDW: 16.5 % — ABNORMAL HIGH (ref 11.5–15.5)
WBC: 4.3 10*3/uL (ref 4.0–10.5)

## 2015-09-07 LAB — GLUCOSE, CAPILLARY
GLUCOSE-CAPILLARY: 113 mg/dL — AB (ref 65–99)
Glucose-Capillary: 72 mg/dL (ref 65–99)
Glucose-Capillary: 176 mg/dL — ABNORMAL HIGH (ref 65–99)
Glucose-Capillary: 276 mg/dL — ABNORMAL HIGH (ref 65–99)

## 2015-09-07 LAB — COMPREHENSIVE METABOLIC PANEL
ALBUMIN: 2.4 g/dL — AB (ref 3.5–5.0)
ALT: 20 U/L (ref 17–63)
AST: 41 U/L (ref 15–41)
Alkaline Phosphatase: 64 U/L (ref 38–126)
Anion gap: 9 (ref 5–15)
BUN: 24 mg/dL — AB (ref 6–20)
CHLORIDE: 105 mmol/L (ref 101–111)
CO2: 21 mmol/L — ABNORMAL LOW (ref 22–32)
Calcium: 8.2 mg/dL — ABNORMAL LOW (ref 8.9–10.3)
Creatinine, Ser: 3.01 mg/dL — ABNORMAL HIGH (ref 0.61–1.24)
GFR calc Af Amer: 24 mL/min — ABNORMAL LOW (ref >60)
GFR calc non Af Amer: 20 mL/min — ABNORMAL LOW (ref >60)
GLUCOSE: 103 mg/dL — AB (ref 65–99)
POTASSIUM: 5.2 mmol/L — AB (ref 3.5–5.1)
Sodium: 135 mmol/L (ref 135–145)
Total Bilirubin: 1 mg/dL (ref 0.3–1.2)
Total Protein: 6.6 g/dL (ref 6.5–8.1)

## 2015-09-07 LAB — HEMOGLOBIN A1C
Hgb A1c MFr Bld: 6.2 % — ABNORMAL HIGH (ref 4.8–5.6)
MEAN PLASMA GLUCOSE: 131 mg/dL

## 2015-09-07 LAB — LACTATE DEHYDROGENASE: LDH: 376 U/L — ABNORMAL HIGH (ref 98–192)

## 2015-09-07 LAB — URINE CULTURE: Culture: 3000

## 2015-09-07 LAB — FERRITIN: FERRITIN: 766 ng/mL — AB (ref 24–336)

## 2015-09-07 LAB — URIC ACID: Uric Acid, Serum: 6.7 mg/dL (ref 4.4–7.6)

## 2015-09-07 MED ORDER — SODIUM POLYSTYRENE SULFONATE 15 GM/60ML PO SUSP
30.0000 g | Freq: Once | ORAL | Status: AC
  Administered 2015-09-07: 30 g via ORAL
  Filled 2015-09-07: qty 120

## 2015-09-07 MED ORDER — FUROSEMIDE 10 MG/ML IJ SOLN
40.0000 mg | Freq: Once | INTRAMUSCULAR | Status: AC
  Administered 2015-09-07: 40 mg via INTRAVENOUS
  Filled 2015-09-07: qty 4

## 2015-09-07 MED ORDER — VANCOMYCIN HCL IN DEXTROSE 750-5 MG/150ML-% IV SOLN
750.0000 mg | INTRAVENOUS | Status: DC
Start: 2015-09-07 — End: 2015-09-09
  Administered 2015-09-07 – 2015-09-08 (×2): 750 mg via INTRAVENOUS
  Filled 2015-09-07 (×3): qty 150

## 2015-09-07 MED ORDER — IOHEXOL 300 MG/ML  SOLN
25.0000 mL | INTRAMUSCULAR | Status: AC
  Administered 2015-09-07 (×2): 25 mL via ORAL

## 2015-09-07 MED ORDER — DEXAMETHASONE SODIUM PHOSPHATE 10 MG/ML IJ SOLN
10.0000 mg | Freq: Once | INTRAMUSCULAR | Status: AC
  Administered 2015-09-07: 10 mg via INTRAVENOUS
  Filled 2015-09-07: qty 1

## 2015-09-07 MED ORDER — GLUCERNA SHAKE PO LIQD
237.0000 mL | Freq: Three times a day (TID) | ORAL | Status: DC
  Administered 2015-09-07 – 2015-09-13 (×10): 237 mL via ORAL
  Filled 2015-09-07 (×7): qty 237

## 2015-09-07 MED ORDER — HEPARIN SODIUM (PORCINE) 5000 UNIT/ML IJ SOLN
5000.0000 [IU] | Freq: Three times a day (TID) | INTRAMUSCULAR | Status: DC
  Administered 2015-09-07 – 2015-09-12 (×15): 5000 [IU] via SUBCUTANEOUS
  Filled 2015-09-07 (×15): qty 1

## 2015-09-07 NOTE — H&P (Signed)
Chief Complaint: Patient was seen in consultation today for large left retroperitoneal mass Chief Complaint  Patient presents with  . Extremity Weakness   at the request of [facility] Dr. Allyson Sabal  Referring Physician(s): [facility] Dr. Allyson Sabal   History of Present Illness: Carlos Black is a 64 y.o. male presented with left sided numbness and weakness worsening since 09/04/15. He states he has history of previous stroke with previous left sided weakness but never this bad. He was using the restroom and suddenly his left side was weak and he was unable to stand. CT head and MR brain without acute findings. Imaging revealed large left retroperitoneal mass and IR received request for image guided biopsy. He denies any chest pain, shortness of breath or palpitations. He denies any active signs of bleeding or excessive bruising. The patient denies any history of sleep apnea or chronic oxygen use. He is currently on 3L in the hospital. He has no known complications to sedation.    Past Medical History  Diagnosis Date  . Hypertension   . Coronary artery disease   . Myocardial infarction ([place])   . CAD (coronary artery disease) 09/05/2015  . SIRS (systemic inflammatory response syndrome) ([place]) 09/06/2015  . Acute renal failure (ARF) ([place]) 09/06/2015  . Type II diabetes mellitus ([place])   . Stroke [facility]) 2004    w/left sided weakness/notes 09/05/2015    Past Surgical History  Procedure Laterality Date  . Basilar arterty stent  07/25/2004     Dr. Abel Presto Deveshwar/notes 02/01/2011    Allergies: Review of patient's allergies indicates no known allergies.  Medications: Prior to Admission medications   Medication Sig Start Date End Date Taking? Authorizing Provider  amLODipine (NORVASC) 10 MG tablet Take 10 mg by mouth daily.   Yes Historical Provider, MD  atorvastatin (LIPITOR) 10 MG tablet Take 10 mg by mouth daily.   Yes Historical Provider, MD  losartan (COZAAR) 100 MG tablet Take 100 mg by mouth  daily. Patient states he is taking the losartan and Norvasc per patient   Yes Historical Provider, MD  metFORMIN (GLUCOPHAGE) 1000 MG tablet Take 1,000 mg by mouth 2 (two) times daily with a meal.   Yes Historical Provider, MD  amLODipine (NORVASC) 10 MG tablet Take 1 tablet (10 mg total) by mouth daily. 12/25/11 12/24/12  Lavone Orn, MD  atorvastatin (LIPITOR) 10 MG tablet Take 1 tablet (10 mg total) by mouth daily. 12/25/11 12/24/12  Lavone Orn, MD  glimepiride (AMARYL) 4 MG tablet Take 1 tablet (4 mg total) by mouth daily with breakfast. 12/25/11 12/24/12  Lavone Orn, MD  metFORMIN (GLUCOPHAGE) 1000 MG tablet Take 1 tablet (1,000 mg total) by mouth 2 (two) times daily with a meal. 12/25/11 12/24/12  Lavone Orn, MD     Family History  Problem Relation Age of Onset  . Diabetes Mellitus II Mother   . Pneumonia Father   . Diabetes Father     Social History   Social History  . Marital Status: Married    Spouse Name: N/A  . Number of Children: N/A  . Years of Education: N/A   Social History Main Topics  . Smoking status: Never Smoker   . Smokeless tobacco: None  . Alcohol Use: No  . Drug Use: No  . Sexual Activity: Yes   Other Topics Concern  . None   Social History Narrative   Review of Systems: A 12 point ROS discussed and pertinent positives are indicated in the HPI above.  All other systems are negative.  Review of Systems  Vital Signs: BP 143/74 mmHg  Pulse 98  Temp(Src) 98.3 F (36.8 C) (Oral)  Resp 36  Ht 5\' 5"  (1.651 m)  Wt 170 lb 13.7 oz (77.5 kg)  BMI 28.43 kg/m2  SpO2 94% 2L  Physical Exam  Constitutional: He is oriented to person, place, and time. No distress.  HENT:  Head: Normocephalic and atraumatic.  Cardiovascular: Normal rate and regular rhythm.  Exam reveals no gallop and no friction rub.   No murmur heard. Pulmonary/Chest: Effort normal and breath sounds normal. No respiratory distress. He has no wheezes. He has no rales.  Abdominal: Soft.  Bowel sounds are normal. He exhibits no distension. There is no tenderness.  Neurological: He is alert and oriented to person, place, and time.  Unable to lift left leg or left arm, left leg sensation intact   Skin: He is not diaphoretic.    Mallampati Score:  MD Evaluation Airway: WNL Heart: WNL Abdomen: WNL Chest/ Lungs: WNL ASA  Classification: 3 Mallampati/Airway Score: Two  Imaging: Dg Chest 2 View  09/05/2015  CLINICAL DATA:  64 year old with acute onset of left lower extremity paresis/numbness which began earlier today. Fever. Current history of hypertension and diabetes. Prior stroke and MI. EXAM: CHEST  2 VIEW COMPARISON:  07/26/2004, 07/20/2004. FINDINGS: AP semi-erect and lateral images were obtained. Chronic elevation of the right hemidiaphragm with chronic scar/atelectasis involving the right lower lobe and right middle lobe, unchanged. Lungs otherwise clear. No localized airspace consolidation. No pleural effusions. No pneumothorax. Normal pulmonary vascularity. Cardiac silhouette upper normal in size for AP technique. Thoracic aorta mildly atherosclerotic and tortuous, unchanged. Hilar and mediastinal contours otherwise unremarkable. Visualized bony thorax intact with only mild degenerative changes in the mid thoracic region. IMPRESSION: 1.  No acute cardiopulmonary disease. 2. Stable chronic elevation of the right hemidiaphragm and chronic scar/atelectasis involving the right lower lobe and right middle lobe. Electronically Signed   By: Evangeline Dakin M.D.   On: 09/05/2015 15:26   Ct Head Wo Contrast  09/05/2015  CLINICAL DATA:  Left-sided weakness from previous CVA, weakness has increased over the last day with difficulty walking EXAM: CT HEAD WITHOUT CONTRAST TECHNIQUE: Contiguous axial images were obtained from the base of the skull through the vertex without intravenous contrast. COMPARISON:  12/21/2011 FINDINGS: The bony calvarium is intact. Atrophic changes are noted.  No findings to suggest acute hemorrhage, acute infarction or space-occupying mass lesion are noted. IMPRESSION: Atrophic changes without acute abnormality. Electronically Signed   By: Inez Catalina M.D.   On: 09/05/2015 16:37   Mr Brain Wo Contrast  09/06/2015  CLINICAL DATA:  LEFT lower extremity numbness and LEFT-sided weakness for 2 days. Night cough for 2-3 weeks. History of stroke and LEFT-sided weakness, diabetes, hypertension, coronary artery disease. EXAM: MRI HEAD WITHOUT CONTRAST TECHNIQUE: Multiplanar, multiecho pulse sequences of the brain and surrounding structures were obtained without intravenous contrast. COMPARISON:  CT head September 05, 2015 at 1626 hours FINDINGS: Multiple sequences are mild or moderately motion degraded. No reduced diffusion to suggest acute ischemia. No susceptibility artifact to suggest hemorrhage. Ventricles and sulci are normal for patient's age. Old RIGHT ventral pons infarct. Old RIGHT basal ganglia lacunar infarct. Mild RIGHT cerebral peduncle volume loss compatible with wallerian degeneration. Mild white matter changes compatible with chronic small vessel ischemic disease, decreased sensitivity due to patient motion. No abnormal extra-axial fluid collections. Dolicoectatic intracranial vessel flow voids seen at the skull base. Ocular globes and  orbital contents are grossly normal though motion degraded. Paranasal sinuses and mastoid air cells are well aerated. No abnormal sellar expansion. No is abnormal cerebellar tonsillar descent below the foramen magnum. No suspicious bone marrow signal. IMPRESSION: No acute intracranial process on this motion degraded examination. Involutional changes and mild chronic small vessel ischemic disease. Old RIGHT pontine infarct. Old RIGHT basal ganglia lacunar infarct. Electronically Signed   By: Elon Alas M.D.   On: 09/06/2015 02:55   Mr Lumbar Spine Wo Contrast  09/06/2015  CLINICAL DATA:  LEFT lower extremity numbness  and LEFT-sided weakness for 2 days. History of stroke in LEFT-sided weakness, diabetes, hypertension, coronary artery disease. EXAM: MRI LUMBAR SPINE WITHOUT CONTRAST TECHNIQUE: Multiplanar, multisequence MR imaging of the lumbar spine was performed. No intravenous contrast was administered. COMPARISON:  None. FINDINGS: Lumbar vertebral bodies and posterior elements are intact and aligned with maintenance of lumbar lordosis. Intervertebral discs demonstrate normal morphology, with slight decreased T2 signal within the lower lumbar disc consistent with mild desiccation. Minimal subacute discogenic endplate changes at all lumbar levels, no STIR signal abnormality to suggest acute osseous process. Conus medullaris terminates at L1 and appears normal in morphology and signal characteristics. Limited assessment of cauda equina due to patient motion. LEFT abdominal mass partially imaged, with mass effect on the LEFT psoas muscle. Probable lymphadenopathy along LEFT Common iliac chain. Probable obstructive uropathy on the LEFT. Partially imaged sub cm gallstone. Level by level evaluation: L1-2 and L2-3: No significant disc bulge, canal stenosis or neural foraminal narrowing. L3-4: Small broad-based disc bulge, mild facet and ligamentum flavum redundancy without canal stenosis. Minimal bilateral neural foraminal narrowing. L4-5: Small broad-based disc bulge asymmetric to LEFT. Mild facet arthropathy and ligamentum flavum redundancy without canal stenosis. Minimal LEFT neural foraminal narrowing. L5-S1: No disc bulge, canal stenosis nor neural foraminal narrowing. IMPRESSION: No acute lumbar spine fracture nor malalignment. No canal stenosis. Minimal L3-4 and L4-5 neural foraminal narrowing. Large LEFT retroperitoneal mass with mass effect on the LEFT psoas muscle and probable lymphadenopathy along LEFT iliac chain. Recommend CT of the abdomen and pelvis with contrast. Mass appears to result in LEFT obstructive uropathy.  Electronically Signed   By: Elon Alas M.D.   On: 09/06/2015 23:18   US Renal  09/06/2015  CLINICAL DATA:  Acute renal failure. EXAM: RENAL / URINARY TRACT ULTRASOUND COMPLETE COMPARISON:  None. FINDINGS: Right Kidney: Length: 9.6 cm. Heterogeneous increased parenchymal echotexture with parenchymal thinning suggesting chronic medical renal disease. No hydronephrosis or solid mass. Left Kidney: Length: 9.8 cm. Heterogeneous increased parenchymal echotexture likely indicating chronic medical renal disease. Appears to be circumscribed hypoechoic appearance of the lower pole of the left kidney. This may indicate edema versus abscess. No hydronephrosis or solid mass identified. Bladder: No bladder wall thickening or filling defect. Bilateral urine flow jets are demonstrated on color flow Doppler imaging. Incidental note of mildly enlarged prostate gland measuring 3.6 x 3.6 x 3.3 cm. IMPRESSION: Heterogeneous parenchymal echotexture bilaterally suggesting chronic medical renal disease. Hypoechoic appearance of the lower pole left kidney could indicate edema, pyelonephritis, or abscess. No hydronephrosis in either kidney. Mild prostate enlargement. Electronically Signed   By: Lucienne Capers M.D.   On: 09/06/2015 03:19   Portable Chest 1 View  09/06/2015  CLINICAL DATA:  Fever. EXAM: PORTABLE CHEST 1 VIEW COMPARISON:  09/05/2015.  07/20/2004. FINDINGS: Mediastinum and hilar structures are normal. Heart size stable. Persistent right lower lobe subsegmental atelectasis and/or scarring. No pleural effusion pneumothorax. IMPRESSION: Stable right base subsegmental atelectasis  and/or scarring. No acute cardiopulmonary disease. Electronically Signed   By: Marcello Moores  Register   On: 09/06/2015 07:19    Labs:  CBC:  Recent Labs  09/05/15 1500 09/06/15 0242 09/07/15 0917  WBC 5.9 4.2 4.3  HGB 11.2* 9.8* 9.3*  HCT 34.3* 30.7* 28.6*  PLT 267 205 180    COAGS:  Recent Labs  09/05/15 1500  INR 1.26    APTT 32    BMP:  Recent Labs  09/05/15 1500 09/05/15 2043 09/06/15 0242 09/07/15 0917  NA 129* 132* 135 135  K 5.9* 5.0 5.0 5.2*  CL 93* 98* 100* 105  CO2 25 23 20* 21*  GLUCOSE 150* 117* 116* 103*  BUN 37* 32* 31* 24*  CALCIUM 9.2 8.2* 8.4* 8.2*  CREATININE 3.44* 3.24* 3.14* 3.01*  GFRNONAA 17* 19* 19* 20*  GFRAA 20* 22* 22* 24*    LIVER FUNCTION TESTS:  Recent Labs  09/05/15 1500 09/06/15 0242 09/07/15 0917  BILITOT 1.3* 1.0 1.0  AST 50* 34 41  ALT 21 19 20   ALKPHOS 79 61 64  PROT 7.3 6.3* 6.6  ALBUMIN 3.0* 2.4* 2.4*    Assessment and Plan: Left sided weakness worsening since 09/04/15 Imaging revealed large left retroperitoneal mass- now on decadron, Oncology following  Request for image guided biopsy The patient will be NPO after midnight, sq heparin to be held 10/14, labs and vitals have been reviewed. Risks and Benefits discussed with the patient including, but not limited to bleeding, infection, damage to adjacent structures or low yield requiring additional tests. All of the patient's questions were answered, patient is agreeable to proceed. Consent signed and in chart. History of CVA, recent CT and MR no acute findings   Anemia Renal failure   Thank you for this interesting consult.  I greatly enjoyed meeting Omario Ander and look forward to participating in their care.  A copy of this report was sent to the requesting provider on this date.  SignedHedy Jacob 09/07/2015, 1:31 PM   I spent a total of 20 Minutes in face to face in clinical consultation, greater than 50% of which was counseling/coordinating care for left retroperitoneal mass.

## 2015-09-07 NOTE — Progress Notes (Signed)
ANTIBIOTIC CONSULT NOTE - FOLLOW UP  Pharmacy Consult for Vancomycin + Zosyn Indication: rule out sepsis  No Known Allergies  Patient Measurements: Height: 5\' 5"  (165.1 cm) Weight: 170 lb 13.7 oz (77.5 kg) IBW/kg (Calculated) : 61.5  Vital Signs: Temp: 98.3 F (36.8 C) (10/13 1117) Temp Source: Oral (10/13 1117) BP: 143/74 mmHg (10/13 0800) Pulse Rate: 98 (10/13 0800) Intake/Output from previous day: 10/12 0701 - 10/13 0700 In: 1505 [P.O.:580; I.V.:825; IV Piggyback:100] Out: 450 [Urine:450] Intake/Output from this shift: Total I/O In: -  Out: 1600 [Urine:1600]  Labs:  Recent Labs  09/05/15 1500 09/05/15 2043 09/05/15 2220 09/06/15 0242 09/07/15 0917  WBC 5.9  --   --  4.2 4.3  HGB 11.2*  --   --  9.8* 9.3*  PLT 267  --   --  205 180  LABCREA  --   --  176.86  --   --   CREATININE 3.44* 3.24*  --  3.14* 3.01*   Estimated Creatinine Clearance: 23.5 mL/min (by C-G formula based on Cr of 3.01). No results for input(s): VANCOTROUGH, VANCOPEAK, VANCORANDOM, GENTTROUGH, GENTPEAK, GENTRANDOM, TOBRATROUGH, TOBRAPEAK, TOBRARND, AMIKACINPEAK, AMIKACINTROU, AMIKACIN in the last 72 hours.   Microbiology: Recent Results (from the past 720 hour(s))  Blood culture (routine x 2)     Status: None (Preliminary result)   Collection Time: 09/05/15  3:00 PM  Result Value Ref Range Status   Specimen Description BLOOD RIGHT ARM  Final   Special Requests BOTTLES DRAWN AEROBIC AND ANAEROBIC 5CC  Final   Culture NO GROWTH < 24 HOURS  Final   Report Status PENDING  Incomplete  Blood culture (routine x 2)     Status: None (Preliminary result)   Collection Time: 09/05/15  4:43 PM  Result Value Ref Range Status   Specimen Description BLOOD LEFT HAND  Final   Special Requests BOTTLES DRAWN AEROBIC AND ANAEROBIC 4CC  Final   Culture NO GROWTH < 24 HOURS  Final   Report Status PENDING  Incomplete  MRSA PCR Screening     Status: None   Collection Time: 09/05/15  8:28 PM  Result Value Ref  Range Status   MRSA by PCR NEGATIVE NEGATIVE Final    Comment:        The GeneXpert MRSA Assay (FDA approved for NASAL specimens only), is one component of a comprehensive MRSA colonization surveillance program. It is not intended to diagnose MRSA infection nor to guide or monitor treatment for MRSA infections.   Urine culture     Status: None   Collection Time: 09/05/15 10:20 PM  Result Value Ref Range Status   Specimen Description URINE, RANDOM  Final   Special Requests NONE  Final   Culture 3,000 COLONIES/mL INSIGNIFICANT GROWTH  Final   Report Status 09/07/2015 FINAL  Final  Culture, blood (routine x 2)     Status: None (Preliminary result)   Collection Time: 09/07/15  9:27 AM  Result Value Ref Range Status   Specimen Description BLOOD RIGHT HAND  Final   Special Requests BOTTLES DRAWN AEROBIC AND ANAEROBIC 10CC  Final   Culture PENDING  Incomplete   Report Status PENDING  Incomplete    Anti-infectives    Start     Dose/Rate Route Frequency Ordered Stop   09/07/15 1800  vancomycin (VANCOCIN) IVPB 750 mg/150 ml premix     750 mg 150 mL/hr over 60 Minutes Intravenous Every 24 hours 09/07/15 1141     09/07/15 1730  vancomycin (VANCOCIN) IVPB  1000 mg/200 mL premix  Status:  Discontinued     1,000 mg 200 mL/hr over 60 Minutes Intravenous Every 48 hours 09/05/15 1712 09/07/15 1141   09/06/15 2200  piperacillin-tazobactam (ZOSYN) IVPB 3.375 g     3.375 g 12.5 mL/hr over 240 Minutes Intravenous 3 times per day 09/06/15 1304     09/06/15 1000  fluconazole (DIFLUCAN) IVPB 100 mg     100 mg 50 mL/hr over 60 Minutes Intravenous Every 24 hours 09/05/15 1947     09/05/15 2200  fluconazole (DIFLUCAN) IVPB 200 mg     200 mg 100 mL/hr over 60 Minutes Intravenous  Once 09/05/15 1946 09/05/15 2316   09/05/15 1800  piperacillin-tazobactam (ZOSYN) IVPB 3.375 g  Status:  Discontinued     3.375 g 100 mL/hr over 30 Minutes Intravenous  Once 09/05/15 1758 09/05/15 1800   09/05/15 1800   vancomycin (VANCOCIN) IVPB 1000 mg/200 mL premix  Status:  Discontinued     1,000 mg 200 mL/hr over 60 Minutes Intravenous  Once 09/05/15 1758 09/05/15 1759   09/05/15 1730  piperacillin-tazobactam (ZOSYN) IVPB 2.25 g  Status:  Discontinued     2.25 g 100 mL/hr over 30 Minutes Intravenous 4 times per day 09/05/15 1712 09/06/15 1304   09/05/15 1730  vancomycin (VANCOCIN) IVPB 1000 mg/200 mL premix  Status:  Discontinued     1,000 mg 200 mL/hr over 60 Minutes Intravenous  Once 09/05/15 1712 09/05/15 1715   09/05/15 1730  vancomycin (VANCOCIN) 1,500 mg in sodium chloride 0.9 % 500 mL IVPB     1,500 mg 250 mL/hr over 120 Minutes Intravenous  Once 09/05/15 1715 09/05/15 2026      Assessment: 7 YOM who presented on 10/11 with L-sided numbness/weakness and SIRs concerning for sepsis and was started on Vancomycin + Zosyn. The patient was noted to have AKI now thought to be d/t obstructive nephropathy in the setting of a large retroperitoneal mass. Foley placed 10/12 - SCr trending down, 3.01, CrCl~20-25 ml/min, UOP also increasing. Will adjust Vancomycin dose today.   Goal of Therapy:  Vancomycin trough level 15-20 mcg/ml  Proper antibiotics for infection/cultures adjusted for renal/hepatic function   Plan:  1. Adjust Vancomycin to 750 mg IV every 24 hours 2. Continue Zosyn 3.375g IV every 8 hours (infused over 4 hours) 3. Will continue to follow renal function, culture results, LOT, and antibiotic de-escalation plans   Alycia Rossetti, PharmD, BCPS Clinical Pharmacist Pager: [phone] 09/07/2015 11:44 AM

## 2015-09-07 NOTE — Plan of Care (Signed)
Problem: Phase I Progression Outcomes Goal: Pain controlled with appropriate interventions Outcome: Progressing Pt has denied pain this shift. Goal: OOB as tolerated unless otherwise ordered Outcome: Progressing Pt continues with generalized weakness. He was only able to sit at side of bed this shift.

## 2015-09-07 NOTE — Care Management Note (Signed)
Case Management Note  Patient Details  Name: Carlos Black MRN: [id number] Date of Birth: Dec 20, 1950  Subjective/Objective:   Pt admitted with SIRS                 Action/Plan:  Pt stated he is independent from home with wife.  Pt has been recommended for CIR and CSW will pursue SNF as back up plan.  Pt will have kidney biopsy performed 09/08/15   Expected Discharge Date:  09/06/15               Expected Discharge Plan:  Home/Self Care  In-House Referral:     Discharge planning Services  CM Consult  Post Acute Care Choice:    Choice offered to:     DME Arranged:    DME Agency:     HH Arranged:    HH Agency:     Status of Service:  In process, will continue to follow  Medicare Important Message Given:    Date Medicare IM Given:    Medicare IM give by:    Date Additional Medicare IM Given:    Additional Medicare Important Message give by:     If discussed at [place] of Stay Meetings, dates discussed:    Additional Comments:  Maryclare Labrador, RN 09/07/2015, 12:00 PM

## 2015-09-07 NOTE — Progress Notes (Signed)
Patient ID: Carlos Black, male   DOB: 1951/08/21, 64 y.o.   MRN: [id number]  Request received for RP node bx  Dr Kathlene Cote has reviewed MRI Need CT Chest/Abd/Pelvis completed before can commit To bx   Will keep pt NPO and Hold Hep inj 10/14 for poss procedure

## 2015-09-07 NOTE — Progress Notes (Addendum)
[facility] PROGRESS NOTE  Carlos Black VQM:[id number] DOB: 19-Jan-1967 DOA: 09/05/2015 PCP: [name], MD  Length of stay: 2   Assessment/Plan: Principal Problem:   SIRS (systemic inflammatory response syndrome) (HCC) Active Problems:   Diabetes mellitus (HCC)   Weakness   Hypotension   Hyperkalemia   ARF (acute renal failure) (HCC)   Dehydration   Left sided numbness   H/O: CVA (cerebrovascular accident)   CAD (coronary artery disease)   Oral thrush   Dysphagia   Anemia   Hyponatremia   Weakness of left side of body    #1 systemic inflammatory response syndrome, patient continues to spike a fever of 103 but I suspect that this is   related to his underlying malignancy Patient on admission  noted to have a fever 101.4, tachycardic, tachypneic, and borderline hypotension. Chest x-ray negative for any acute infiltrates. Lactic acid level at 1.99. Urinalysis shows UTI,. Continue antibiotics for another 24 hours if culture remains negative then will DC antibiotics. Follow Panculture.   pro-calcitonin 0.86.   CT chest abdomen pelvis ordered and pending  #2 left-sided numbness and weakness Likely metabolic in nature secondary to problem #1. Infectious source not known at this time. Patient with prior history of CVA risk factors of diabetes hypertension coronary artery disease.    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 Renal ultrasound shows a large prostrate, continue IV fluids and monitor urine output. Tried to insert Foley failed, Discussed with urology about the patient's left retroperitoneal mass, [name]. He has reviewed the MRI and does not feel that there is any evidence of extrinsic compression on the  kidney resulting in renal failure Patient may need nephrology consultation for further evaluation  Anemia In the setting of renal disease, possible neoplasia No bleeding issues are reported No transfusion is indicated at this time  #4 hyperkalemia  will repeat treatment with Kayexalate  #6  oral thrush Continue diflucan.  #7 dysphagia Consult speech therapy for swallow evaluation. We'll keep nothing by mouth for now.  #8 hyponatremia Likely secondary to hypovolemic hyponatremia as patient is noted to have dry mucous membranes and systolic blood pressure was in the 90s on admission.   Will follow for now.  #9 diabetes mellitus   hemoglobin A1c 6.2 . Hold oral hypoglycemic agents. Place on sliding scale insulin.  #10 history of CVA Patient presented with worsening left-sided numbness and weakness. CT head was negative. Patient's symptoms likely metabolic in nature.  MRI of the head negative . Cont  aspirin for secondary stroke prevention.  #11 hyperlipidemia Once tolerating oral intake will resume home regimen of statin.  #12 hypertension Hold antihypertensives for now. Follow.   DVT prophylaxsis   Code Status:      Code Status Orders        Start     Ordered   09/05/15 1958  Do not attempt resuscitation (DNR)   Continuous    Question Answer Comment  In the event of cardiac or respiratory ARREST Do not call a "code blue"   In the event of cardiac or respiratory ARREST Do not perform Intubation, CPR, defibrillation or ACLS   In the event of cardiac or respiratory ARREST Use medication by any route, position,  wound care, and other measures to relive pain and suffering. May use oxygen, suction and manual treatment of airway obstruction as needed for comfort.      09/05/15 1957     Family Communication: family updated about patient's clinical progress Disposition Plan:  As above    Brief narrative: 64 y o with history of CVA with residual left sided weakness, DM,  HTN, CAD who presented to ED with sudden onset of left lower extremity numbness and left-sided weakness on the day of admission. Patient stated that he woke up around 10 or 11 AM went to try to use the commode when he noticed a numbness in his left leg. Patient stated that he was extremely weak and unable to get off the commode and had to have 3 people help him up. Patient also endorses a cough or caring mostly at night over the past 2-3 weeks which is productive in nature with some brownish sputum. Patient denies any fevers, no chills, no nausea, no chest pain, no emesis, no shortness of breath, no abdominal pain, no diarrhea, no constipation, no dysuria, no melena, no hematemesis, no hematochezia, no visual changes, no facial droop, no slurred speech. Patient was in the emergency room CT head which was done was negative. Patient was noted in the ED to have systolic blood pressures in the 90s with a temp of 101.4 respiratory rate in the 30s and tachycardic with heart rate up to 109. Patient was given a liter bolus. Comprehensive metabolic profile obtained had a sodium of 129 potassium of 5.9 chloride of 93 BUN of 37 creatinine of 3.44 albumin of 3.0 AST of 50 bilirubin of 1.3 otherwise was within normal limits. Lactic acid level was 1.99. CBC had a hemoglobin of 11.2 otherwise was within normal limits. Chest x-ray which was done was negative for any acute infiltrate. Urinalysis was pending. Blood cultures were drawn. Patient was given a dose of IV vancomycin and IV Zosyn. [facility] were called to admit the patient for further evaluation and management.  Consultants:  None   Procedures None  Antibiotics: Anti-infectives    Start     Dose/Rate Route Frequency Ordered Stop   09/07/15 1800  vancomycin (VANCOCIN) IVPB 750 mg/150 ml premix     750 mg 150 mL/hr over 60 Minutes Intravenous Every 24 hours 09/07/15 1141     09/07/15 1730  vancomycin (VANCOCIN) IVPB 1000 mg/200 mL premix  Status:   Discontinued     1,000 mg 200 mL/hr over 60 Minutes Intravenous Every 48 hours 09/05/15 1712 09/07/15 1141   09/06/15 2200  piperacillin-tazobactam (ZOSYN) IVPB 3.375 g     3.375 g 12.5 mL/hr over 240 Minutes Intravenous 3 times per day 09/06/15 1304     09/06/15 1000  fluconazole (DIFLUCAN) IVPB 100 mg     100 mg 50 mL/hr over 60 Minutes Intravenous Every 24 hours 09/05/15 1947     09/05/15 2200  fluconazole (DIFLUCAN) IVPB 200 mg     200 mg 100 mL/hr over 60 Minutes Intravenous  Once 09/05/15 1946 09/05/15 2316   09/05/15 1800  piperacillin-tazobactam (ZOSYN) IVPB 3.375 g  Status:  Discontinued     3.375 g 100 mL/hr over 30 Minutes Intravenous  Once 09/05/15 1758 09/05/15 1800   09/05/15 1800  vancomycin (VANCOCIN) IVPB 1000 mg/200 mL premix  Status:  Discontinued     1,000 mg 200 mL/hr over 60 Minutes Intravenous  Once 09/05/15 1758 09/05/15 1759   09/05/15 1730  piperacillin-tazobactam (ZOSYN) IVPB  2.25 g  Status:  Discontinued     2.25 g 100 mL/hr over 30 Minutes Intravenous 4 times per day 09/05/15 1712 09/06/15 1304   09/05/15 1730  vancomycin (VANCOCIN) IVPB 1000 mg/200 mL premix  Status:  Discontinued     1,000 mg 200 mL/hr over 60 Minutes Intravenous  Once 09/05/15 1712 09/05/15 1715   09/05/15 1730  vancomycin (VANCOCIN) 1,500 mg in sodium chloride 0.9 % 500 mL IVPB     1,500 mg 250 mL/hr over 120 Minutes Intravenous  Once 09/05/15 1715 09/05/15 2026         HPI/Subjective: Left leg numbness and inability to walk   Objective: Filed Vitals:   09/07/15 0507 09/07/15 0800 09/07/15 1000 09/07/15 1117  BP: 119/70 143/74    Pulse: 86 98    Temp: 100.1 F (37.8 C) 103.4 F (39.7 C) 100.1 F (37.8 C) 98.3 F (36.8 C)  TempSrc: Oral Oral Oral Oral  Resp: 21 36    Height:      Weight: 77.5 kg (170 lb 13.7 oz)     SpO2: 97% 94%      Intake/Output Summary (Last 24 hours) at 09/07/15 1332 Last data filed at 09/07/15 0800  Gross per 24 hour  Intake    955 ml   Output   1600 ml  Net   -645 ml    Exam:  General: No acute respiratory distress Lungs: Clear to auscultation bilaterally without wheezes or crackles Cardiovascular: Regular rate and rhythm without murmur gallop or rub normal S1 and S2 Abdomen: Nontender, nondistended, soft, bowel sounds positive, no rebound, no ascites, no appreciable mass Extremities: No significant cyanosis, clubbing, or edema bilateral lower extremities     Data Review   Micro Results Recent Results (from the past 240 hour(s))  Blood culture (routine x 2)     Status: None (Preliminary result)   Collection Time: 09/05/15  3:00 PM  Result Value Ref Range Status   Specimen Description BLOOD RIGHT ARM  Final   Special Requests BOTTLES DRAWN AEROBIC AND ANAEROBIC 5CC  Final   Culture NO GROWTH < 24 HOURS  Final   Report Status PENDING  Incomplete  Blood culture (routine x 2)     Status: None (Preliminary result)   Collection Time: 09/05/15  4:43 PM  Result Value Ref Range Status   Specimen Description BLOOD LEFT HAND  Final   Special Requests BOTTLES DRAWN AEROBIC AND ANAEROBIC 4CC  Final   Culture NO GROWTH < 24 HOURS  Final   Report Status PENDING  Incomplete  MRSA PCR Screening     Status: None   Collection Time: 09/05/15  8:28 PM  Result Value Ref Range Status   MRSA by PCR NEGATIVE NEGATIVE Final    Comment:        The GeneXpert MRSA Assay (FDA approved for NASAL specimens only), is one component of a comprehensive MRSA colonization surveillance program. It is not intended to diagnose MRSA infection nor to guide or monitor treatment for MRSA infections.   Urine culture     Status: None   Collection Time: 09/05/15 10:20 PM  Result Value Ref Range Status   Specimen Description URINE, RANDOM  Final   Special Requests NONE  Final   Culture 3,000 COLONIES/mL INSIGNIFICANT GROWTH  Final   Report Status 09/07/2015 FINAL  Final  Culture, blood (routine x 2)     Status: None (Preliminary result)    Collection Time: 09/07/15  9:27 AM  Result Value  Ref Range Status   Specimen Description BLOOD RIGHT HAND  Final   Special Requests BOTTLES DRAWN AEROBIC AND ANAEROBIC 10CC  Final   Culture PENDING  Incomplete   Report Status PENDING  Incomplete    Radiology Reports Dg Chest 2 View  09/05/2015  CLINICAL DATA:  64 year old with acute onset of left lower extremity paresis/numbness which began earlier today. Fever. Current history of hypertension and diabetes. Prior stroke and MI. EXAM: CHEST  2 VIEW COMPARISON:  07/26/2004, 07/20/2004. FINDINGS: AP semi-erect and lateral images were obtained. Chronic elevation of the right hemidiaphragm with chronic scar/atelectasis involving the right lower lobe and right middle lobe, unchanged. Lungs otherwise clear. No localized airspace consolidation. No pleural effusions. No pneumothorax. Normal pulmonary vascularity. Cardiac silhouette upper normal in size for AP technique. Thoracic aorta mildly atherosclerotic and tortuous, unchanged. Hilar and mediastinal contours otherwise unremarkable. Visualized bony thorax intact with only mild degenerative changes in the mid thoracic region. IMPRESSION: 1.  No acute cardiopulmonary disease. 2. Stable chronic elevation of the right hemidiaphragm and chronic scar/atelectasis involving the right lower lobe and right middle lobe. Electronically Signed   By: Evangeline Dakin M.D.   On: 09/05/2015 15:26   Ct Head Wo Contrast  09/05/2015  CLINICAL DATA:  Left-sided weakness from previous CVA, weakness has increased over the last day with difficulty walking EXAM: CT HEAD WITHOUT CONTRAST TECHNIQUE: Contiguous axial images were obtained from the base of the skull through the vertex without intravenous contrast. COMPARISON:  12/21/2011 FINDINGS: The bony calvarium is intact. Atrophic changes are noted. No findings to suggest acute hemorrhage, acute infarction or space-occupying mass lesion are noted. IMPRESSION: Atrophic changes  without acute abnormality. Electronically Signed   By: Inez Catalina M.D.   On: 09/05/2015 16:37   Mr Brain Wo Contrast  09/06/2015  CLINICAL DATA:  LEFT lower extremity numbness and LEFT-sided weakness for 2 days. Night cough for 2-3 weeks. History of stroke and LEFT-sided weakness, diabetes, hypertension, coronary artery disease. EXAM: MRI HEAD WITHOUT CONTRAST TECHNIQUE: Multiplanar, multiecho pulse sequences of the brain and surrounding structures were obtained without intravenous contrast. COMPARISON:  CT head September 05, 2015 at 1626 hours FINDINGS: Multiple sequences are mild or moderately motion degraded. No reduced diffusion to suggest acute ischemia. No susceptibility artifact to suggest hemorrhage. Ventricles and sulci are normal for patient's age. Old RIGHT ventral pons infarct. Old RIGHT basal ganglia lacunar infarct. Mild RIGHT cerebral peduncle volume loss compatible with wallerian degeneration. Mild white matter changes compatible with chronic small vessel ischemic disease, decreased sensitivity due to patient motion. No abnormal extra-axial fluid collections. Dolicoectatic intracranial vessel flow voids seen at the skull base. Ocular globes and orbital contents are grossly normal though motion degraded. Paranasal sinuses and mastoid air cells are well aerated. No abnormal sellar expansion. No is abnormal cerebellar tonsillar descent below the foramen magnum. No suspicious bone marrow signal. IMPRESSION: No acute intracranial process on this motion degraded examination. Involutional changes and mild chronic small vessel ischemic disease. Old RIGHT pontine infarct. Old RIGHT basal ganglia lacunar infarct. Electronically Signed   By: Elon Alas M.D.   On: 09/06/2015 02:55   Mr Lumbar Spine Wo Contrast  09/06/2015  CLINICAL DATA:  LEFT lower extremity numbness and LEFT-sided weakness for 2 days. History of stroke in LEFT-sided weakness, diabetes, hypertension, coronary artery disease.  EXAM: MRI LUMBAR SPINE WITHOUT CONTRAST TECHNIQUE: Multiplanar, multisequence MR imaging of the lumbar spine was performed. No intravenous contrast was administered. COMPARISON:  None. FINDINGS: Lumbar vertebral bodies  and posterior elements are intact and aligned with maintenance of lumbar lordosis. Intervertebral discs demonstrate normal morphology, with slight decreased T2 signal within the lower lumbar disc consistent with mild desiccation. Minimal subacute discogenic endplate changes at all lumbar levels, no STIR signal abnormality to suggest acute osseous process. Conus medullaris terminates at L1 and appears normal in morphology and signal characteristics. Limited assessment of cauda equina due to patient motion. LEFT abdominal mass partially imaged, with mass effect on the LEFT psoas muscle. Probable lymphadenopathy along LEFT Common iliac chain. Probable obstructive uropathy on the LEFT. Partially imaged sub cm gallstone. Level by level evaluation: L1-2 and L2-3: No significant disc bulge, canal stenosis or neural foraminal narrowing. L3-4: Small broad-based disc bulge, mild facet and ligamentum flavum redundancy without canal stenosis. Minimal bilateral neural foraminal narrowing. L4-5: Small broad-based disc bulge asymmetric to LEFT. Mild facet arthropathy and ligamentum flavum redundancy without canal stenosis. Minimal LEFT neural foraminal narrowing. L5-S1: No disc bulge, canal stenosis nor neural foraminal narrowing. IMPRESSION: No acute lumbar spine fracture nor malalignment. No canal stenosis. Minimal L3-4 and L4-5 neural foraminal narrowing. Large LEFT retroperitoneal mass with mass effect on the LEFT psoas muscle and probable lymphadenopathy along LEFT iliac chain. Recommend CT of the abdomen and pelvis with contrast. Mass appears to result in LEFT obstructive uropathy. Electronically Signed   By: Elon Alas M.D.   On: 09/06/2015 23:18   US Renal  09/06/2015  CLINICAL DATA:  Acute renal  failure. EXAM: RENAL / URINARY TRACT ULTRASOUND COMPLETE COMPARISON:  None. FINDINGS: Right Kidney: Length: 9.6 cm. Heterogeneous increased parenchymal echotexture with parenchymal thinning suggesting chronic medical renal disease. No hydronephrosis or solid mass. Left Kidney: Length: 9.8 cm. Heterogeneous increased parenchymal echotexture likely indicating chronic medical renal disease. Appears to be circumscribed hypoechoic appearance of the lower pole of the left kidney. This may indicate edema versus abscess. No hydronephrosis or solid mass identified. Bladder: No bladder wall thickening or filling defect. Bilateral urine flow jets are demonstrated on color flow Doppler imaging. Incidental note of mildly enlarged prostate gland measuring 3.6 x 3.6 x 3.3 cm. IMPRESSION: Heterogeneous parenchymal echotexture bilaterally suggesting chronic medical renal disease. Hypoechoic appearance of the lower pole left kidney could indicate edema, pyelonephritis, or abscess. No hydronephrosis in either kidney. Mild prostate enlargement. Electronically Signed   By: Lucienne Capers M.D.   On: 09/06/2015 03:19   Portable Chest 1 View  09/06/2015  CLINICAL DATA:  Fever. EXAM: PORTABLE CHEST 1 VIEW COMPARISON:  09/05/2015.  07/20/2004. FINDINGS: Mediastinum and hilar structures are normal. Heart size stable. Persistent right lower lobe subsegmental atelectasis and/or scarring. No pleural effusion pneumothorax. IMPRESSION: Stable right base subsegmental atelectasis and/or scarring. No acute cardiopulmonary disease. Electronically Signed   By: Marcello Moores  Register   On: 09/06/2015 07:19     CBC  Recent Labs Lab 09/05/15 1500 09/06/15 0242 09/07/15 0917  WBC 5.9 4.2 4.3  HGB 11.2* 9.8* 9.3*  HCT 34.3* 30.7* 28.6*  PLT 267 205 180  MCV 75.9* 77.3* 77.3*  MCH 24.8* 24.7* 25.1*  MCHC 32.7 31.9 32.5  RDW 16.1* 16.2* 16.5*  LYMPHSABS 0.7  --  0.9  MONOABS 1.6*  --  0.8  EOSABS 0.1  --  0.2  BASOSABS 0.0  --  0.0     Chemistries   Recent Labs Lab 09/05/15 1500 09/05/15 1837 09/05/15 2043 09/06/15 0242 09/07/15 0917  NA 129*  --  132* 135 135  K 5.9*  --  5.0 5.0 5.2*  CL 93*  --  98* 100* 105  CO2 25  --  23 20* 21*  GLUCOSE 150*  --  117* 116* 103*  BUN 37*  --  32* 31* 24*  CREATININE 3.44*  --  3.24* 3.14* 3.01*  CALCIUM 9.2  --  8.2* 8.4* 8.2*  MG  --  1.6* 1.8 2.3  --   AST 50*  --   --  34 41  ALT 21  --   --  19 20  ALKPHOS 79  --   --  61 64  BILITOT 1.3*  --   --  1.0 1.0   ------------------------------------------------------------------------------------------------------------------ estimated creatinine clearance is 23.5 mL/min (by C-G formula based on Cr of 3.01). ------------------------------------------------------------------------------------------------------------------  Recent Labs  09/05/15 2043  HGBA1C 6.2*   ------------------------------------------------------------------------------------------------------------------ No results for input(s): CHOL, HDL, LDLCALC, TRIG, CHOLHDL, LDLDIRECT in the last 72 hours. ------------------------------------------------------------------------------------------------------------------  Recent Labs  09/05/15 2043  TSH 2.894   ------------------------------------------------------------------------------------------------------------------ No results for input(s): VITAMINB12, FOLATE, FERRITIN, TIBC, IRON, RETICCTPCT in the last 72 hours.  Coagulation profile  Recent Labs Lab 09/05/15 1500  INR 1.26    No results for input(s): DDIMER in the last 72 hours.  Cardiac Enzymes  Recent Labs Lab 09/05/15 1837  TROPONINI <0.03   ------------------------------------------------------------------------------------------------------------------ Invalid input(s): POCBNP   CBG:  Recent Labs Lab 09/06/15 1205 09/06/15 1629 09/06/15 2107 09/07/15 0623 09/07/15 1116  GLUCAP 79 99 105* 72 113*        Studies: Dg Chest 2 View  09/05/2015  CLINICAL DATA:  64 year old with acute onset of left lower extremity paresis/numbness which began earlier today. Fever. Current history of hypertension and diabetes. Prior stroke and MI. EXAM: CHEST  2 VIEW COMPARISON:  07/26/2004, 07/20/2004. FINDINGS: AP semi-erect and lateral images were obtained. Chronic elevation of the right hemidiaphragm with chronic scar/atelectasis involving the right lower lobe and right middle lobe, unchanged. Lungs otherwise clear. No localized airspace consolidation. No pleural effusions. No pneumothorax. Normal pulmonary vascularity. Cardiac silhouette upper normal in size for AP technique. Thoracic aorta mildly atherosclerotic and tortuous, unchanged. Hilar and mediastinal contours otherwise unremarkable. Visualized bony thorax intact with only mild degenerative changes in the mid thoracic region. IMPRESSION: 1.  No acute cardiopulmonary disease. 2. Stable chronic elevation of the right hemidiaphragm and chronic scar/atelectasis involving the right lower lobe and right middle lobe. Electronically Signed   By: Evangeline Dakin M.D.   On: 09/05/2015 15:26   Ct Head Wo Contrast  09/05/2015  CLINICAL DATA:  Left-sided weakness from previous CVA, weakness has increased over the last day with difficulty walking EXAM: CT HEAD WITHOUT CONTRAST TECHNIQUE: Contiguous axial images were obtained from the base of the skull through the vertex without intravenous contrast. COMPARISON:  12/21/2011 FINDINGS: The bony calvarium is intact. Atrophic changes are noted. No findings to suggest acute hemorrhage, acute infarction or space-occupying mass lesion are noted. IMPRESSION: Atrophic changes without acute abnormality. Electronically Signed   By: Inez Catalina M.D.   On: 09/05/2015 16:37   Mr Brain Wo Contrast  09/06/2015  CLINICAL DATA:  LEFT lower extremity numbness and LEFT-sided weakness for 2 days. Night cough for 2-3 weeks. History of  stroke and LEFT-sided weakness, diabetes, hypertension, coronary artery disease. EXAM: MRI HEAD WITHOUT CONTRAST TECHNIQUE: Multiplanar, multiecho pulse sequences of the brain and surrounding structures were obtained without intravenous contrast. COMPARISON:  CT head September 05, 2015 at 1626 hours FINDINGS: Multiple sequences are mild or moderately motion degraded. No reduced diffusion to suggest acute ischemia. No susceptibility artifact to  suggest hemorrhage. Ventricles and sulci are normal for patient's age. Old RIGHT ventral pons infarct. Old RIGHT basal ganglia lacunar infarct. Mild RIGHT cerebral peduncle volume loss compatible with wallerian degeneration. Mild white matter changes compatible with chronic small vessel ischemic disease, decreased sensitivity due to patient motion. No abnormal extra-axial fluid collections. Dolicoectatic intracranial vessel flow voids seen at the skull base. Ocular globes and orbital contents are grossly normal though motion degraded. Paranasal sinuses and mastoid air cells are well aerated. No abnormal sellar expansion. No is abnormal cerebellar tonsillar descent below the foramen magnum. No suspicious bone marrow signal. IMPRESSION: No acute intracranial process on this motion degraded examination. Involutional changes and mild chronic small vessel ischemic disease. Old RIGHT pontine infarct. Old RIGHT basal ganglia lacunar infarct. Electronically Signed   By: Elon Alas M.D.   On: 09/06/2015 02:55   Mr Lumbar Spine Wo Contrast  09/06/2015  CLINICAL DATA:  LEFT lower extremity numbness and LEFT-sided weakness for 2 days. History of stroke in LEFT-sided weakness, diabetes, hypertension, coronary artery disease. EXAM: MRI LUMBAR SPINE WITHOUT CONTRAST TECHNIQUE: Multiplanar, multisequence MR imaging of the lumbar spine was performed. No intravenous contrast was administered. COMPARISON:  None. FINDINGS: Lumbar vertebral bodies and posterior elements are intact and  aligned with maintenance of lumbar lordosis. Intervertebral discs demonstrate normal morphology, with slight decreased T2 signal within the lower lumbar disc consistent with mild desiccation. Minimal subacute discogenic endplate changes at all lumbar levels, no STIR signal abnormality to suggest acute osseous process. Conus medullaris terminates at L1 and appears normal in morphology and signal characteristics. Limited assessment of cauda equina due to patient motion. LEFT abdominal mass partially imaged, with mass effect on the LEFT psoas muscle. Probable lymphadenopathy along LEFT Common iliac chain. Probable obstructive uropathy on the LEFT. Partially imaged sub cm gallstone. Level by level evaluation: L1-2 and L2-3: No significant disc bulge, canal stenosis or neural foraminal narrowing. L3-4: Small broad-based disc bulge, mild facet and ligamentum flavum redundancy without canal stenosis. Minimal bilateral neural foraminal narrowing. L4-5: Small broad-based disc bulge asymmetric to LEFT. Mild facet arthropathy and ligamentum flavum redundancy without canal stenosis. Minimal LEFT neural foraminal narrowing. L5-S1: No disc bulge, canal stenosis nor neural foraminal narrowing. IMPRESSION: No acute lumbar spine fracture nor malalignment. No canal stenosis. Minimal L3-4 and L4-5 neural foraminal narrowing. Large LEFT retroperitoneal mass with mass effect on the LEFT psoas muscle and probable lymphadenopathy along LEFT iliac chain. Recommend CT of the abdomen and pelvis with contrast. Mass appears to result in LEFT obstructive uropathy. Electronically Signed   By: Elon Alas M.D.   On: 09/06/2015 23:18   US Renal  09/06/2015  CLINICAL DATA:  Acute renal failure. EXAM: RENAL / URINARY TRACT ULTRASOUND COMPLETE COMPARISON:  None. FINDINGS: Right Kidney: Length: 9.6 cm. Heterogeneous increased parenchymal echotexture with parenchymal thinning suggesting chronic medical renal disease. No hydronephrosis or  solid mass. Left Kidney: Length: 9.8 cm. Heterogeneous increased parenchymal echotexture likely indicating chronic medical renal disease. Appears to be circumscribed hypoechoic appearance of the lower pole of the left kidney. This may indicate edema versus abscess. No hydronephrosis or solid mass identified. Bladder: No bladder wall thickening or filling defect. Bilateral urine flow jets are demonstrated on color flow Doppler imaging. Incidental note of mildly enlarged prostate gland measuring 3.6 x 3.6 x 3.3 cm. IMPRESSION: Heterogeneous parenchymal echotexture bilaterally suggesting chronic medical renal disease. Hypoechoic appearance of the lower pole left kidney could indicate edema, pyelonephritis, or abscess. No hydronephrosis in either kidney. Mild prostate  enlargement. Electronically Signed   By: Lucienne Capers M.D.   On: 09/06/2015 03:19   Portable Chest 1 View  09/06/2015  CLINICAL DATA:  Fever. EXAM: PORTABLE CHEST 1 VIEW COMPARISON:  09/05/2015.  07/20/2004. FINDINGS: Mediastinum and hilar structures are normal. Heart size stable. Persistent right lower lobe subsegmental atelectasis and/or scarring. No pleural effusion pneumothorax. IMPRESSION: Stable right base subsegmental atelectasis and/or scarring. No acute cardiopulmonary disease. Electronically Signed   By: Marcello Moores  Register   On: 09/06/2015 07:19      Lab Results  Component Value Date   HGBA1C 6.2* 09/05/2015   HGBA1C 13.2* 12/22/2011   Lab Results  Component Value Date   CREATININE 3.01* 09/07/2015       Scheduled Meds: . aspirin  81 mg Oral Daily  . feeding supplement (GLUCERNA SHAKE)  237 mL Oral TID BM  . fluconazole (DIFLUCAN) IV  100 mg Intravenous Q24H  . guaiFENesin  1,200 mg Oral BID  . heparin  5,000 Units Subcutaneous 3 times per day  . insulin aspart  0-9 Units Subcutaneous TID WC  . pantoprazole (PROTONIX) IV  40 mg Intravenous Q24H  . piperacillin-tazobactam (ZOSYN)  IV  3.375 g Intravenous 3 times per  day  . sodium chloride  3 mL Intravenous Q12H  . sodium polystyrene  45 g Rectal Once  . vancomycin  750 mg Intravenous Q24H   Continuous Infusions: . sodium chloride 75 mL/hr at 09/06/15 2032    Principal Problem:   SIRS (systemic inflammatory response syndrome) (HCC) Active Problems:   Diabetes mellitus (HCC)   Weakness   Hypotension   Hyperkalemia   ARF (acute renal failure) (HCC)   Dehydration   Left sided numbness   H/O: CVA (cerebrovascular accident)   CAD (coronary artery disease)   Oral thrush   Dysphagia   Anemia   Hyponatremia   Weakness of left side of body    Time spent: 45 minutes   [facility] Pager [phone]. If 7PM-7AM, please contact night-coverage at [email or web address], password [facility] 09/07/2015, 1:32 PM  LOS: 2 days

## 2015-09-07 NOTE — Consult Note (Signed)
[place]  Telephone:([phone]) [place] NOTE  Mabry Tift                                MR#: [id number]  DOB: 1951/07/19                       CSN#: [id number]  Referring MD: Dr. Sheliah Plane [facility]  Patient Care Team: Lavone Orn, MD as PCP - General (Internal Medicine)  Reason for Consult: Retroperitoneal mass   PHK:FEXMDY Hoffer is a 64 y.o. male with multiple medical issues listed below, admitted on 09/05/15 with sudden onset of left sided weakness and numbness, with no other associated symptoms other than recent cold with cough production.He was febrile with temp up to 101.4. He was tachycardic and tachypneic. Denies chills, night sweats or mucositis. Denies hemoptysis. Denies any chest pain or irregular heart beat. Denies lower extremity swelling. Denies nausea, heartburn or change in bowel habits. Appetite is normal. Denies any dysuria but did notice some hesitancy. Denies abnormal skin rashes. Denies any bleeding issues such as epistaxis, hematemesis, hematuria or hematochezia. Denies headaches, seizures, or vision changes. No confusion is reported. Cultures are currently pending. He was placed on IV antibiotics. A chest x-ray was negative for acute disease.He did have acute renal failure as well. He was admitted for management of SIRS as well as for further evaluation of his left-sided numbness and weakness, As he carries a prior history of CVA. CT and MRI  of the head were negative for acute changes or metastatic disease.Renal ultrasound on 09/06/2015 and revealed a mildly enlarged prostate gland, As well as chronic renal disease.  MR Lumbar spine on 10/12 revealed a Large LEFT retroperitoneal mass with mass effect on the LEFT psoas muscle and probable lymphadenopathy along LEFT iliac chain. He was placed on IV Decadron with some reversibility of symptoms. Staging CTof the chest, abdomen and pelvis on 10/13 revealed a bulky  left-sided 14 x 9.7 x 8.7 cm retroperitoneal mass ,obstructing the left ureter and causing left-sided hydronephrosis.  There are also bilateral adrenal gland masses. No obvious testicular mass per imaging, but palpable on exam. No significant findings in the chest. No mediastinal or hilar mass or adenopathy.He is to undergo biopsy of the left retroperitoneal mass on 10/14, will await for the results, at which time will proceed with recommendations Denies risk for Hepatitis or HIV. Patient denies any history or family history of malignancies. Never had a bone marrow biopsy in the past.          PMH:  Past Medical History  Diagnosis Date  . Hypertension   . Coronary artery disease   . Myocardial infarction ([place])   . CAD (coronary artery disease) 09/05/2015  . SIRS (systemic inflammatory response syndrome) ([place]) 09/06/2015  . Acute renal failure (ARF) ([place]) 09/06/2015  . Type II diabetes mellitus ([place])   . Stroke [facility]) 2004    w/left sided weakness/notes 09/05/2015    Surgeries:  Past Surgical History  Procedure Laterality Date  . Basilar arterty stent  07/25/2004     Dr. Abel Presto Deveshwar/notes 02/01/2011    Allergies: No Known Allergies  Medications:  Scheduled Meds: . aspirin  81 mg Oral Daily  . feeding supplement (GLUCERNA SHAKE)  237 mL Oral TID BM  . fluconazole (DIFLUCAN) IV  100 mg Intravenous Q24H  . guaiFENesin  1,200 mg Oral  BID  . heparin  5,000 Units Subcutaneous 3 times per day  . insulin aspart  0-9 Units Subcutaneous TID WC  . pantoprazole (PROTONIX) IV  40 mg Intravenous Q24H  . piperacillin-tazobactam (ZOSYN)  IV  3.375 g Intravenous 3 times per day  . sodium chloride  3 mL Intravenous Q12H  . sodium polystyrene  30 g Oral Once  . vancomycin  750 mg Intravenous Q24H   Continuous Infusions: . sodium chloride 75 mL/hr at 09/06/15 2032   PRN Meds:.acetaminophen, alum & mag hydroxide-simeth, ondansetron **OR** ondansetron (ZOFRAN) IV, oxyCODONE, sodium  phosphate, sorbitol  ROS: Constitutional: positive for  Fevers on admission, without chills or abnormal night sweats Eyes: Denies blurriness of vision, double vision or watery eyes Ears, nose, mouth, throat, and face: Denies mucositis or sore throat Respiratory: positive for cough on admission, denies dyspnea or wheezes Cardiovascular: Denies palpitation, chest discomfort or lower extremity swelling Gastrointestinal:  Denies nausea, heartburn. He reports some diarrhea Skin: Denies abnormal skin rashes Lymphatics: Denies new lymphadenopathy or easy bruising Neurological:positive for left lower extremity numbness, notingling  Behavioral/Psych: Mood is stable, no new changes  All other systems were reviewed with the patient and are negative.   Family History:    Family History  Problem Relation Age of Onset  . Diabetes Mellitus II Mother   . Pneumonia Father   . Diabetes Father     No family history of hematological  disorders.  Social History:  reports that he has never smoked. He does not have any smokeless tobacco history on file. He reports that he does not drink alcohol or use illicit drugs. Live sin [place]. Married. He is second hand smoker   Physical Exam:  Filed Vitals:   09/07/15 1402  BP: 112/65  Pulse: 81  Temp: 99.2 F (37.3 C)  Resp: 24   Filed Weights   09/05/15 1958 09/06/15 0455 09/07/15 0507  Weight: 161 lb 9.6 oz (73.3 kg) 169 lb 12.1 oz (77 kg) 170 lb 13.7 oz (77.5 kg)    GENERAL:alert, no distress and comfortable, discsheveled appearance. SKIN: skin color, texture, turgor are normal, no rashes or significant lesions EYES: normal, conjunctiva are pink and non-injected, sclera clear OROPHARYNX:no exudate, no erythema and lips, buccal mucosa, and tongue normal  NECK: supple, thyroid normal size, non-tender, without nodularity LYMPH:  no palpable lymphadenopathy in the cervical, axillary or inguinal area LUNGS:bilateral expiratory wheezing with mild  tachypnea HEART: regular rate & rhythm and no murmurs and no lower extremity edema ABDOMEN:protuberant, non-tender and normal bowel sounds. Cannot palpate the left retroperitoneal mass. Bilateral inguinal hernias noted-reducible GU: right testicle normal, left testicular, large mass, non tender.  Musculoskeletal:no cyanosis of digits and no clubbing  PSYCH: alert with fluent speech, appears mildly confused. NEURO: left sided weakness, 3/5. Right side is normal. Sensation is intact   Labs:  CBC   Recent Labs Lab 09/05/15 1500 09/06/15 0242 09/07/15 0917  WBC 5.9 4.2 4.3  HGB 11.2* 9.8* 9.3*  HCT 34.3* 30.7* 28.6*  PLT 267 205 180  MCV 75.9* 77.3* 77.3*  MCH 24.8* 24.7* 25.1*  MCHC 32.7 31.9 32.5  RDW 16.1* 16.2* 16.5*  LYMPHSABS 0.7  --  0.9  MONOABS 1.6*  --  0.8  EOSABS 0.1  --  0.2  BASOSABS 0.0  --  0.0     CMP    Recent Labs Lab 09/05/15 1500 09/05/15 1837 09/05/15 2043 09/06/15 0242 09/07/15 0917  NA 129*  --  132* 135 135  K  5.9*  --  5.0 5.0 5.2*  CL 93*  --  98* 100* 105  CO2 25  --  23 20* 21*  GLUCOSE 150*  --  117* 116* 103*  BUN 37*  --  32* 31* 24*  CREATININE 3.44*  --  3.24* 3.14* 3.01*  CALCIUM 9.2  --  8.2* 8.4* 8.2*  MG  --  1.6* 1.8 2.3  --   AST 50*  --   --  34 41  ALT 21  --   --  19 20  ALKPHOS 79  --   --  61 64  BILITOT 1.3*  --   --  1.0 1.0        Component Value Date/Time   BILITOT 1.0 09/07/2015 0917      Recent Labs Lab 09/05/15 1500  INR 1.26    No results for input(s): DDIMER in the last 72 hours.   Anemia panel:  No results for input(s): VITAMINB12, FOLATE, FERRITIN, TIBC, IRON, RETICCTPCT in the last 72 hours.   Imaging Studies:  Ct Abdomen Pelvis Wo Contrast  09/07/2015  CLINICAL DATA:  Evaluate retroperitoneal mass seen on recent MRI of the lumbar spine. EXAM: CT CHEST, ABDOMEN AND PELVIS WITHOUT CONTRAST TECHNIQUE: Multidetector CT imaging of the chest, abdomen and pelvis was performed following  the standard protocol without IV contrast. COMPARISON:  Lumbar spine MRI 09/06/2015 and renal ultrasound 09/06/2015 FINDINGS: CT CHEST FINDINGS Mediastinum/Lymph Nodes: No chest wall mass, supraclavicular or axillary lymphadenopathy. Small scattered lymph nodes are noted. The thyroid gland is grossly normal. The heart is normal in size. No pericardial effusion. The aorta is normal in caliber. Mild tortuosity and scattered atherosclerotic calcifications. There are extensive 3 vessel coronary artery calcifications. Moderate distal esophageal wall thickening. Could not exclude neoplasm or esophagitis. No paraesophageal lymphadenopathy. Lungs/Pleura: Limited examination due to breathing motion artifact. There is a small left pleural effusion and areas of atelectasis. Rounded pleural density noted posteriorly at the right lung apex measures -45 Hounsfield units and is probably a small benign pleural lipoma. No worrisome pulmonary lesions to suggest a primary lung neoplasm or metastatic disease. Musculoskeletal: No significant bony findings. CT ABDOMEN PELVIS FINDINGS Hepatobiliary: No obvious hepatic lesions without contrast. Mild hepatomegaly. The gallbladder is distended and there are small gallstones noted. Pancreas: No pancreatic mass, inflammation or ductal dilatation. Spleen: Upper limits of normal.  No focal lesions. Adrenals/Urinary Tract: Bilateral adrenal gland masses. The right measures a maximum of 37.5 mm and the left 31 mm. No obvious renal mass. There is left-sided hydronephrosis due to a large left-sided retroperitoneal mass which is surrounding the left ureter and lower pole region of the left kidney. I do not see a fat plane between this mass and the aorta. It extends down into the upper left pelvis. This could reflect aggressive lymphoma or retroperitoneal sarcoma. The mass measures approximately 16.4 x 9.7 x 8.7 cm. Recommend image guided biopsy. PET-CT may also be helpful for further evaluation and  accurate staging. Stomach/Bowel: The stomach, duodenum, small bowel and colon are grossly normal. No inflammatory changes, mass lesions or obstructive findings. The terminal ileum is normal. Vascular/Lymphatic: Large left-sided retroperitoneal mass as discussed above. Moderate atherosclerotic calcifications involving the aorta but no aneurysm. Reproductive: The bladder, prostate gland and seminal vesicles are unremarkable. Other: 3.6 x 2.1 cm internal iliac nodal mass on the left side. Small amount of free the pelvic fluid and presacral edema. Bilateral inguinal hernias containing fat and fluid and small bowel on the  right side. Suspect undescended testicle in the left inguinal canal. This would raise the possibility of testicular cancer. No inguinal adenopathy. Musculoskeletal: No significant bony findings. IMPRESSION: 1. Bulky left-sided retroperitoneal mass which is obstructing the left ureter and causing left-sided hydronephrosis. There are also bilateral adrenal gland masses. Findings could be due to aggressive lymphoma, retroperitoneal sarcoma or possibly testicular carcinoma given the fact that it appears the patients left testicle is in the left inguinal canal. No obvious testicular mass. Scrotal ultrasound may be helpful for further evaluation. Image guided Biopsy is recommended of the left retroperitoneal mass. 2. No significant findings in the chest. No evidence of a primary lung neoplasm or metastatic pulmonary disease. No mediastinal or hilar mass or adenopathy. 3. Extensive 3 vessel coronary artery calcifications. 4. Small left pleural effusion. 5. Distended gallbladder and cholelithiasis. 6. Bilateral inguinal hernias containing fat, fluid and small bowel on the right side. Electronically Signed   By: Marijo Sanes M.D.   On: 09/07/2015 13:35   Dg Chest 2 View  09/05/2015  CLINICAL DATA:  64 year old with acute onset of left lower extremity paresis/numbness which began earlier today. Fever.  Current history of hypertension and diabetes. Prior stroke and MI. EXAM: CHEST  2 VIEW COMPARISON:  07/26/2004, 07/20/2004. FINDINGS: AP semi-erect and lateral images were obtained. Chronic elevation of the right hemidiaphragm with chronic scar/atelectasis involving the right lower lobe and right middle lobe, unchanged. Lungs otherwise clear. No localized airspace consolidation. No pleural effusions. No pneumothorax. Normal pulmonary vascularity. Cardiac silhouette upper normal in size for AP technique. Thoracic aorta mildly atherosclerotic and tortuous, unchanged. Hilar and mediastinal contours otherwise unremarkable. Visualized bony thorax intact with only mild degenerative changes in the mid thoracic region. IMPRESSION: 1.  No acute cardiopulmonary disease. 2. Stable chronic elevation of the right hemidiaphragm and chronic scar/atelectasis involving the right lower lobe and right middle lobe. Electronically Signed   By: Evangeline Dakin M.D.   On: 09/05/2015 15:26   Ct Head Wo Contrast  09/05/2015  CLINICAL DATA:  Left-sided weakness from previous CVA, weakness has increased over the last day with difficulty walking EXAM: CT HEAD WITHOUT CONTRAST TECHNIQUE: Contiguous axial images were obtained from the base of the skull through the vertex without intravenous contrast. COMPARISON:  12/21/2011 FINDINGS: The bony calvarium is intact. Atrophic changes are noted. No findings to suggest acute hemorrhage, acute infarction or space-occupying mass lesion are noted. IMPRESSION: Atrophic changes without acute abnormality. Electronically Signed   By: Inez Catalina M.D.   On: 09/05/2015 16:37   Ct Chest Wo Contrast  09/07/2015  CLINICAL DATA:  Evaluate retroperitoneal mass seen on recent MRI of the lumbar spine. EXAM: CT CHEST, ABDOMEN AND PELVIS WITHOUT CONTRAST TECHNIQUE: Multidetector CT imaging of the chest, abdomen and pelvis was performed following the standard protocol without IV contrast. COMPARISON:  Lumbar  spine MRI 09/06/2015 and renal ultrasound 09/06/2015 FINDINGS: CT CHEST FINDINGS Mediastinum/Lymph Nodes: No chest wall mass, supraclavicular or axillary lymphadenopathy. Small scattered lymph nodes are noted. The thyroid gland is grossly normal. The heart is normal in size. No pericardial effusion. The aorta is normal in caliber. Mild tortuosity and scattered atherosclerotic calcifications. There are extensive 3 vessel coronary artery calcifications. Moderate distal esophageal wall thickening. Could not exclude neoplasm or esophagitis. No paraesophageal lymphadenopathy. Lungs/Pleura: Limited examination due to breathing motion artifact. There is a small left pleural effusion and areas of atelectasis. Rounded pleural density noted posteriorly at the right lung apex measures -45 Hounsfield units and is probably a small  benign pleural lipoma. No worrisome pulmonary lesions to suggest a primary lung neoplasm or metastatic disease. Musculoskeletal: No significant bony findings. CT ABDOMEN PELVIS FINDINGS Hepatobiliary: No obvious hepatic lesions without contrast. Mild hepatomegaly. The gallbladder is distended and there are small gallstones noted. Pancreas: No pancreatic mass, inflammation or ductal dilatation. Spleen: Upper limits of normal.  No focal lesions. Adrenals/Urinary Tract: Bilateral adrenal gland masses. The right measures a maximum of 37.5 mm and the left 31 mm. No obvious renal mass. There is left-sided hydronephrosis due to a large left-sided retroperitoneal mass which is surrounding the left ureter and lower pole region of the left kidney. I do not see a fat plane between this mass and the aorta. It extends down into the upper left pelvis. This could reflect aggressive lymphoma or retroperitoneal sarcoma. The mass measures approximately 16.4 x 9.7 x 8.7 cm. Recommend image guided biopsy. PET-CT may also be helpful for further evaluation and accurate staging. Stomach/Bowel: The stomach, duodenum, small  bowel and colon are grossly normal. No inflammatory changes, mass lesions or obstructive findings. The terminal ileum is normal. Vascular/Lymphatic: Large left-sided retroperitoneal mass as discussed above. Moderate atherosclerotic calcifications involving the aorta but no aneurysm. Reproductive: The bladder, prostate gland and seminal vesicles are unremarkable. Other: 3.6 x 2.1 cm internal iliac nodal mass on the left side. Small amount of free the pelvic fluid and presacral edema. Bilateral inguinal hernias containing fat and fluid and small bowel on the right side. Suspect undescended testicle in the left inguinal canal. This would raise the possibility of testicular cancer. No inguinal adenopathy. Musculoskeletal: No significant bony findings. IMPRESSION: 1. Bulky left-sided retroperitoneal mass which is obstructing the left ureter and causing left-sided hydronephrosis. There are also bilateral adrenal gland masses. Findings could be due to aggressive lymphoma, retroperitoneal sarcoma or possibly testicular carcinoma given the fact that it appears the patients left testicle is in the left inguinal canal. No obvious testicular mass. Scrotal ultrasound may be helpful for further evaluation. Image guided Biopsy is recommended of the left retroperitoneal mass. 2. No significant findings in the chest. No evidence of a primary lung neoplasm or metastatic pulmonary disease. No mediastinal or hilar mass or adenopathy. 3. Extensive 3 vessel coronary artery calcifications. 4. Small left pleural effusion. 5. Distended gallbladder and cholelithiasis. 6. Bilateral inguinal hernias containing fat, fluid and small bowel on the right side. Electronically Signed   By: Marijo Sanes M.D.   On: 09/07/2015 13:35   Mr Brain Wo Contrast  09/06/2015  CLINICAL DATA:  LEFT lower extremity numbness and LEFT-sided weakness for 2 days. Night cough for 2-3 weeks. History of stroke and LEFT-sided weakness, diabetes, hypertension,  coronary artery disease. EXAM: MRI HEAD WITHOUT CONTRAST TECHNIQUE: Multiplanar, multiecho pulse sequences of the brain and surrounding structures were obtained without intravenous contrast. COMPARISON:  CT head September 05, 2015 at 1626 hours FINDINGS: Multiple sequences are mild or moderately motion degraded. No reduced diffusion to suggest acute ischemia. No susceptibility artifact to suggest hemorrhage. Ventricles and sulci are normal for patient's age. Old RIGHT ventral pons infarct. Old RIGHT basal ganglia lacunar infarct. Mild RIGHT cerebral peduncle volume loss compatible with wallerian degeneration. Mild white matter changes compatible with chronic small vessel ischemic disease, decreased sensitivity due to patient motion. No abnormal extra-axial fluid collections. Dolicoectatic intracranial vessel flow voids seen at the skull base. Ocular globes and orbital contents are grossly normal though motion degraded. Paranasal sinuses and mastoid air cells are well aerated. No abnormal sellar expansion. No is abnormal  cerebellar tonsillar descent below the foramen magnum. No suspicious bone marrow signal. IMPRESSION: No acute intracranial process on this motion degraded examination. Involutional changes and mild chronic small vessel ischemic disease. Old RIGHT pontine infarct. Old RIGHT basal ganglia lacunar infarct. Electronically Signed   By: Elon Alas M.D.   On: 09/06/2015 02:55   Mr Lumbar Spine Wo Contrast  09/06/2015  CLINICAL DATA:  LEFT lower extremity numbness and LEFT-sided weakness for 2 days. History of stroke in LEFT-sided weakness, diabetes, hypertension, coronary artery disease. EXAM: MRI LUMBAR SPINE WITHOUT CONTRAST TECHNIQUE: Multiplanar, multisequence MR imaging of the lumbar spine was performed. No intravenous contrast was administered. COMPARISON:  None. FINDINGS: Lumbar vertebral bodies and posterior elements are intact and aligned with maintenance of lumbar lordosis. Intervertebral  discs demonstrate normal morphology, with slight decreased T2 signal within the lower lumbar disc consistent with mild desiccation. Minimal subacute discogenic endplate changes at all lumbar levels, no STIR signal abnormality to suggest acute osseous process. Conus medullaris terminates at L1 and appears normal in morphology and signal characteristics. Limited assessment of cauda equina due to patient motion. LEFT abdominal mass partially imaged, with mass effect on the LEFT psoas muscle. Probable lymphadenopathy along LEFT Common iliac chain. Probable obstructive uropathy on the LEFT. Partially imaged sub cm gallstone. Level by level evaluation: L1-2 and L2-3: No significant disc bulge, canal stenosis or neural foraminal narrowing. L3-4: Small broad-based disc bulge, mild facet and ligamentum flavum redundancy without canal stenosis. Minimal bilateral neural foraminal narrowing. L4-5: Small broad-based disc bulge asymmetric to LEFT. Mild facet arthropathy and ligamentum flavum redundancy without canal stenosis. Minimal LEFT neural foraminal narrowing. L5-S1: No disc bulge, canal stenosis nor neural foraminal narrowing. IMPRESSION: No acute lumbar spine fracture nor malalignment. No canal stenosis. Minimal L3-4 and L4-5 neural foraminal narrowing. Large LEFT retroperitoneal mass with mass effect on the LEFT psoas muscle and probable lymphadenopathy along LEFT iliac chain. Recommend CT of the abdomen and pelvis with contrast. Mass appears to result in LEFT obstructive uropathy. Electronically Signed   By: Elon Alas M.D.   On: 09/06/2015 23:18   US Renal  09/06/2015  CLINICAL DATA:  Acute renal failure. EXAM: RENAL / URINARY TRACT ULTRASOUND COMPLETE COMPARISON:  None. FINDINGS: Right Kidney: Length: 9.6 cm. Heterogeneous increased parenchymal echotexture with parenchymal thinning suggesting chronic medical renal disease. No hydronephrosis or solid mass. Left Kidney: Length: 9.8 cm. Heterogeneous increased  parenchymal echotexture likely indicating chronic medical renal disease. Appears to be circumscribed hypoechoic appearance of the lower pole of the left kidney. This may indicate edema versus abscess. No hydronephrosis or solid mass identified. Bladder: No bladder wall thickening or filling defect. Bilateral urine flow jets are demonstrated on color flow Doppler imaging. Incidental note of mildly enlarged prostate gland measuring 3.6 x 3.6 x 3.3 cm. IMPRESSION: Heterogeneous parenchymal echotexture bilaterally suggesting chronic medical renal disease. Hypoechoic appearance of the lower pole left kidney could indicate edema, pyelonephritis, or abscess. No hydronephrosis in either kidney. Mild prostate enlargement. Electronically Signed   By: Lucienne Capers M.D.   On: 09/06/2015 03:19   Portable Chest 1 View  09/06/2015  CLINICAL DATA:  Fever. EXAM: PORTABLE CHEST 1 VIEW COMPARISON:  09/05/2015.  07/20/2004. FINDINGS: Mediastinum and hilar structures are normal. Heart size stable. Persistent right lower lobe subsegmental atelectasis and/or scarring. No pleural effusion pneumothorax. IMPRESSION: Stable right base subsegmental atelectasis and/or scarring. No acute cardiopulmonary disease. Electronically Signed   By: Marcello Moores  Register   On: 09/06/2015 07:19  A/P: 64 y.o. male   Left retroperitoneal mass Bilateral adrenal masses Left testicular mass MR Lumbar spine on 10/12 revealed a Large LEFT retroperitoneal mass with mass effect on the LEFT psoas muscle and probable lymphadenopathy along LEFT iliac chain.  Staging CTof the chest, abdomen and pelvis on 10/13 revealed a bulky left-sided 14 x 9.7 x 8.7 cm retroperitoneal mass ,obstructing the left ureter and causing left-sided hydronephrosis.  There are also bilateral adrenal gland masses.  No obvious testicular mass per imaging, but palpable on exam.  No significant findings in the chest. No mediastinal or hilar mass or adenopathy. He is to  undergo biopsy of the left retroperitoneal mass on 10/14, will await for the results, at which time will proceed with recommmendations In the interim, recommend Urology evaluation for evaluation of hydronephrosis Check testicular ultrasound,  AFP and BHCG;will follow results.  Left-sided weakness and numbness Likely due to the left retroperitoneal mass found in MRI of the L-spine with involvement of the psoas muscle, complicating underlying left-sided weakness from a previous CVA     Anemia-microcytic Check ferritin  Renal Failure In the setting of abnormal masses found, dehydration Continue IV hydration with close monitoring of urine output Renal to see    UTI He was febrile with temp up to 101.4. He was tachycardic and tachypneic. Chest x-ray was negative for acute infiltrates UA is positive for UTI   DVT prophylaxis On Heparin sq May use SCDs if biopsy to be performed  DNR    Rondel Jumbo, PA-C 09/07/2015 3:32 PM  Mr. Camplin was interviewed and examined. I reviewed the CTs from today.  He presents with left leg weakness that is chronic with acute worsening. 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 A brain MRI and lumbar spine MRI revealed no etiology for left leg weakness.  He has a large left retroperitoneal mass, adrenal masses, and a left testicular mass. 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  Recommendations: 1. Biopsy of the retroperitoneal mass 2. Urology consult/testicular ultrasound 3. Check ferritin 4. AFP and beta hCG tumor markers 5. I will check on him 09/08/2015. Please call oncology as needed over the weekend.

## 2015-09-08 ENCOUNTER — Inpatient Hospital Stay (HOSPITAL_COMMUNITY): Payer: Medicare HMO

## 2015-09-08 ENCOUNTER — Other Ambulatory Visit: Payer: Self-pay | Admitting: Urology

## 2015-09-08 DIAGNOSIS — R509 Fever, unspecified: Secondary | ICD-10-CM

## 2015-09-08 DIAGNOSIS — D649 Anemia, unspecified: Secondary | ICD-10-CM

## 2015-09-08 LAB — GLUCOSE, CAPILLARY
GLUCOSE-CAPILLARY: 196 mg/dL — AB (ref 65–99)
GLUCOSE-CAPILLARY: 171 mg/dL — AB (ref 65–99)
Glucose-Capillary: 241 mg/dL — ABNORMAL HIGH (ref 65–99)
Glucose-Capillary: 245 mg/dL — ABNORMAL HIGH (ref 65–99)

## 2015-09-08 LAB — CBC
HCT: 28.1 % — ABNORMAL LOW (ref 39.0–52.0)
Hemoglobin: 9.2 g/dL — ABNORMAL LOW (ref 13.0–17.0)
MCH: 25.2 pg — ABNORMAL LOW (ref 26.0–34.0)
MCHC: 32.7 g/dL (ref 30.0–36.0)
MCV: 77 fL — ABNORMAL LOW (ref 78.0–100.0)
PLATELETS: 189 10*3/uL (ref 150–400)
RBC: 3.65 MIL/uL — AB (ref 4.22–5.81)
RDW: 16.6 % — AB (ref 11.5–15.5)
WBC: 3.8 10*3/uL — AB (ref 4.0–10.5)

## 2015-09-08 LAB — COMPREHENSIVE METABOLIC PANEL
ALBUMIN: 2.4 g/dL — AB (ref 3.5–5.0)
ALT: 22 U/L (ref 17–63)
AST: 35 U/L (ref 15–41)
Alkaline Phosphatase: 63 U/L (ref 38–126)
Anion gap: 13 (ref 5–15)
BUN: 30 mg/dL — AB (ref 6–20)
CHLORIDE: 98 mmol/L — AB (ref 101–111)
CO2: 22 mmol/L (ref 22–32)
CREATININE: 3.19 mg/dL — AB (ref 0.61–1.24)
Calcium: 7.8 mg/dL — ABNORMAL LOW (ref 8.9–10.3)
GFR calc Af Amer: 22 mL/min — ABNORMAL LOW (ref >60)
GFR calc non Af Amer: 19 mL/min — ABNORMAL LOW (ref >60)
GLUCOSE: 225 mg/dL — AB (ref 65–99)
POTASSIUM: 4.3 mmol/L (ref 3.5–5.1)
Sodium: 133 mmol/L — ABNORMAL LOW (ref 135–145)
TOTAL PROTEIN: 6.5 g/dL (ref 6.5–8.1)
Total Bilirubin: 0.8 mg/dL (ref 0.3–1.2)

## 2015-09-08 LAB — BETA HCG QUANT (REF LAB): Beta hCG, Tumor Marker: 1 m[IU]/mL (ref 0–3)

## 2015-09-08 LAB — AFP TUMOR MARKER: AFP TUMOR MARKER: 1 ng/mL (ref 0.0–8.3)

## 2015-09-08 MED ORDER — FENTANYL CITRATE (PF) 100 MCG/2ML IJ SOLN
INTRAMUSCULAR | Status: AC | PRN
  Administered 2015-09-08: 25 ug via INTRAVENOUS

## 2015-09-08 MED ORDER — FENTANYL CITRATE (PF) 100 MCG/2ML IJ SOLN
INTRAMUSCULAR | Status: AC
  Filled 2015-09-08: qty 4

## 2015-09-08 MED ORDER — LIDOCAINE HCL 1 % IJ SOLN
INTRAMUSCULAR | Status: AC
  Filled 2015-09-08: qty 20

## 2015-09-08 MED ORDER — DEXAMETHASONE SODIUM PHOSPHATE 4 MG/ML IJ SOLN
4.0000 mg | Freq: Four times a day (QID) | INTRAMUSCULAR | Status: DC
  Administered 2015-09-08 – 2015-09-11 (×13): 4 mg via INTRAVENOUS
  Filled 2015-09-08 (×14): qty 1

## 2015-09-08 MED ORDER — MIDAZOLAM HCL 2 MG/2ML IJ SOLN
INTRAMUSCULAR | Status: AC
  Filled 2015-09-08: qty 4

## 2015-09-08 MED ORDER — MIDAZOLAM HCL 2 MG/2ML IJ SOLN
INTRAMUSCULAR | Status: AC | PRN
  Administered 2015-09-08: 1 mg via INTRAVENOUS

## 2015-09-08 NOTE — Sedation Documentation (Signed)
Patient is resting comfortably. 

## 2015-09-08 NOTE — Care Management Note (Addendum)
Case Management Note  Patient Details  Name: Carlos Black MRN: [id number] Date of Birth: 1950/12/24  Subjective/Objective:   Pt admitted with SIRS                 Action/Plan:  Pt stated he is independent from home with wife.  Pt has been recommended for CIR and CSW will pursue SNF as back up plan.  Pt will have kidney biopsy performed 09/08/15   Expected Discharge Date:  09/06/15               Expected Discharge Plan:  Home/Self Care  In-House Referral:     Discharge planning Services  CM Consult  Post Acute Care Choice:    Choice offered to:     DME Arranged:    DME Agency:     HH Arranged:    HH Agency:     Status of Service:  In process, will continue to follow  Medicare Important Message Given:  Yes-second notification given Date Medicare IM Given:    Medicare IM give by:    Date Additional Medicare IM Given:    Additional Medicare Important Message give by:     If discussed at [place] of Stay Meetings, dates discussed:    Additional Comments: 09/08/2015 Pt had biopsy performed earlier today 09/08/2015,  CM contacted CIR for status update of consult post procedures.  CIR requested official consult, CM requested consult from MD.  [facility] working back up plan for SNF in case pt is clinically ready for discharge prior to CIR approval.   CM will continue to monitor.  09/07/15 CIR screened pt on 09/06/15; CIR requesting consult post procedures.  CSW working back up plan for SNF in case pt is clinically ready for discharge prior to CIR approval. Maryclare Labrador, RN 09/08/2015, 2:25 PM

## 2015-09-08 NOTE — Progress Notes (Signed)
Physical Therapy Treatment Patient Details Name: Carlos Black MRN: [id number] DOB: November 04, 1951 Today's Date: 09/08/2015    History of Present Illness 64 y o with history of CVA with residual left sided weakness, DM, HTN, CAD who presented to ED with sudden onset of left lower extremity numbness and left-sided weakness on the day of admission. Patient stated that he was extremely weak and unable to get off the commode and had to have 3 people help him up.    PT Comments    Pt's strength slowly improving.  Emphasized standing/pregait activity, pt showing poor ability to w/shift, with stepping strategy used to manage balance and w/shift.  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  Transfers Overall transfer level: Needs assistance Equipment used: Rolling walker (2 wheeled) Transfers: Sit to/from Stand Sit to Stand: Max assist Stand pivot transfers: Max assist          Ambulation/Gait Ambulation/Gait assistance: Mod assist;+2 safety/equipment Ambulation Distance (Feet): 45 Feet Assistive device: Rolling walker (2 wheeled) Gait Pattern/deviations: Step-to pattern;Decreased step length - left;Decreased stride length   Gait velocity interpretation: Below normal speed for age/gender General Gait Details: pt with hemiparetic gait on the Left with RW for extra support.  Assted control of the RW and also w/shift on occasion   Stairs            Wheelchair Mobility    Modified Rankin (Stroke Patients  Only)       Balance Overall balance assessment: Needs assistance Sitting-balance support: No upper extremity supported;Single extremity supported Sitting balance-Leahy Scale: Fair Sitting balance - Comments: could maintain balance EOB and scoot assymetrically    Standing balance support: Bilateral upper extremity supported Standing balance-Leahy Scale: Poor Standing balance comment: tended to use steppage strategy to control balance.  Poor w/shift L or R.                    Cognition Arousal/Alertness: Awake/alert Behavior During Therapy: Flat affect Overall Cognitive Status: History of cognitive impairments - at baseline                      Exercises      General Comments        Pertinent Vitals/Pain Pain Assessment: No/denies pain    Home Living                      Prior Function            PT Goals (current goals can now be found in the care plan section) Acute Rehab PT Goals Patient Stated Goal: Get better and become more independent. PT Goal Formulation: With patient Time For Goal Achievement: 09/20/15 Potential to Achieve Goals: Good Progress towards PT goals: Progressing toward goals    Frequency  Min 4X/week    PT Plan Current plan remains appropriate    Co-evaluation             End of Session   Activity Tolerance: Other (comment) (Treatment  limited due to transport arrived for biopsy proced) Patient left: in bed;with call bell/phone within reach;with nursing/sitter in room     Time: 1200-1225 PT Time Calculation (min) (ACUTE ONLY): 25 min  Charges:  $Gait Training: 8-22 mins $Therapeutic Activity: 8-22 mins                    G Codes:      Teniola Tseng, Tessie Fass 09/08/2015, 1:17 PM   09/08/2015  Donnella Sham, [place] [phone]  (pager)

## 2015-09-08 NOTE — Progress Notes (Signed)
Occupational Therapy Treatment Patient Details Name: Carlos Black MRN: [id number] DOB: 23-Feb-1951 Today's Date: 09/08/2015    History of present illness 64 y o with history of CVA with residual left sided weakness, DM, HTN, CAD who presented to ED with sudden onset of left lower extremity numbness and left-sided weakness on the day of admission. Patient stated that he was extremely weak and unable to get off the commode and had to have 3 people help him up.   OT comments  Pt still with significant LUE and LLE weakness from previous CVA.  Needs total assist for functional transfers as well as max to total assist for sit to stand depending on height of surface he is standing from.  Recommend continued OT at CIR level to progress back to a baseline.  Will continue with current OT treatment plan.    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   Transfers Overall transfer level: Needs assistance Equipment used: 1 person hand held assist Transfers: Sit to/from [facility] Sit to Stand: Max assist Stand pivot transfers: Max assist            Balance     Sitting balance-Leahy Scale: Poor Sitting balance - Comments: Pt with increased LOB posteriorly when attempting to donn gripper sock over the LLE by crossing the leg.      Standing balance-Leahy Scale: Zero                     ADL Overall ADL's : Needs assistance/impaired                      Lower Body Dressing: Moderate assistance;Sitting/lateral leans Lower Body Dressing Details (indicate cue type and reason): For donning gripper socks only. Toilet Transfer: Maximal assistance;Stand-pivot   Toileting- Clothing Manipulation and Hygiene: +2 for physical assistance;Maximal assistance;Sit to/from stand Toileting - Clothing Manipulation Details (indicate cue type and reason): simulated       General ADL Comments: Pt with increased trunk tightness and limited mobility for lateral weightshifts during scooting.  Increased lean to the right with head tilt when attempting to scoot.  Resistant to weightshift to the left to unweight right hip for scooting.  Pt initially needing total assist for sit to stand as he would continually move his RLE instead of pushing through the floor with it.  Donned left AFO and shoes for sit to stand after noticing severe left knee hyperextension in standing.  Still with increased hyper-extension with sit to stand improving to mod assist when bed was elevated.                  Cognition   Behavior During Therapy: Flat affect Overall Cognitive Status: History of cognitive impairments - at baseline                                    Pertinent  Vitals/ Pain       Pain Assessment: No/denies pain         Frequency Min 3X/week     Progress Toward Goals  OT Goals(current goals can now be found in the care plan section)  Progress towards OT goals: Progressing toward goals     Plan Discharge plan remains appropriate       End of Session Equipment Utilized During Treatment: Gait belt   Activity Tolerance Patient tolerated treatment well   Patient Left in chair;with call bell/phone within reach   Nurse Communication Mobility status        Time: 5436-0677 OT Time Calculation (min): 34 min  Charges: OT General Charges $OT Visit: 1 Procedure OT Treatments $Neuromuscular Re-education: 23-37  mins  Sybel Standish OTR/L 09/08/2015, 10:21 AM

## 2015-09-08 NOTE — Progress Notes (Signed)
Results for IMAAD, REUSS (MRN [id number]) as of 09/08/2015 09:24  Ref. Range 09/07/2015 06:23 09/07/2015 11:16 09/07/2015 17:09 09/07/2015 20:57 09/08/2015 06:18  Glucose-Capillary Latest Ref Range: 65-99 mg/dL 72 113 (H) 176 (H) 276 (H) 196 (H)  Recommend changing Novolog SENSITIVE correction scale to every 4 hours while NPO, then TID & HS when eating.  Will continue to monitor blood sugars while in the hospital. Harvel Ricks RN BSN CDE

## 2015-09-08 NOTE — Care Management Important Message (Signed)
Important Message  Patient Details  Name: Carlos Black MRN: [id number] Date of Birth: 08-27-1951   Medicare Important Message Given:  Yes-second notification given    Nathen May 09/08/2015, 10:18 AM

## 2015-09-08 NOTE — Sedation Documentation (Signed)
Patient is resting comfortably.  Denies pain.

## 2015-09-08 NOTE — Sedation Documentation (Signed)
Patient denies pain and is resting comfortably.  

## 2015-09-08 NOTE — Progress Notes (Addendum)
[facility] PROGRESS NOTE  Carlos Black IHK:[id number] DOB: 18-Dec-1950 DOA: 09/05/2015 PCP: [name], MD  Length of stay: 3   Assessment/Plan: Principal Problem:   SIRS (systemic inflammatory response syndrome) (HCC) Active Problems:   Diabetes mellitus (HCC)   Weakness   Hypotension   Hyperkalemia   ARF (acute renal failure) (HCC)   Dehydration   Left sided numbness   H/O: CVA (cerebrovascular accident)   CAD (coronary artery disease)   Oral thrush   Dysphagia   Anemia   Hyponatremia   Weakness of left side of body   Pelvic mass in male    #1 Tumor fever vs UTI  No fever since yesterday , Patient on admission  noted to have a fever 101.4, tachycardic, tachypneic, and borderline hypotension. Chest x-ray negative for any acute infiltrates. Lactic acid level at 1.99. Urinalysis shows UTI,. Continue antibiotics for another 24 hours if culture remains negative then will DC antibiotics. Follow Panculture.   pro-calcitonin 0.86.   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   Patient with prior history of CVA risk factors of diabetes hypertension coronary artery disease.    MRI negative for acute CVA , shows old right pontine infarct MRI of the lumbar spine on 10/12 showed a large left retroperitoneal mass with mass effect on the left psoas muscle, oncology consulted, Started on Decadron yesterday per oncology recommendations, continue Decadron 4 mg IV every 6  #3 acute renal failure In the setting of left-sided hydronephrosis, bladder outlet obstruction secondary to large prostrate, continue IV fluids and monitor urine output. Tried to insert  Foley but failed, would appreciate if urology could place a Foley Discussed with urology about the patient's left retroperitoneal mass, [name] on 10/13. He  reviewed the MRI and does not feel that there is any evidence of extrinsic compression on the kidney resulting in renal failure based on the MRI However CT abdomen pelvis shows left-sided hydronephrosis Discussed with [name] on 10/14 and they will see the patient today and place a Foley, ureteral stent if needed  Testicular mass, tumor markers negative, urology to evaluate testicular mass  Anemia In the setting of renal disease, possible neoplasia No bleeding issues are reported No transfusion is indicated at this time  #4 hyperkalemia  will repeat treatment with Kayexalate  #6  oral thrush Continue diflucan.  #7 dysphagia Consult speech therapy for swallow evaluation. We'll keep nothing by mouth for now.  #8 hyponatremia Likely secondary to hypovolemic hyponatremia , improving  #9 diabetes mellitus   hemoglobin A1c 6.2 . Hold oral hypoglycemic agents. Place on sliding scale insulin.  #10 history of CVA Patient presented with worsening left-sided numbness and weakness. CT head was negative.    #11 hyperlipidemia Once tolerating oral intake will resume home regimen of statin.  #12 hypertension Hold antihypertensives for now. Follow.   DVT prophylaxsis heparin  Code Status:      Code Status Orders        Start     Ordered   09/05/15 1958  Do not attempt resuscitation (DNR)   Continuous    Question Answer Comment  In the event of cardiac or respiratory ARREST Do not call a "code blue"   In  the event of cardiac or respiratory ARREST Do not perform Intubation, CPR, defibrillation or ACLS   In the event of cardiac or respiratory ARREST Use medication by any route, position, wound care, and other measures to relive pain and suffering. May use oxygen, suction and manual treatment of airway obstruction as needed  for comfort.      09/05/15 1957     Family Communication: family updated about patient's clinical progress Disposition Plan: SNF versus CIR    Brief narrative: 64 y o with history of CVA with residual left sided weakness, DM, HTN, CAD who presented to ED with sudden onset of left lower extremity numbness and left-sided weakness on the day of admission. Patient stated that he woke up around 10 or 11 AM went to try to use the commode when he noticed a numbness in his left leg. Patient stated that he was extremely weak and unable to get off the commode and had to have 3 people help him up. Patient also endorses a cough or caring mostly at night over the past 2-3 weeks which is productive in nature with some brownish sputum. Patient denies any fevers, no chills, no nausea, no chest pain, no emesis, no shortness of breath, no abdominal pain, no diarrhea, no constipation, no dysuria, no melena, no hematemesis, no hematochezia, no visual changes, no facial droop, no slurred speech. Patient was in the emergency room CT head which was done was negative. Patient was noted in the ED to have systolic blood pressures in the 90s with a temp of 101.4 respiratory rate in the 30s and tachycardic with heart rate up to 109. Patient was given a liter bolus. Comprehensive metabolic profile obtained had a sodium of 129 potassium of 5.9 chloride of 93 BUN of 37 creatinine of 3.44 albumin of 3.0 AST of 50 bilirubin of 1.3 otherwise was within normal limits. Lactic acid level was 1.99. CBC had a hemoglobin of 11.2 otherwise was within normal limits. Chest x-ray which was done was negative for any acute infiltrate. Urinalysis was pending. Blood cultures were drawn. Patient was given a dose of IV vancomycin and IV Zosyn. [facility] were called to admit the patient for further evaluation and management.  Consultants:  None   Procedures None  Antibiotics: Anti-infectives    Start     Dose/Rate Route Frequency  Ordered Stop   09/07/15 1800  vancomycin (VANCOCIN) IVPB 750 mg/150 ml premix     750 mg 150 mL/hr over 60 Minutes Intravenous Every 24 hours 09/07/15 1141     09/07/15 1730  vancomycin (VANCOCIN) IVPB 1000 mg/200 mL premix  Status:  Discontinued     1,000 mg 200 mL/hr over 60 Minutes Intravenous Every 48 hours 09/05/15 1712 09/07/15 1141   09/06/15 2200  piperacillin-tazobactam (ZOSYN) IVPB 3.375 g     3.375 g 12.5 mL/hr over 240 Minutes Intravenous 3 times per day 09/06/15 1304     09/06/15 1000  fluconazole (DIFLUCAN) IVPB 100 mg     100 mg 50 mL/hr over 60 Minutes Intravenous Every 24 hours 09/05/15 1947     09/05/15 2200  fluconazole (DIFLUCAN) IVPB 200 mg     200 mg 100 mL/hr over 60 Minutes Intravenous  Once 09/05/15 1946 09/05/15 2316   09/05/15 1800  piperacillin-tazobactam (ZOSYN) IVPB 3.375 g  Status:  Discontinued     3.375 g 100 mL/hr over 30 Minutes Intravenous  Once 09/05/15 1758 09/05/15 1800   09/05/15 1800  vancomycin (VANCOCIN) IVPB 1000 mg/200 mL  premix  Status:  Discontinued     1,000 mg 200 mL/hr over 60 Minutes Intravenous  Once 09/05/15 1758 09/05/15 1759   09/05/15 1730  piperacillin-tazobactam (ZOSYN) IVPB 2.25 g  Status:  Discontinued     2.25 g 100 mL/hr over 30 Minutes Intravenous 4 times per day 09/05/15 1712 09/06/15 1304   09/05/15 1730  vancomycin (VANCOCIN) IVPB 1000 mg/200 mL premix  Status:  Discontinued     1,000 mg 200 mL/hr over 60 Minutes Intravenous  Once 09/05/15 1712 09/05/15 1715   09/05/15 1730  vancomycin (VANCOCIN) 1,500 mg in sodium chloride 0.9 % 500 mL IVPB     1,500 mg 250 mL/hr over 120 Minutes Intravenous  Once 09/05/15 1715 09/05/15 2026         HPI/Subjective: No fever since yesterday,  Objective: Filed Vitals:   09/08/15 1315 09/08/15 1321 09/08/15 1326 09/08/15 1336  BP: 111/79 100/73  103/73  Pulse: 88 87  83  Temp:      TempSrc:      Resp: 26 22  18   Height:      Weight:      SpO2: 97% 97% 92% 95%     Intake/Output Summary (Last 24 hours) at 09/08/15 1427 Last data filed at 09/08/15 1141  Gross per 24 hour  Intake    240 ml  Output   2453 ml  Net  -2213 ml    Exam:  General: No acute respiratory distress Lungs: Clear to auscultation bilaterally without wheezes or crackles Cardiovascular: Regular rate and rhythm without murmur gallop or rub normal S1 and S2 Abdomen: Nontender, nondistended, soft, bowel sounds positive, no rebound, no ascites, no appreciable mass Extremities: No significant cyanosis, clubbing, or edema bilateral lower extremities     Data Review   Micro Results Recent Results (from the past 240 hour(s))  Blood culture (routine x 2)     Status: None (Preliminary result)   Collection Time: 09/05/15  3:00 PM  Result Value Ref Range Status   Specimen Description BLOOD RIGHT ARM  Final   Special Requests BOTTLES DRAWN AEROBIC AND ANAEROBIC 5CC  Final   Culture NO GROWTH 2 DAYS  Final   Report Status PENDING  Incomplete  Blood culture (routine x 2)     Status: None (Preliminary result)   Collection Time: 09/05/15  4:43 PM  Result Value Ref Range Status   Specimen Description BLOOD LEFT HAND  Final   Special Requests BOTTLES DRAWN AEROBIC AND ANAEROBIC 4CC  Final   Culture NO GROWTH 2 DAYS  Final   Report Status PENDING  Incomplete  MRSA PCR Screening     Status: None   Collection Time: 09/05/15  8:28 PM  Result Value Ref Range Status   MRSA by PCR NEGATIVE NEGATIVE Final    Comment:        The GeneXpert MRSA Assay (FDA approved for NASAL specimens only), is one component of a comprehensive MRSA colonization surveillance program. It is not intended to diagnose MRSA infection nor to guide or monitor treatment for MRSA infections.   Urine culture     Status: None   Collection Time: 09/05/15 10:20 PM  Result Value Ref Range Status   Specimen Description URINE, RANDOM  Final   Special Requests NONE  Final   Culture 3,000 COLONIES/mL INSIGNIFICANT  GROWTH  Final   Report Status 09/07/2015 FINAL  Final  Culture, blood (routine x 2)     Status: None (Preliminary result)   Collection Time:  09/07/15  9:27 AM  Result Value Ref Range Status   Specimen Description BLOOD RIGHT HAND  Final   Special Requests BOTTLES DRAWN AEROBIC AND ANAEROBIC 10CC  Final   Culture PENDING  Incomplete   Report Status PENDING  Incomplete    Radiology Reports Ct Abdomen Pelvis Wo Contrast  09/07/2015  CLINICAL DATA:  Evaluate retroperitoneal mass seen on recent MRI of the lumbar spine. EXAM: CT CHEST, ABDOMEN AND PELVIS WITHOUT CONTRAST TECHNIQUE: Multidetector CT imaging of the chest, abdomen and pelvis was performed following the standard protocol without IV contrast. COMPARISON:  Lumbar spine MRI 09/06/2015 and renal ultrasound 09/06/2015 FINDINGS: CT CHEST FINDINGS Mediastinum/Lymph Nodes: No chest wall mass, supraclavicular or axillary lymphadenopathy. Small scattered lymph nodes are noted. The thyroid gland is grossly normal. The heart is normal in size. No pericardial effusion. The aorta is normal in caliber. Mild tortuosity and scattered atherosclerotic calcifications. There are extensive 3 vessel coronary artery calcifications. Moderate distal esophageal wall thickening. Could not exclude neoplasm or esophagitis. No paraesophageal lymphadenopathy. Lungs/Pleura: Limited examination due to breathing motion artifact. There is a small left pleural effusion and areas of atelectasis. Rounded pleural density noted posteriorly at the right lung apex measures -45 Hounsfield units and is probably a small benign pleural lipoma. No worrisome pulmonary lesions to suggest a primary lung neoplasm or metastatic disease. Musculoskeletal: No significant bony findings. CT ABDOMEN PELVIS FINDINGS Hepatobiliary: No obvious hepatic lesions without contrast. Mild hepatomegaly. The gallbladder is distended and there are small gallstones noted. Pancreas: No pancreatic mass, inflammation or  ductal dilatation. Spleen: Upper limits of normal.  No focal lesions. Adrenals/Urinary Tract: Bilateral adrenal gland masses. The right measures a maximum of 37.5 mm and the left 31 mm. No obvious renal mass. There is left-sided hydronephrosis due to a large left-sided retroperitoneal mass which is surrounding the left ureter and lower pole region of the left kidney. I do not see a fat plane between this mass and the aorta. It extends down into the upper left pelvis. This could reflect aggressive lymphoma or retroperitoneal sarcoma. The mass measures approximately 16.4 x 9.7 x 8.7 cm. Recommend image guided biopsy. PET-CT may also be helpful for further evaluation and accurate staging. Stomach/Bowel: The stomach, duodenum, small bowel and colon are grossly normal. No inflammatory changes, mass lesions or obstructive findings. The terminal ileum is normal. Vascular/Lymphatic: Large left-sided retroperitoneal mass as discussed above. Moderate atherosclerotic calcifications involving the aorta but no aneurysm. Reproductive: The bladder, prostate gland and seminal vesicles are unremarkable. Other: 3.6 x 2.1 cm internal iliac nodal mass on the left side. Small amount of free the pelvic fluid and presacral edema. Bilateral inguinal hernias containing fat and fluid and small bowel on the right side. Suspect undescended testicle in the left inguinal canal. This would raise the possibility of testicular cancer. No inguinal adenopathy. Musculoskeletal: No significant bony findings. IMPRESSION: 1. Bulky left-sided retroperitoneal mass which is obstructing the left ureter and causing left-sided hydronephrosis. There are also bilateral adrenal gland masses. Findings could be due to aggressive lymphoma, retroperitoneal sarcoma or possibly testicular carcinoma given the fact that it appears the patients left testicle is in the left inguinal canal. No obvious testicular mass. Scrotal ultrasound may be helpful for further  evaluation. Image guided Biopsy is recommended of the left retroperitoneal mass. 2. No significant findings in the chest. No evidence of a primary lung neoplasm or metastatic pulmonary disease. No mediastinal or hilar mass or adenopathy. 3. Extensive 3 vessel coronary artery calcifications. 4. Small  left pleural effusion. 5. Distended gallbladder and cholelithiasis. 6. Bilateral inguinal hernias containing fat, fluid and small bowel on the right side. Electronically Signed   By: Marijo Sanes M.D.   On: 09/07/2015 13:35   Dg Chest 2 View  09/05/2015  CLINICAL DATA:  64 year old with acute onset of left lower extremity paresis/numbness which began earlier today. Fever. Current history of hypertension and diabetes. Prior stroke and MI. EXAM: CHEST  2 VIEW COMPARISON:  07/26/2004, 07/20/2004. FINDINGS: AP semi-erect and lateral images were obtained. Chronic elevation of the right hemidiaphragm with chronic scar/atelectasis involving the right lower lobe and right middle lobe, unchanged. Lungs otherwise clear. No localized airspace consolidation. No pleural effusions. No pneumothorax. Normal pulmonary vascularity. Cardiac silhouette upper normal in size for AP technique. Thoracic aorta mildly atherosclerotic and tortuous, unchanged. Hilar and mediastinal contours otherwise unremarkable. Visualized bony thorax intact with only mild degenerative changes in the mid thoracic region. IMPRESSION: 1.  No acute cardiopulmonary disease. 2. Stable chronic elevation of the right hemidiaphragm and chronic scar/atelectasis involving the right lower lobe and right middle lobe. Electronically Signed   By: Evangeline Dakin M.D.   On: 09/05/2015 15:26   Ct Head Wo Contrast  09/05/2015  CLINICAL DATA:  Left-sided weakness from previous CVA, weakness has increased over the last day with difficulty walking EXAM: CT HEAD WITHOUT CONTRAST TECHNIQUE: Contiguous axial images were obtained from the base of the skull through the vertex  without intravenous contrast. COMPARISON:  12/21/2011 FINDINGS: The bony calvarium is intact. Atrophic changes are noted. No findings to suggest acute hemorrhage, acute infarction or space-occupying mass lesion are noted. IMPRESSION: Atrophic changes without acute abnormality. Electronically Signed   By: Inez Catalina M.D.   On: 09/05/2015 16:37   Ct Chest Wo Contrast  09/07/2015  CLINICAL DATA:  Evaluate retroperitoneal mass seen on recent MRI of the lumbar spine. EXAM: CT CHEST, ABDOMEN AND PELVIS WITHOUT CONTRAST TECHNIQUE: Multidetector CT imaging of the chest, abdomen and pelvis was performed following the standard protocol without IV contrast. COMPARISON:  Lumbar spine MRI 09/06/2015 and renal ultrasound 09/06/2015 FINDINGS: CT CHEST FINDINGS Mediastinum/Lymph Nodes: No chest wall mass, supraclavicular or axillary lymphadenopathy. Small scattered lymph nodes are noted. The thyroid gland is grossly normal. The heart is normal in size. No pericardial effusion. The aorta is normal in caliber. Mild tortuosity and scattered atherosclerotic calcifications. There are extensive 3 vessel coronary artery calcifications. Moderate distal esophageal wall thickening. Could not exclude neoplasm or esophagitis. No paraesophageal lymphadenopathy. Lungs/Pleura: Limited examination due to breathing motion artifact. There is a small left pleural effusion and areas of atelectasis. Rounded pleural density noted posteriorly at the right lung apex measures -45 Hounsfield units and is probably a small benign pleural lipoma. No worrisome pulmonary lesions to suggest a primary lung neoplasm or metastatic disease. Musculoskeletal: No significant bony findings. CT ABDOMEN PELVIS FINDINGS Hepatobiliary: No obvious hepatic lesions without contrast. Mild hepatomegaly. The gallbladder is distended and there are small gallstones noted. Pancreas: No pancreatic mass, inflammation or ductal dilatation. Spleen: Upper limits of normal.  No focal  lesions. Adrenals/Urinary Tract: Bilateral adrenal gland masses. The right measures a maximum of 37.5 mm and the left 31 mm. No obvious renal mass. There is left-sided hydronephrosis due to a large left-sided retroperitoneal mass which is surrounding the left ureter and lower pole region of the left kidney. I do not see a fat plane between this mass and the aorta. It extends down into the upper left pelvis. This could reflect aggressive  lymphoma or retroperitoneal sarcoma. The mass measures approximately 16.4 x 9.7 x 8.7 cm. Recommend image guided biopsy. PET-CT may also be helpful for further evaluation and accurate staging. Stomach/Bowel: The stomach, duodenum, small bowel and colon are grossly normal. No inflammatory changes, mass lesions or obstructive findings. The terminal ileum is normal. Vascular/Lymphatic: Large left-sided retroperitoneal mass as discussed above. Moderate atherosclerotic calcifications involving the aorta but no aneurysm. Reproductive: The bladder, prostate gland and seminal vesicles are unremarkable. Other: 3.6 x 2.1 cm internal iliac nodal mass on the left side. Small amount of free the pelvic fluid and presacral edema. Bilateral inguinal hernias containing fat and fluid and small bowel on the right side. Suspect undescended testicle in the left inguinal canal. This would raise the possibility of testicular cancer. No inguinal adenopathy. Musculoskeletal: No significant bony findings. IMPRESSION: 1. Bulky left-sided retroperitoneal mass which is obstructing the left ureter and causing left-sided hydronephrosis. There are also bilateral adrenal gland masses. Findings could be due to aggressive lymphoma, retroperitoneal sarcoma or possibly testicular carcinoma given the fact that it appears the patients left testicle is in the left inguinal canal. No obvious testicular mass. Scrotal ultrasound may be helpful for further evaluation. Image guided Biopsy is recommended of the left  retroperitoneal mass. 2. No significant findings in the chest. No evidence of a primary lung neoplasm or metastatic pulmonary disease. No mediastinal or hilar mass or adenopathy. 3. Extensive 3 vessel coronary artery calcifications. 4. Small left pleural effusion. 5. Distended gallbladder and cholelithiasis. 6. Bilateral inguinal hernias containing fat, fluid and small bowel on the right side. Electronically Signed   By: Marijo Sanes M.D.   On: 09/07/2015 13:35   Mr Brain Wo Contrast  09/06/2015  CLINICAL DATA:  LEFT lower extremity numbness and LEFT-sided weakness for 2 days. Night cough for 2-3 weeks. History of stroke and LEFT-sided weakness, diabetes, hypertension, coronary artery disease. EXAM: MRI HEAD WITHOUT CONTRAST TECHNIQUE: Multiplanar, multiecho pulse sequences of the brain and surrounding structures were obtained without intravenous contrast. COMPARISON:  CT head September 05, 2015 at 1626 hours FINDINGS: Multiple sequences are mild or moderately motion degraded. No reduced diffusion to suggest acute ischemia. No susceptibility artifact to suggest hemorrhage. Ventricles and sulci are normal for patient's age. Old RIGHT ventral pons infarct. Old RIGHT basal ganglia lacunar infarct. Mild RIGHT cerebral peduncle volume loss compatible with wallerian degeneration. Mild white matter changes compatible with chronic small vessel ischemic disease, decreased sensitivity due to patient motion. No abnormal extra-axial fluid collections. Dolicoectatic intracranial vessel flow voids seen at the skull base. Ocular globes and orbital contents are grossly normal though motion degraded. Paranasal sinuses and mastoid air cells are well aerated. No abnormal sellar expansion. No is abnormal cerebellar tonsillar descent below the foramen magnum. No suspicious bone marrow signal. IMPRESSION: No acute intracranial process on this motion degraded examination. Involutional changes and mild chronic small vessel ischemic  disease. Old RIGHT pontine infarct. Old RIGHT basal ganglia lacunar infarct. Electronically Signed   By: Elon Alas M.D.   On: 09/06/2015 02:55   Mr Lumbar Spine Wo Contrast  09/06/2015  CLINICAL DATA:  LEFT lower extremity numbness and LEFT-sided weakness for 2 days. History of stroke in LEFT-sided weakness, diabetes, hypertension, coronary artery disease. EXAM: MRI LUMBAR SPINE WITHOUT CONTRAST TECHNIQUE: Multiplanar, multisequence MR imaging of the lumbar spine was performed. No intravenous contrast was administered. COMPARISON:  None. FINDINGS: Lumbar vertebral bodies and posterior elements are intact and aligned with maintenance of lumbar lordosis. Intervertebral discs demonstrate normal  morphology, with slight decreased T2 signal within the lower lumbar disc consistent with mild desiccation. Minimal subacute discogenic endplate changes at all lumbar levels, no STIR signal abnormality to suggest acute osseous process. Conus medullaris terminates at L1 and appears normal in morphology and signal characteristics. Limited assessment of cauda equina due to patient motion. LEFT abdominal mass partially imaged, with mass effect on the LEFT psoas muscle. Probable lymphadenopathy along LEFT Common iliac chain. Probable obstructive uropathy on the LEFT. Partially imaged sub cm gallstone. Level by level evaluation: L1-2 and L2-3: No significant disc bulge, canal stenosis or neural foraminal narrowing. L3-4: Small broad-based disc bulge, mild facet and ligamentum flavum redundancy without canal stenosis. Minimal bilateral neural foraminal narrowing. L4-5: Small broad-based disc bulge asymmetric to LEFT. Mild facet arthropathy and ligamentum flavum redundancy without canal stenosis. Minimal LEFT neural foraminal narrowing. L5-S1: No disc bulge, canal stenosis nor neural foraminal narrowing. IMPRESSION: No acute lumbar spine fracture nor malalignment. No canal stenosis. Minimal L3-4 and L4-5 neural foraminal  narrowing. Large LEFT retroperitoneal mass with mass effect on the LEFT psoas muscle and probable lymphadenopathy along LEFT iliac chain. Recommend CT of the abdomen and pelvis with contrast. Mass appears to result in LEFT obstructive uropathy. Electronically Signed   By: Elon Alas M.D.   On: 09/06/2015 23:18   US Scrotum  09/07/2015  CLINICAL DATA:  64 year old male with palpable testicular mass. EXAM: ULTRASOUND OF SCROTUM TECHNIQUE: Complete ultrasound examination of the testicles, epididymis, and other scrotal structures was performed. COMPARISON:  None. FINDINGS: Right testicle Measurements: 4.5 x 2.6 x 2 cm. No mass or microlithiasis visualized. Left testicle Measurements: 4.4 x 3.4 x 2.9 cm. A 2.2 x 3.3 x 2.3 cm oval hypoechoic area within the left testicle is noted with some vascular flow. No other abnormalities identified. Right epididymis:  Not visualized Left epididymis:  Normal in size and appearance. Hydrocele:  Small - moderate bilateral hydroceles noted. Varicocele:  Mild left varicocele noted. IMPRESSION: 2.2 x 3.3 x 2.3 cm oval hypoechoic area within the left testicle suspicious for testicular mass/neoplasm. Urology consultation is recommended. Unremarkable right testicle. Small to moderate bilateral hydroceles. Electronically Signed   By: Margarette Canada M.D.   On: 09/07/2015 20:02   US Renal  09/06/2015  CLINICAL DATA:  Acute renal failure. EXAM: RENAL / URINARY TRACT ULTRASOUND COMPLETE COMPARISON:  None. FINDINGS: Right Kidney: Length: 9.6 cm. Heterogeneous increased parenchymal echotexture with parenchymal thinning suggesting chronic medical renal disease. No hydronephrosis or solid mass. Left Kidney: Length: 9.8 cm. Heterogeneous increased parenchymal echotexture likely indicating chronic medical renal disease. Appears to be circumscribed hypoechoic appearance of the lower pole of the left kidney. This may indicate edema versus abscess. No hydronephrosis or solid mass identified.  Bladder: No bladder wall thickening or filling defect. Bilateral urine flow jets are demonstrated on color flow Doppler imaging. Incidental note of mildly enlarged prostate gland measuring 3.6 x 3.6 x 3.3 cm. IMPRESSION: Heterogeneous parenchymal echotexture bilaterally suggesting chronic medical renal disease. Hypoechoic appearance of the lower pole left kidney could indicate edema, pyelonephritis, or abscess. No hydronephrosis in either kidney. Mild prostate enlargement. Electronically Signed   By: Lucienne Capers M.D.   On: 09/06/2015 03:19   Portable Chest 1 View  09/06/2015  CLINICAL DATA:  Fever. EXAM: PORTABLE CHEST 1 VIEW COMPARISON:  09/05/2015.  07/20/2004. FINDINGS: Mediastinum and hilar structures are normal. Heart size stable. Persistent right lower lobe subsegmental atelectasis and/or scarring. No pleural effusion pneumothorax. IMPRESSION: Stable right base subsegmental atelectasis and/or scarring.  No acute cardiopulmonary disease. Electronically Signed   By: Marcello Moores  Register   On: 09/06/2015 07:19     CBC  Recent Labs Lab 09/05/15 1500 09/06/15 0242 09/07/15 0917 09/08/15 0404  WBC 5.9 4.2 4.3 3.8*  HGB 11.2* 9.8* 9.3* 9.2*  HCT 34.3* 30.7* 28.6* 28.1*  PLT 267 205 180 189  MCV 75.9* 77.3* 77.3* 77.0*  MCH 24.8* 24.7* 25.1* 25.2*  MCHC 32.7 31.9 32.5 32.7  RDW 16.1* 16.2* 16.5* 16.6*  LYMPHSABS 0.7  --  0.9  --   MONOABS 1.6*  --  0.8  --   EOSABS 0.1  --  0.2  --   BASOSABS 0.0  --  0.0  --     Chemistries   Recent Labs Lab 09/05/15 1500 09/05/15 1837 09/05/15 2043 09/06/15 0242 09/07/15 0917 09/08/15 0404  NA 129*  --  132* 135 135 133*  K 5.9*  --  5.0 5.0 5.2* 4.3  CL 93*  --  98* 100* 105 98*  CO2 25  --  23 20* 21* 22  GLUCOSE 150*  --  117* 116* 103* 225*  BUN 37*  --  32* 31* 24* 30*  CREATININE 3.44*  --  3.24* 3.14* 3.01* 3.19*  CALCIUM 9.2  --  8.2* 8.4* 8.2* 7.8*  MG  --  1.6* 1.8 2.3  --   --   AST 50*  --   --  34 41 35  ALT 21  --   --   19 20 22   ALKPHOS 79  --   --  61 64 63  BILITOT 1.3*  --   --  1.0 1.0 0.8   ------------------------------------------------------------------------------------------------------------------ estimated creatinine clearance is 22.1 mL/min (by C-G formula based on Cr of 3.19). ------------------------------------------------------------------------------------------------------------------  Recent Labs  09/05/15 2043  HGBA1C 6.2*   ------------------------------------------------------------------------------------------------------------------ No results for input(s): CHOL, HDL, LDLCALC, TRIG, CHOLHDL, LDLDIRECT in the last 72 hours. ------------------------------------------------------------------------------------------------------------------  Recent Labs  09/05/15 2043  TSH 2.894   ------------------------------------------------------------------------------------------------------------------  Recent Labs  09/07/15 1840  FERRITIN 766*    Coagulation profile  Recent Labs Lab 09/05/15 1500  INR 1.26    No results for input(s): DDIMER in the last 72 hours.  Cardiac Enzymes  Recent Labs Lab 09/05/15 1837  TROPONINI <0.03   ------------------------------------------------------------------------------------------------------------------ Invalid input(s): POCBNP   CBG:  Recent Labs Lab 09/07/15 1116 09/07/15 1709 09/07/15 2057 09/08/15 0618 09/08/15 1121  GLUCAP 113* 176* 276* 196* 171*       Studies: Ct Abdomen Pelvis Wo Contrast  09/07/2015  CLINICAL DATA:  Evaluate retroperitoneal mass seen on recent MRI of the lumbar spine. EXAM: CT CHEST, ABDOMEN AND PELVIS WITHOUT CONTRAST TECHNIQUE: Multidetector CT imaging of the chest, abdomen and pelvis was performed following the standard protocol without IV contrast. COMPARISON:  Lumbar spine MRI 09/06/2015 and renal ultrasound 09/06/2015 FINDINGS: CT CHEST FINDINGS Mediastinum/Lymph Nodes: No chest  wall mass, supraclavicular or axillary lymphadenopathy. Small scattered lymph nodes are noted. The thyroid gland is grossly normal. The heart is normal in size. No pericardial effusion. The aorta is normal in caliber. Mild tortuosity and scattered atherosclerotic calcifications. There are extensive 3 vessel coronary artery calcifications. Moderate distal esophageal wall thickening. Could not exclude neoplasm or esophagitis. No paraesophageal lymphadenopathy. Lungs/Pleura: Limited examination due to breathing motion artifact. There is a small left pleural effusion and areas of atelectasis. Rounded pleural density noted posteriorly at the right lung apex measures -45 Hounsfield units and is probably a small benign pleural lipoma. No  worrisome pulmonary lesions to suggest a primary lung neoplasm or metastatic disease. Musculoskeletal: No significant bony findings. CT ABDOMEN PELVIS FINDINGS Hepatobiliary: No obvious hepatic lesions without contrast. Mild hepatomegaly. The gallbladder is distended and there are small gallstones noted. Pancreas: No pancreatic mass, inflammation or ductal dilatation. Spleen: Upper limits of normal.  No focal lesions. Adrenals/Urinary Tract: Bilateral adrenal gland masses. The right measures a maximum of 37.5 mm and the left 31 mm. No obvious renal mass. There is left-sided hydronephrosis due to a large left-sided retroperitoneal mass which is surrounding the left ureter and lower pole region of the left kidney. I do not see a fat plane between this mass and the aorta. It extends down into the upper left pelvis. This could reflect aggressive lymphoma or retroperitoneal sarcoma. The mass measures approximately 16.4 x 9.7 x 8.7 cm. Recommend image guided biopsy. PET-CT may also be helpful for further evaluation and accurate staging. Stomach/Bowel: The stomach, duodenum, small bowel and colon are grossly normal. No inflammatory changes, mass lesions or obstructive findings. The terminal ileum  is normal. Vascular/Lymphatic: Large left-sided retroperitoneal mass as discussed above. Moderate atherosclerotic calcifications involving the aorta but no aneurysm. Reproductive: The bladder, prostate gland and seminal vesicles are unremarkable. Other: 3.6 x 2.1 cm internal iliac nodal mass on the left side. Small amount of free the pelvic fluid and presacral edema. Bilateral inguinal hernias containing fat and fluid and small bowel on the right side. Suspect undescended testicle in the left inguinal canal. This would raise the possibility of testicular cancer. No inguinal adenopathy. Musculoskeletal: No significant bony findings. IMPRESSION: 1. Bulky left-sided retroperitoneal mass which is obstructing the left ureter and causing left-sided hydronephrosis. There are also bilateral adrenal gland masses. Findings could be due to aggressive lymphoma, retroperitoneal sarcoma or possibly testicular carcinoma given the fact that it appears the patients left testicle is in the left inguinal canal. No obvious testicular mass. Scrotal ultrasound may be helpful for further evaluation. Image guided Biopsy is recommended of the left retroperitoneal mass. 2. No significant findings in the chest. No evidence of a primary lung neoplasm or metastatic pulmonary disease. No mediastinal or hilar mass or adenopathy. 3. Extensive 3 vessel coronary artery calcifications. 4. Small left pleural effusion. 5. Distended gallbladder and cholelithiasis. 6. Bilateral inguinal hernias containing fat, fluid and small bowel on the right side. Electronically Signed   By: Marijo Sanes M.D.   On: 09/07/2015 13:35   Ct Chest Wo Contrast  09/07/2015  CLINICAL DATA:  Evaluate retroperitoneal mass seen on recent MRI of the lumbar spine. EXAM: CT CHEST, ABDOMEN AND PELVIS WITHOUT CONTRAST TECHNIQUE: Multidetector CT imaging of the chest, abdomen and pelvis was performed following the standard protocol without IV contrast. COMPARISON:  Lumbar spine  MRI 09/06/2015 and renal ultrasound 09/06/2015 FINDINGS: CT CHEST FINDINGS Mediastinum/Lymph Nodes: No chest wall mass, supraclavicular or axillary lymphadenopathy. Small scattered lymph nodes are noted. The thyroid gland is grossly normal. The heart is normal in size. No pericardial effusion. The aorta is normal in caliber. Mild tortuosity and scattered atherosclerotic calcifications. There are extensive 3 vessel coronary artery calcifications. Moderate distal esophageal wall thickening. Could not exclude neoplasm or esophagitis. No paraesophageal lymphadenopathy. Lungs/Pleura: Limited examination due to breathing motion artifact. There is a small left pleural effusion and areas of atelectasis. Rounded pleural density noted posteriorly at the right lung apex measures -45 Hounsfield units and is probably a small benign pleural lipoma. No worrisome pulmonary lesions to suggest a primary lung neoplasm or metastatic disease.  Musculoskeletal: No significant bony findings. CT ABDOMEN PELVIS FINDINGS Hepatobiliary: No obvious hepatic lesions without contrast. Mild hepatomegaly. The gallbladder is distended and there are small gallstones noted. Pancreas: No pancreatic mass, inflammation or ductal dilatation. Spleen: Upper limits of normal.  No focal lesions. Adrenals/Urinary Tract: Bilateral adrenal gland masses. The right measures a maximum of 37.5 mm and the left 31 mm. No obvious renal mass. There is left-sided hydronephrosis due to a large left-sided retroperitoneal mass which is surrounding the left ureter and lower pole region of the left kidney. I do not see a fat plane between this mass and the aorta. It extends down into the upper left pelvis. This could reflect aggressive lymphoma or retroperitoneal sarcoma. The mass measures approximately 16.4 x 9.7 x 8.7 cm. Recommend image guided biopsy. PET-CT may also be helpful for further evaluation and accurate staging. Stomach/Bowel: The stomach, duodenum, small bowel  and colon are grossly normal. No inflammatory changes, mass lesions or obstructive findings. The terminal ileum is normal. Vascular/Lymphatic: Large left-sided retroperitoneal mass as discussed above. Moderate atherosclerotic calcifications involving the aorta but no aneurysm. Reproductive: The bladder, prostate gland and seminal vesicles are unremarkable. Other: 3.6 x 2.1 cm internal iliac nodal mass on the left side. Small amount of free the pelvic fluid and presacral edema. Bilateral inguinal hernias containing fat and fluid and small bowel on the right side. Suspect undescended testicle in the left inguinal canal. This would raise the possibility of testicular cancer. No inguinal adenopathy. Musculoskeletal: No significant bony findings. IMPRESSION: 1. Bulky left-sided retroperitoneal mass which is obstructing the left ureter and causing left-sided hydronephrosis. There are also bilateral adrenal gland masses. Findings could be due to aggressive lymphoma, retroperitoneal sarcoma or possibly testicular carcinoma given the fact that it appears the patients left testicle is in the left inguinal canal. No obvious testicular mass. Scrotal ultrasound may be helpful for further evaluation. Image guided Biopsy is recommended of the left retroperitoneal mass. 2. No significant findings in the chest. No evidence of a primary lung neoplasm or metastatic pulmonary disease. No mediastinal or hilar mass or adenopathy. 3. Extensive 3 vessel coronary artery calcifications. 4. Small left pleural effusion. 5. Distended gallbladder and cholelithiasis. 6. Bilateral inguinal hernias containing fat, fluid and small bowel on the right side. Electronically Signed   By: Marijo Sanes M.D.   On: 09/07/2015 13:35   Mr Lumbar Spine Wo Contrast  09/06/2015  CLINICAL DATA:  LEFT lower extremity numbness and LEFT-sided weakness for 2 days. History of stroke in LEFT-sided weakness, diabetes, hypertension, coronary artery disease. EXAM: MRI  LUMBAR SPINE WITHOUT CONTRAST TECHNIQUE: Multiplanar, multisequence MR imaging of the lumbar spine was performed. No intravenous contrast was administered. COMPARISON:  None. FINDINGS: Lumbar vertebral bodies and posterior elements are intact and aligned with maintenance of lumbar lordosis. Intervertebral discs demonstrate normal morphology, with slight decreased T2 signal within the lower lumbar disc consistent with mild desiccation. Minimal subacute discogenic endplate changes at all lumbar levels, no STIR signal abnormality to suggest acute osseous process. Conus medullaris terminates at L1 and appears normal in morphology and signal characteristics. Limited assessment of cauda equina due to patient motion. LEFT abdominal mass partially imaged, with mass effect on the LEFT psoas muscle. Probable lymphadenopathy along LEFT Common iliac chain. Probable obstructive uropathy on the LEFT. Partially imaged sub cm gallstone. Level by level evaluation: L1-2 and L2-3: No significant disc bulge, canal stenosis or neural foraminal narrowing. L3-4: Small broad-based disc bulge, mild facet and ligamentum flavum redundancy without canal  stenosis. Minimal bilateral neural foraminal narrowing. L4-5: Small broad-based disc bulge asymmetric to LEFT. Mild facet arthropathy and ligamentum flavum redundancy without canal stenosis. Minimal LEFT neural foraminal narrowing. L5-S1: No disc bulge, canal stenosis nor neural foraminal narrowing. IMPRESSION: No acute lumbar spine fracture nor malalignment. No canal stenosis. Minimal L3-4 and L4-5 neural foraminal narrowing. Large LEFT retroperitoneal mass with mass effect on the LEFT psoas muscle and probable lymphadenopathy along LEFT iliac chain. Recommend CT of the abdomen and pelvis with contrast. Mass appears to result in LEFT obstructive uropathy. Electronically Signed   By: Elon Alas M.D.   On: 09/06/2015 23:18   US Scrotum  09/07/2015  CLINICAL DATA:  64 year old male  with palpable testicular mass. EXAM: ULTRASOUND OF SCROTUM TECHNIQUE: Complete ultrasound examination of the testicles, epididymis, and other scrotal structures was performed. COMPARISON:  None. FINDINGS: Right testicle Measurements: 4.5 x 2.6 x 2 cm. No mass or microlithiasis visualized. Left testicle Measurements: 4.4 x 3.4 x 2.9 cm. A 2.2 x 3.3 x 2.3 cm oval hypoechoic area within the left testicle is noted with some vascular flow. No other abnormalities identified. Right epididymis:  Not visualized Left epididymis:  Normal in size and appearance. Hydrocele:  Small - moderate bilateral hydroceles noted. Varicocele:  Mild left varicocele noted. IMPRESSION: 2.2 x 3.3 x 2.3 cm oval hypoechoic area within the left testicle suspicious for testicular mass/neoplasm. Urology consultation is recommended. Unremarkable right testicle. Small to moderate bilateral hydroceles. Electronically Signed   By: Margarette Canada M.D.   On: 09/07/2015 20:02      Lab Results  Component Value Date   HGBA1C 6.2* 09/05/2015   HGBA1C 13.2* 12/22/2011   Lab Results  Component Value Date   CREATININE 3.19* 09/08/2015       Scheduled Meds: . aspirin  81 mg Oral Daily  . feeding supplement (GLUCERNA SHAKE)  237 mL Oral TID BM  . fentaNYL      . fluconazole (DIFLUCAN) IV  100 mg Intravenous Q24H  . guaiFENesin  1,200 mg Oral BID  . heparin  5,000 Units Subcutaneous 3 times per day  . insulin aspart  0-9 Units Subcutaneous TID WC  . lidocaine      . midazolam      . pantoprazole (PROTONIX) IV  40 mg Intravenous Q24H  . piperacillin-tazobactam (ZOSYN)  IV  3.375 g Intravenous 3 times per day  . sodium chloride  3 mL Intravenous Q12H  . vancomycin  750 mg Intravenous Q24H   Continuous Infusions:    Principal Problem:   SIRS (systemic inflammatory response syndrome) (HCC) Active Problems:   Diabetes mellitus (HCC)   Weakness   Hypotension   Hyperkalemia   ARF (acute renal failure) (HCC)   Dehydration   Left  sided numbness   H/O: CVA (cerebrovascular accident)   CAD (coronary artery disease)   Oral thrush   Dysphagia   Anemia   Hyponatremia   Weakness of left side of body   Pelvic mass in male    Time spent: 67 minutes   [facility] Pager [phone]. If 7PM-7AM, please contact night-coverage at [email or web address], password [email or web address] 09/08/2015, 2:27 PM  LOS: 3 days

## 2015-09-08 NOTE — Progress Notes (Signed)
IP PROGRESS NOTE  Subjective:   He has no complaint other than persistent left-sided weakness. He appears comfortable.  Objective: Vital signs in last 24 hours: Blood pressure 103/73, pulse 83, temperature 97.6 F (36.4 C), temperature source Oral, resp. rate 18, height 5\' 5"  (1.651 m), weight 169 lb 12.1 oz (77 kg), SpO2 95 %.  Intake/Output from previous day: 10/13 0701 - 10/14 0700 In: 12250 [P.O.:12250] Out: 3054 [Urine:3050; Stool:4]  Physical Exam:  Lungs: Lungs clear anteriorly Cardiac: Regular rate and rhythm Abdomen: No mass, no hepatosplenomegaly Extremities: No leg edema Neurologic: 4/5 strength in the left arm and hand, 3/5 strength at the left leg and foot  Lab Results:  Recent Labs  09/07/15 0917 09/08/15 0404  WBC 4.3 3.8*  HGB 9.3* 9.2*  HCT 28.6* 28.1*  PLT 180 189    BMET  Recent Labs  09/07/15 0917 09/08/15 0404  NA 135 133*  K 5.2* 4.3  CL 105 98*  CO2 21* 22  GLUCOSE 103* 225*  BUN 24* 30*  CREATININE 3.01* 3.19*  CALCIUM 8.2* 7.8*   AFP-1.0, beta hCG-1  Studies/Results: Ct Abdomen Pelvis Wo Contrast  09/07/2015  CLINICAL DATA:  Evaluate retroperitoneal mass seen on recent MRI of the lumbar spine. EXAM: CT CHEST, ABDOMEN AND PELVIS WITHOUT CONTRAST TECHNIQUE: Multidetector CT imaging of the chest, abdomen and pelvis was performed following the standard protocol without IV contrast. COMPARISON:  Lumbar spine MRI 09/06/2015 and renal ultrasound 09/06/2015 FINDINGS: CT CHEST FINDINGS Mediastinum/Lymph Nodes: No chest wall mass, supraclavicular or axillary lymphadenopathy. Small scattered lymph nodes are noted. The thyroid gland is grossly normal. The heart is normal in size. No pericardial effusion. The aorta is normal in caliber. Mild tortuosity and scattered atherosclerotic calcifications. There are extensive 3 vessel coronary artery calcifications. Moderate distal esophageal wall thickening. Could not exclude neoplasm or esophagitis. No  paraesophageal lymphadenopathy. Lungs/Pleura: Limited examination due to breathing motion artifact. There is a small left pleural effusion and areas of atelectasis. Rounded pleural density noted posteriorly at the right lung apex measures -45 Hounsfield units and is probably a small benign pleural lipoma. No worrisome pulmonary lesions to suggest a primary lung neoplasm or metastatic disease. Musculoskeletal: No significant bony findings. CT ABDOMEN PELVIS FINDINGS Hepatobiliary: No obvious hepatic lesions without contrast. Mild hepatomegaly. The gallbladder is distended and there are small gallstones noted. Pancreas: No pancreatic mass, inflammation or ductal dilatation. Spleen: Upper limits of normal.  No focal lesions. Adrenals/Urinary Tract: Bilateral adrenal gland masses. The right measures a maximum of 37.5 mm and the left 31 mm. No obvious renal mass. There is left-sided hydronephrosis due to a large left-sided retroperitoneal mass which is surrounding the left ureter and lower pole region of the left kidney. I do not see a fat plane between this mass and the aorta. It extends down into the upper left pelvis. This could reflect aggressive lymphoma or retroperitoneal sarcoma. The mass measures approximately 16.4 x 9.7 x 8.7 cm. Recommend image guided biopsy. PET-CT may also be helpful for further evaluation and accurate staging. Stomach/Bowel: The stomach, duodenum, small bowel and colon are grossly normal. No inflammatory changes, mass lesions or obstructive findings. The terminal ileum is normal. Vascular/Lymphatic: Large left-sided retroperitoneal mass as discussed above. Moderate atherosclerotic calcifications involving the aorta but no aneurysm. Reproductive: The bladder, prostate gland and seminal vesicles are unremarkable. Other: 3.6 x 2.1 cm internal iliac nodal mass on the left side. Small amount of free the pelvic fluid and presacral edema. Bilateral inguinal hernias containing  fat and fluid and  small bowel on the right side. Suspect undescended testicle in the left inguinal canal. This would raise the possibility of testicular cancer. No inguinal adenopathy. Musculoskeletal: No significant bony findings. IMPRESSION: 1. Bulky left-sided retroperitoneal mass which is obstructing the left ureter and causing left-sided hydronephrosis. There are also bilateral adrenal gland masses. Findings could be due to aggressive lymphoma, retroperitoneal sarcoma or possibly testicular carcinoma given the fact that it appears the patients left testicle is in the left inguinal canal. No obvious testicular mass. Scrotal ultrasound may be helpful for further evaluation. Image guided Biopsy is recommended of the left retroperitoneal mass. 2. No significant findings in the chest. No evidence of a primary lung neoplasm or metastatic pulmonary disease. No mediastinal or hilar mass or adenopathy. 3. Extensive 3 vessel coronary artery calcifications. 4. Small left pleural effusion. 5. Distended gallbladder and cholelithiasis. 6. Bilateral inguinal hernias containing fat, fluid and small bowel on the right side. Electronically Signed   By: Marijo Sanes M.D.   On: 09/07/2015 13:35   Ct Chest Wo Contrast  09/07/2015  CLINICAL DATA:  Evaluate retroperitoneal mass seen on recent MRI of the lumbar spine. EXAM: CT CHEST, ABDOMEN AND PELVIS WITHOUT CONTRAST TECHNIQUE: Multidetector CT imaging of the chest, abdomen and pelvis was performed following the standard protocol without IV contrast. COMPARISON:  Lumbar spine MRI 09/06/2015 and renal ultrasound 09/06/2015 FINDINGS: CT CHEST FINDINGS Mediastinum/Lymph Nodes: No chest wall mass, supraclavicular or axillary lymphadenopathy. Small scattered lymph nodes are noted. The thyroid gland is grossly normal. The heart is normal in size. No pericardial effusion. The aorta is normal in caliber. Mild tortuosity and scattered atherosclerotic calcifications. There are extensive 3 vessel coronary  artery calcifications. Moderate distal esophageal wall thickening. Could not exclude neoplasm or esophagitis. No paraesophageal lymphadenopathy. Lungs/Pleura: Limited examination due to breathing motion artifact. There is a small left pleural effusion and areas of atelectasis. Rounded pleural density noted posteriorly at the right lung apex measures -45 Hounsfield units and is probably a small benign pleural lipoma. No worrisome pulmonary lesions to suggest a primary lung neoplasm or metastatic disease. Musculoskeletal: No significant bony findings. CT ABDOMEN PELVIS FINDINGS Hepatobiliary: No obvious hepatic lesions without contrast. Mild hepatomegaly. The gallbladder is distended and there are small gallstones noted. Pancreas: No pancreatic mass, inflammation or ductal dilatation. Spleen: Upper limits of normal.  No focal lesions. Adrenals/Urinary Tract: Bilateral adrenal gland masses. The right measures a maximum of 37.5 mm and the left 31 mm. No obvious renal mass. There is left-sided hydronephrosis due to a large left-sided retroperitoneal mass which is surrounding the left ureter and lower pole region of the left kidney. I do not see a fat plane between this mass and the aorta. It extends down into the upper left pelvis. This could reflect aggressive lymphoma or retroperitoneal sarcoma. The mass measures approximately 16.4 x 9.7 x 8.7 cm. Recommend image guided biopsy. PET-CT may also be helpful for further evaluation and accurate staging. Stomach/Bowel: The stomach, duodenum, small bowel and colon are grossly normal. No inflammatory changes, mass lesions or obstructive findings. The terminal ileum is normal. Vascular/Lymphatic: Large left-sided retroperitoneal mass as discussed above. Moderate atherosclerotic calcifications involving the aorta but no aneurysm. Reproductive: The bladder, prostate gland and seminal vesicles are unremarkable. Other: 3.6 x 2.1 cm internal iliac nodal mass on the left side. Small  amount of free the pelvic fluid and presacral edema. Bilateral inguinal hernias containing fat and fluid and small bowel on the right side. Suspect  undescended testicle in the left inguinal canal. This would raise the possibility of testicular cancer. No inguinal adenopathy. Musculoskeletal: No significant bony findings. IMPRESSION: 1. Bulky left-sided retroperitoneal mass which is obstructing the left ureter and causing left-sided hydronephrosis. There are also bilateral adrenal gland masses. Findings could be due to aggressive lymphoma, retroperitoneal sarcoma or possibly testicular carcinoma given the fact that it appears the patients left testicle is in the left inguinal canal. No obvious testicular mass. Scrotal ultrasound may be helpful for further evaluation. Image guided Biopsy is recommended of the left retroperitoneal mass. 2. No significant findings in the chest. No evidence of a primary lung neoplasm or metastatic pulmonary disease. No mediastinal or hilar mass or adenopathy. 3. Extensive 3 vessel coronary artery calcifications. 4. Small left pleural effusion. 5. Distended gallbladder and cholelithiasis. 6. Bilateral inguinal hernias containing fat, fluid and small bowel on the right side. Electronically Signed   By: Marijo Sanes M.D.   On: 09/07/2015 13:35   Mr Lumbar Spine Wo Contrast  09/06/2015  CLINICAL DATA:  LEFT lower extremity numbness and LEFT-sided weakness for 2 days. History of stroke in LEFT-sided weakness, diabetes, hypertension, coronary artery disease. EXAM: MRI LUMBAR SPINE WITHOUT CONTRAST TECHNIQUE: Multiplanar, multisequence MR imaging of the lumbar spine was performed. No intravenous contrast was administered. COMPARISON:  None. FINDINGS: Lumbar vertebral bodies and posterior elements are intact and aligned with maintenance of lumbar lordosis. Intervertebral discs demonstrate normal morphology, with slight decreased T2 signal within the lower lumbar disc consistent with mild  desiccation. Minimal subacute discogenic endplate changes at all lumbar levels, no STIR signal abnormality to suggest acute osseous process. Conus medullaris terminates at L1 and appears normal in morphology and signal characteristics. Limited assessment of cauda equina due to patient motion. LEFT abdominal mass partially imaged, with mass effect on the LEFT psoas muscle. Probable lymphadenopathy along LEFT Common iliac chain. Probable obstructive uropathy on the LEFT. Partially imaged sub cm gallstone. Level by level evaluation: L1-2 and L2-3: No significant disc bulge, canal stenosis or neural foraminal narrowing. L3-4: Small broad-based disc bulge, mild facet and ligamentum flavum redundancy without canal stenosis. Minimal bilateral neural foraminal narrowing. L4-5: Small broad-based disc bulge asymmetric to LEFT. Mild facet arthropathy and ligamentum flavum redundancy without canal stenosis. Minimal LEFT neural foraminal narrowing. L5-S1: No disc bulge, canal stenosis nor neural foraminal narrowing. IMPRESSION: No acute lumbar spine fracture nor malalignment. No canal stenosis. Minimal L3-4 and L4-5 neural foraminal narrowing. Large LEFT retroperitoneal mass with mass effect on the LEFT psoas muscle and probable lymphadenopathy along LEFT iliac chain. Recommend CT of the abdomen and pelvis with contrast. Mass appears to result in LEFT obstructive uropathy. Electronically Signed   By: Elon Alas M.D.   On: 09/06/2015 23:18   US Scrotum  09/07/2015  CLINICAL DATA:  64 year old male with palpable testicular mass. EXAM: ULTRASOUND OF SCROTUM TECHNIQUE: Complete ultrasound examination of the testicles, epididymis, and other scrotal structures was performed. COMPARISON:  None. FINDINGS: Right testicle Measurements: 4.5 x 2.6 x 2 cm. No mass or microlithiasis visualized. Left testicle Measurements: 4.4 x 3.4 x 2.9 cm. A 2.2 x 3.3 x 2.3 cm oval hypoechoic area within the left testicle is noted with some  vascular flow. No other abnormalities identified. Right epididymis:  Not visualized Left epididymis:  Normal in size and appearance. Hydrocele:  Small - moderate bilateral hydroceles noted. Varicocele:  Mild left varicocele noted. IMPRESSION: 2.2 x 3.3 x 2.3 cm oval hypoechoic area within the left testicle suspicious for testicular  mass/neoplasm. Urology consultation is recommended. Unremarkable right testicle. Small to moderate bilateral hydroceles. Electronically Signed   By: Margarette Canada M.D.   On: 09/07/2015 20:02    Medications: I have reviewed the patient's current medications.  Assessment/Plan:  1. Left retroperitoneal mass, adrenal masses, left testicular mass 2. Renal failure, left hydronephrosis 3. Left hemiplegia-chronic following a CVA with 4. Microcytic anemia 5. Fever  Mr. Burgeson appears stable. He is scheduled to undergo a biopsy of the left retroperitoneal mass today. 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 The fever may be secondary to a urinary tract infection or "tumor "fever.  Recommendations: 1. Proceed with biopsy of the retroperitoneal mass today 2. Urology consult to evaluate the hydronephrosis/renal failure and left testicular mass 3. Please call Oncology over the weekend as needed. I will follow-up on the pathology 09/11/2015.  LOS: 3 days   Kassidee Narciso  09/08/2015, 2:36 PM

## 2015-09-08 NOTE — Consult Note (Signed)
Urology Consult  Referring physician: Dr. Benay Spice Reason for referral: Retroperitoneal mass, left testicular mass, left hydronephrosis  History of Present Illness: Carlos Black is a 64 year old male with multiple medical issues who was admitted several days ago with left-sided weakness and is now been diagnosed with a large retroperitoneal mass. He receives his primary care through Dr. Lavone Orn and is seen regularly primarily for diabetes mellitus. Recently he has experienced episodes of lower extremity weakness. Recently he was unable to stand after having a bowel movement and was brought to the [facility]. He has had an extensive evaluation with a multitude of imaging studies. These have been reviewed and appropriate images reviewed. Initially urology was contacted yesterday with concerns over hydronephrosis. The urologist on call reviewed an ultrasound which revealed no hydronephrosis and did not feel there was any need for urologic assessment. In the interim some additional information and imaging has come to light. In addition to a very large retroperitoneal mass the patient has undergone clinical exam which revealed a left-sided testicular mass confirmed on scrotal ultrasonography. In addition CT imaging does suggest at least some degree of left-sided hydronephrosis probably secondary to extrinsic compression of the ureter. The patient has significant chronic renal insufficiency with a creatinine of approximately 3. Renal ultrasound suggested an echo pattern consistent with medical renal disease. There is no evidence of right-sided hydronephrosis. The patient has subsequently undergone percutaneous biopsy of the retroperitoneal mass this afternoon. Results are pending. He complains of ongoing lower extremity weakness but has no other significant complaints. He has had some mild abdominal bloating but no abdominal flank or back discomfort at this time. He stills me he has had some issues with  fecal incontinence. He is currently wearing a condom catheter for urinary drainage but denies any urinary incontinence. He's had no gross hematuria and denies any real voiding complaints. 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 Beta hCG and alpha-fetoprotein levels were normal.  Past Medical History  Diagnosis Date  . Hypertension   . Coronary artery disease   . Myocardial infarction ([place])   . CAD (coronary artery disease) 09/05/2015  . SIRS (systemic inflammatory response syndrome) ([place]) 09/06/2015  . Acute renal failure (ARF) ([place]) 09/06/2015  . Type II diabetes mellitus ([place])   . Stroke [facility]) 2004    w/left sided weakness/notes 09/05/2015   Past Surgical History  Procedure Laterality Date  . Basilar arterty stent  07/25/2004     Dr. Abel Presto Deveshwar/notes 02/01/2011    Medications:  Scheduled: . aspirin  81 mg Oral Daily  . dexamethasone  4 mg Intravenous 4 times per day  . feeding supplement (GLUCERNA SHAKE)  237 mL Oral TID BM  . fentaNYL      . fluconazole (DIFLUCAN) IV  100 mg Intravenous Q24H  . guaiFENesin  1,200 mg Oral BID  . heparin  5,000 Units Subcutaneous 3 times per day  . insulin aspart  0-9 Units Subcutaneous TID WC  . lidocaine      . midazolam      . pantoprazole (PROTONIX) IV  40 mg Intravenous Q24H  . piperacillin-tazobactam (ZOSYN)  IV  3.375 g Intravenous 3 times per day  . sodium chloride  3 mL Intravenous Q12H  . vancomycin  750 mg Intravenous Q24H    Allergies: No Known Allergies  Family History  Problem Relation Age of Onset  . Diabetes Mellitus II Mother   . Pneumonia Father   .  Diabetes Father     Social History:  reports that he has never smoked. He does not have any smokeless tobacco history on file. He reports that he does not drink alcohol or use illicit drugs.  ROS positive for above-mentioned symptoms in history of present illness. Patient denies any other  current complaints at this time.  Physical Exam:  Vital signs in last 24 hours: Temp:  [97.6 F (36.4 C)-98.1 F (36.7 C)] 97.6 F (36.4 C) (10/14 0620) Pulse Rate:  [78-94] 88 (10/14 1531) Resp:  [16-28] 16 (10/14 1531) BP: (99-122)/(65-79) 122/67 mmHg (10/14 1531) SpO2:  [91 %-99 %] 92 % (10/14 1531) Weight:  [77 kg (169 lb 12.1 oz)] 77 kg (169 lb 12.1 oz) (10/14 6270)  Constitutional: Vital signs reviewed. WD WN in NAD Head: Normocephalic and atraumatic   Eyes: PERRL, No scleral icterus.  Neck: Supple No  Gross JVD, mass, thyromegaly, or carotid bruit present.  Cardiovascular: RRR Pulmonary/Chest: Normal effort Abdominal: Soft. Non-tender slight abdominal distention consistent with mild ileus. No real tenderness. Genitourinary: Normal external telemetry with condom catheter. Left testicle enlarged with exam consistent with testicular neoplasm Extremities: No cyanosis or edema  Neurological: Grossly non-focal questionable general lower extremity weakness with some left-sided decreased sensation Skin: Warm,very dry and intact. No rash, cyanosis   Laboratory Data:  Results for orders placed or performed during the hospital encounter of 09/05/15 (from the past 72 hour(s))  Glucose, capillary     Status: Abnormal   Collection Time: 09/05/15  8:12 PM  Result Value Ref Range   Glucose-Capillary 120 (H) 65 - 99 mg/dL   Comment 1 Capillary Specimen   MRSA PCR Screening     Status: None   Collection Time: 09/05/15  8:28 PM  Result Value Ref Range   MRSA by PCR NEGATIVE NEGATIVE    Comment:        The GeneXpert MRSA Assay (FDA approved for NASAL specimens only), is one component of a comprehensive MRSA colonization surveillance program. It is not intended to diagnose MRSA infection nor to guide or monitor treatment for MRSA infections.   Hemoglobin A1c     Status: Abnormal   Collection Time: 09/05/15  8:43 PM  Result Value Ref Range   Hgb A1c MFr Bld 6.2 (H) 4.8 - 5.6 %     Comment: (NOTE)         Pre-diabetes: 5.7 - 6.4         Diabetes: >6.4         Glycemic control for adults with diabetes: <7.0    Mean Plasma Glucose 131 mg/dL    Comment: (NOTE) Performed At: [facility] [place], [place] [id number] Lindon Romp MD [id number]   Magnesium     Status: None   Collection Time: 09/05/15  8:43 PM  Result Value Ref Range   Magnesium 1.8 1.7 - 2.4 mg/dL  TSH     Status: None   Collection Time: 09/05/15  8:43 PM  Result Value Ref Range   TSH 2.894 0.350 - 4.500 uIU/mL  Basic metabolic panel     Status: Abnormal   Collection Time: 09/05/15  8:43 PM  Result Value Ref Range   Sodium 132 (L) 135 - 145 mmol/L   Potassium 5.0 3.5 - 5.1 mmol/L   Chloride 98 (L) 101 - 111 mmol/L   CO2 23 22 - 32 mmol/L   Glucose, Bld 117 (H) 65 - 99 mg/dL   BUN 32 (H) 6 -  20 mg/dL   Creatinine, Ser 3.24 (H) 0.61 - 1.24 mg/dL   Calcium 8.2 (L) 8.9 - 10.3 mg/dL   GFR calc non Af Amer 19 (L) >60 mL/min   GFR calc Af Amer 22 (L) >60 mL/min    Comment: (NOTE) The eGFR has been calculated using the CKD EPI equation. This calculation has not been validated in all clinical situations. eGFR's persistently <60 mL/min signify possible Chronic Kidney Disease.    Anion gap 11 5 - 15  Urinalysis, Routine w reflex microscopic (not at [facility])     Status: Abnormal   Collection Time: 09/05/15 10:20 PM  Result Value Ref Range   Color, Urine YELLOW YELLOW   APPearance CLOUDY (A) CLEAR   Specific Gravity, Urine 1.016 1.005 - 1.030   pH 5.0 5.0 - 8.0   Glucose, UA NEGATIVE NEGATIVE mg/dL   Hgb urine dipstick MODERATE (A) NEGATIVE   Bilirubin Urine NEGATIVE NEGATIVE   Ketones, ur 15 (A) NEGATIVE mg/dL   Protein, ur 100 (A) NEGATIVE mg/dL   Urobilinogen, UA 1.0 0.0 - 1.0 mg/dL   Nitrite NEGATIVE NEGATIVE   Leukocytes, UA SMALL (A) NEGATIVE  Urine culture     Status: None   Collection Time: 09/05/15 10:20 PM  Result Value Ref Range   Specimen Description  URINE, RANDOM    Special Requests NONE    Culture 3,000 COLONIES/mL INSIGNIFICANT GROWTH    Report Status 09/07/2015 FINAL   Sodium, urine, random     Status: None   Collection Time: 09/05/15 10:20 PM  Result Value Ref Range   Sodium, Ur 75 mmol/L  Creatinine, urine, random     Status: None   Collection Time: 09/05/15 10:20 PM  Result Value Ref Range   Creatinine, Urine 176.86 mg/dL  Urine microscopic-add on     Status: Abnormal   Collection Time: 09/05/15 10:20 PM  Result Value Ref Range   Squamous Epithelial / LPF RARE RARE   WBC, UA 11-20 <3 WBC/hpf   RBC / HPF 11-20 <3 RBC/hpf   Bacteria, UA FEW (A) RARE   Crystals URIC ACID CRYSTALS (A) NEGATIVE  Lactic acid, plasma     Status: None   Collection Time: 09/05/15 10:33 PM  Result Value Ref Range   Lactic Acid, Venous 1.7 0.5 - 2.0 mmol/L  Glucose, capillary     Status: Abnormal   Collection Time: 09/05/15 11:32 PM  Result Value Ref Range   Glucose-Capillary 109 (H) 65 - 99 mg/dL   Comment 1 Capillary Specimen   Magnesium     Status: None   Collection Time: 09/06/15  2:42 AM  Result Value Ref Range   Magnesium 2.3 1.7 - 2.4 mg/dL  Comprehensive metabolic panel     Status: Abnormal   Collection Time: 09/06/15  2:42 AM  Result Value Ref Range   Sodium 135 135 - 145 mmol/L   Potassium 5.0 3.5 - 5.1 mmol/L   Chloride 100 (L) 101 - 111 mmol/L   CO2 20 (L) 22 - 32 mmol/L   Glucose, Bld 116 (H) 65 - 99 mg/dL   BUN 31 (H) 6 - 20 mg/dL   Creatinine, Ser 3.14 (H) 0.61 - 1.24 mg/dL   Calcium 8.4 (L) 8.9 - 10.3 mg/dL   Total Protein 6.3 (L) 6.5 - 8.1 g/dL   Albumin 2.4 (L) 3.5 - 5.0 g/dL   AST 34 15 - 41 U/L   ALT 19 17 - 63 U/L   Alkaline Phosphatase 61 38 - 126  U/L   Total Bilirubin 1.0 0.3 - 1.2 mg/dL   GFR calc non Af Amer 19 (L) >60 mL/min   GFR calc Af Amer 22 (L) >60 mL/min    Comment: (NOTE) The eGFR has been calculated using the CKD EPI equation. This calculation has not been validated in all clinical  situations. eGFR's persistently <60 mL/min signify possible Chronic Kidney Disease.    Anion gap 15 5 - 15  CBC     Status: Abnormal   Collection Time: 09/06/15  2:42 AM  Result Value Ref Range   WBC 4.2 4.0 - 10.5 K/uL   RBC 3.97 (L) 4.22 - 5.81 MIL/uL   Hemoglobin 9.8 (L) 13.0 - 17.0 g/dL   HCT 30.7 (L) 39.0 - 52.0 %   MCV 77.3 (L) 78.0 - 100.0 fL   MCH 24.7 (L) 26.0 - 34.0 pg   MCHC 31.9 30.0 - 36.0 g/dL   RDW 16.2 (H) 11.5 - 15.5 %   Platelets 205 150 - 400 K/uL  Glucose, capillary     Status: None   Collection Time: 09/06/15  6:50 AM  Result Value Ref Range   Glucose-Capillary 90 65 - 99 mg/dL   Comment 1 Capillary Specimen   Glucose, capillary     Status: None   Collection Time: 09/06/15 12:05 PM  Result Value Ref Range   Glucose-Capillary 79 65 - 99 mg/dL   Comment 1 Capillary Specimen   Glucose, capillary     Status: None   Collection Time: 09/06/15  4:29 PM  Result Value Ref Range   Glucose-Capillary 99 65 - 99 mg/dL   Comment 1 Capillary Specimen   Glucose, capillary     Status: Abnormal   Collection Time: 09/06/15  9:07 PM  Result Value Ref Range   Glucose-Capillary 105 (H) 65 - 99 mg/dL   Comment 1 Notify RN    Comment 2 Document in Chart   Glucose, capillary     Status: None   Collection Time: 09/07/15  6:23 AM  Result Value Ref Range   Glucose-Capillary 72 65 - 99 mg/dL   Comment 1 Notify RN    Comment 2 Document in Chart   Comprehensive metabolic panel     Status: Abnormal   Collection Time: 09/07/15  9:17 AM  Result Value Ref Range   Sodium 135 135 - 145 mmol/L   Potassium 5.2 (H) 3.5 - 5.1 mmol/L   Chloride 105 101 - 111 mmol/L   CO2 21 (L) 22 - 32 mmol/L   Glucose, Bld 103 (H) 65 - 99 mg/dL   BUN 24 (H) 6 - 20 mg/dL   Creatinine, Ser 3.01 (H) 0.61 - 1.24 mg/dL   Calcium 8.2 (L) 8.9 - 10.3 mg/dL   Total Protein 6.6 6.5 - 8.1 g/dL   Albumin 2.4 (L) 3.5 - 5.0 g/dL   AST 41 15 - 41 U/L   ALT 20 17 - 63 U/L   Alkaline Phosphatase 64 38 - 126 U/L    Total Bilirubin 1.0 0.3 - 1.2 mg/dL   GFR calc non Af Amer 20 (L) >60 mL/min   GFR calc Af Amer 24 (L) >60 mL/min    Comment: (NOTE) The eGFR has been calculated using the CKD EPI equation. This calculation has not been validated in all clinical situations. eGFR's persistently <60 mL/min signify possible Chronic Kidney Disease.    Anion gap 9 5 - 15  Lactate dehydrogenase     Status: Abnormal   Collection Time:  09/07/15  9:17 AM  Result Value Ref Range   LDH 376 (H) 98 - 192 U/L  Uric acid     Status: None   Collection Time: 09/07/15  9:17 AM  Result Value Ref Range   Uric Acid, Serum 6.7 4.4 - 7.6 mg/dL  CBC with Differential/Platelet     Status: Abnormal   Collection Time: 09/07/15  9:17 AM  Result Value Ref Range   WBC 4.3 4.0 - 10.5 K/uL   RBC 3.70 (L) 4.22 - 5.81 MIL/uL   Hemoglobin 9.3 (L) 13.0 - 17.0 g/dL   HCT 28.6 (L) 39.0 - 52.0 %   MCV 77.3 (L) 78.0 - 100.0 fL   MCH 25.1 (L) 26.0 - 34.0 pg   MCHC 32.5 30.0 - 36.0 g/dL   RDW 16.5 (H) 11.5 - 15.5 %   Platelets 180 150 - 400 K/uL   Neutrophils Relative % 57 %   Neutro Abs 2.4 1.7 - 7.7 K/uL   Lymphocytes Relative 21 %   Lymphs Abs 0.9 0.7 - 4.0 K/uL   Monocytes Relative 18 %   Monocytes Absolute 0.8 0.1 - 1.0 K/uL   Eosinophils Relative 4 %   Eosinophils Absolute 0.2 0.0 - 0.7 K/uL   Basophils Relative 0 %   Basophils Absolute 0.0 0.0 - 0.1 K/uL  Culture, blood (routine x 2)     Status: None (Preliminary result)   Collection Time: 09/07/15  9:17 AM  Result Value Ref Range   Specimen Description BLOOD RIGHT ANTECUBITAL    Special Requests BOTTLES DRAWN AEROBIC AND ANAEROBIC 5CC 10CC    Culture NO GROWTH 1 DAY    Report Status PENDING   Culture, blood (routine x 2)     Status: None (Preliminary result)   Collection Time: 09/07/15  9:27 AM  Result Value Ref Range   Specimen Description BLOOD RIGHT HAND    Special Requests BOTTLES DRAWN AEROBIC AND ANAEROBIC 10CC    Culture NO GROWTH 1 DAY    Report Status  PENDING   Glucose, capillary     Status: Abnormal   Collection Time: 09/07/15 11:16 AM  Result Value Ref Range   Glucose-Capillary 113 (H) 65 - 99 mg/dL   Comment 1 Notify RN   Lactic acid, plasma     Status: None   Collection Time: 09/07/15 11:42 AM  Result Value Ref Range   Lactic Acid, Venous 1.8 0.5 - 2.0 mmol/L  Beta HCG, Quant (tumor marker)     Status: None   Collection Time: 09/07/15  4:16 PM  Result Value Ref Range   Beta hCG, Tumor Marker 1 0 - 3 mIU/mL    Comment: (NOTE) Roche ECLIA methodology Performed At: [facility] [place], [place] [id number] Lindon Romp MD [id number]   AFP tumor marker     Status: None   Collection Time: 09/07/15  4:16 PM  Result Value Ref Range   AFP-Tumor Marker 1.0 0.0 - 8.3 ng/mL    Comment: (NOTE) Roche ECLIA methodology Performed At: [facility] [place], [place] [id number] Lindon Romp MD [id number]   Glucose, capillary     Status: Abnormal   Collection Time: 09/07/15  5:09 PM  Result Value Ref Range   Glucose-Capillary 176 (H) 65 - 99 mg/dL   Comment 1 Notify RN    Comment 2 Document in Chart   Ferritin     Status: Abnormal   Collection Time: 09/07/15  6:40 PM  Result  Value Ref Range   Ferritin 766 (H) 24 - 336 ng/mL  Glucose, capillary     Status: Abnormal   Collection Time: 09/07/15  8:57 PM  Result Value Ref Range   Glucose-Capillary 276 (H) 65 - 99 mg/dL   Comment 1 Notify RN    Comment 2 Document in Chart   CBC     Status: Abnormal   Collection Time: 09/08/15  4:04 AM  Result Value Ref Range   WBC 3.8 (L) 4.0 - 10.5 K/uL   RBC 3.65 (L) 4.22 - 5.81 MIL/uL   Hemoglobin 9.2 (L) 13.0 - 17.0 g/dL   HCT 75.3 (L) 38.9 - 49.5 %   MCV 77.0 (L) 78.0 - 100.0 fL   MCH 25.2 (L) 26.0 - 34.0 pg   MCHC 32.7 30.0 - 36.0 g/dL   RDW 50.6 (H) 50.6 - 52.9 %   Platelets 189 150 - 400 K/uL  Comprehensive metabolic panel     Status: Abnormal   Collection Time: 09/08/15  4:04  AM  Result Value Ref Range   Sodium 133 (L) 135 - 145 mmol/L   Potassium 4.3 3.5 - 5.1 mmol/L    Comment: DELTA CHECK NOTED   Chloride 98 (L) 101 - 111 mmol/L   CO2 22 22 - 32 mmol/L   Glucose, Bld 225 (H) 65 - 99 mg/dL   BUN 30 (H) 6 - 20 mg/dL   Creatinine, Ser 0.95 (H) 0.61 - 1.24 mg/dL   Calcium 7.8 (L) 8.9 - 10.3 mg/dL   Total Protein 6.5 6.5 - 8.1 g/dL   Albumin 2.4 (L) 3.5 - 5.0 g/dL   AST 35 15 - 41 U/L   ALT 22 17 - 63 U/L   Alkaline Phosphatase 63 38 - 126 U/L   Total Bilirubin 0.8 0.3 - 1.2 mg/dL   GFR calc non Af Amer 19 (L) >60 mL/min   GFR calc Af Amer 22 (L) >60 mL/min    Comment: (NOTE) The eGFR has been calculated using the CKD EPI equation. This calculation has not been validated in all clinical situations. eGFR's persistently <60 mL/min signify possible Chronic Kidney Disease.    Anion gap 13 5 - 15  Glucose, capillary     Status: Abnormal   Collection Time: 09/08/15  6:18 AM  Result Value Ref Range   Glucose-Capillary 196 (H) 65 - 99 mg/dL  Glucose, capillary     Status: Abnormal   Collection Time: 09/08/15 11:21 AM  Result Value Ref Range   Glucose-Capillary 171 (H) 65 - 99 mg/dL   Comment 1 Notify RN   Glucose, capillary     Status: Abnormal   Collection Time: 09/08/15  4:45 PM  Result Value Ref Range   Glucose-Capillary 241 (H) 65 - 99 mg/dL   Recent Results (from the past 240 hour(s))  Blood culture (routine x 2)     Status: None (Preliminary result)   Collection Time: 09/05/15  3:00 PM  Result Value Ref Range Status   Specimen Description BLOOD RIGHT ARM  Final   Special Requests BOTTLES DRAWN AEROBIC AND ANAEROBIC 5CC  Final   Culture NO GROWTH 3 DAYS  Final   Report Status PENDING  Incomplete  Blood culture (routine x 2)     Status: None (Preliminary result)   Collection Time: 09/05/15  4:43 PM  Result Value Ref Range Status   Specimen Description BLOOD LEFT HAND  Final   Special Requests BOTTLES DRAWN AEROBIC AND ANAEROBIC 4CC  Final  Culture NO GROWTH 3 DAYS  Final   Report Status PENDING  Incomplete  MRSA PCR Screening     Status: None   Collection Time: 09/05/15  8:28 PM  Result Value Ref Range Status   MRSA by PCR NEGATIVE NEGATIVE Final    Comment:        The GeneXpert MRSA Assay (FDA approved for NASAL specimens only), is one component of a comprehensive MRSA colonization surveillance program. It is not intended to diagnose MRSA infection nor to guide or monitor treatment for MRSA infections.   Urine culture     Status: None   Collection Time: 09/05/15 10:20 PM  Result Value Ref Range Status   Specimen Description URINE, RANDOM  Final   Special Requests NONE  Final   Culture 3,000 COLONIES/mL INSIGNIFICANT GROWTH  Final   Report Status 09/07/2015 FINAL  Final  Culture, blood (routine x 2)     Status: None (Preliminary result)   Collection Time: 09/07/15  9:17 AM  Result Value Ref Range Status   Specimen Description BLOOD RIGHT ANTECUBITAL  Final   Special Requests BOTTLES DRAWN AEROBIC AND ANAEROBIC 5CC 10CC  Final   Culture NO GROWTH 1 DAY  Final   Report Status PENDING  Incomplete  Culture, blood (routine x 2)     Status: None (Preliminary result)   Collection Time: 09/07/15  9:27 AM  Result Value Ref Range Status   Specimen Description BLOOD RIGHT HAND  Final   Special Requests BOTTLES DRAWN AEROBIC AND ANAEROBIC 10CC  Final   Culture NO GROWTH 1 DAY  Final   Report Status PENDING  Incomplete   Creatinine:  Recent Labs  09/05/15 1500 09/05/15 2043 09/06/15 0242 09/07/15 0917 09/08/15 0404  CREATININE 3.44* 3.24* 3.14* 3.01* 3.19*   Baseline Creatinine:   Impression/Assessment:  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 Testicular cancer other than seminoma would be distinctly unusual in this age group. Seminoma is always negative for alpha-fetoprotein elevation and only shows elevation in beta  hCG 5-10% of the time. The patient does have significant chronic renal insufficiency. The left-sided hydronephrosis may be a contributing element bite given the lack of hydronephrosis on the right side his renal function is more likely elevated due to chronic renal disease secondary to his diabetes mellitus. His creatinine is trending downward slightly and therefore there is no urgent intervention needed with regard to stent placement. The patient will ultimately require a left radical orchiectomy. Concurrently he may require cystoscopy with retrograde pyelogram and stent placement. We had tentatively put him on the schedule for Monday morning to transfer to [facility] long for that procedure. I think it best at this point to delay a definitive procedure until we have the retroperitoneal biopsy results. A complete strategy for his care can then be established. There really is no urgent need to intervene with regard to what appears to be relatively mild left hydronephrosis.  Plan:  As above. We'll currently not plan for any surgical intervention until we have biopsy results on the retroperitoneal mass.  Vickie Ponds S 09/08/2015, 8:11 PM      Beta-hCG

## 2015-09-08 NOTE — Procedures (Signed)
L para-aortic mass biopsy 18 g times three No comp/EBL

## 2015-09-09 DIAGNOSIS — I2510 Atherosclerotic heart disease of native coronary artery without angina pectoris: Secondary | ICD-10-CM

## 2015-09-09 DIAGNOSIS — M5442 Lumbago with sciatica, left side: Secondary | ICD-10-CM

## 2015-09-09 DIAGNOSIS — M549 Dorsalgia, unspecified: Secondary | ICD-10-CM | POA: Insufficient documentation

## 2015-09-09 LAB — CBC
HCT: 30.6 % — ABNORMAL LOW (ref 39.0–52.0)
Hemoglobin: 10 g/dL — ABNORMAL LOW (ref 13.0–17.0)
MCH: 24.9 pg — AB (ref 26.0–34.0)
MCHC: 32.7 g/dL (ref 30.0–36.0)
MCV: 76.3 fL — AB (ref 78.0–100.0)
PLATELETS: 229 10*3/uL (ref 150–400)
RBC: 4.01 MIL/uL — AB (ref 4.22–5.81)
RDW: 16.7 % — AB (ref 11.5–15.5)
WBC: 5.8 10*3/uL (ref 4.0–10.5)

## 2015-09-09 LAB — COMPREHENSIVE METABOLIC PANEL
ALT: 18 U/L (ref 17–63)
AST: 25 U/L (ref 15–41)
Albumin: 2.3 g/dL — ABNORMAL LOW (ref 3.5–5.0)
Alkaline Phosphatase: 54 U/L (ref 38–126)
Anion gap: 12 (ref 5–15)
BUN: 38 mg/dL — ABNORMAL HIGH (ref 6–20)
CHLORIDE: 102 mmol/L (ref 101–111)
CO2: 24 mmol/L (ref 22–32)
CREATININE: 3.14 mg/dL — AB (ref 0.61–1.24)
Calcium: 8.3 mg/dL — ABNORMAL LOW (ref 8.9–10.3)
GFR, EST AFRICAN AMERICAN: 22 mL/min — AB (ref >60)
GFR, EST NON AFRICAN AMERICAN: 19 mL/min — AB (ref >60)
Glucose, Bld: 248 mg/dL — ABNORMAL HIGH (ref 65–99)
POTASSIUM: 4.3 mmol/L (ref 3.5–5.1)
SODIUM: 138 mmol/L (ref 135–145)
Total Bilirubin: 0.7 mg/dL (ref 0.3–1.2)
Total Protein: 6.8 g/dL (ref 6.5–8.1)

## 2015-09-09 LAB — GLUCOSE, CAPILLARY
GLUCOSE-CAPILLARY: 224 mg/dL — AB (ref 65–99)
GLUCOSE-CAPILLARY: 269 mg/dL — AB (ref 65–99)
GLUCOSE-CAPILLARY: 270 mg/dL — AB (ref 65–99)
GLUCOSE-CAPILLARY: 293 mg/dL — AB (ref 65–99)

## 2015-09-09 LAB — MAGNESIUM: MAGNESIUM: 2 mg/dL (ref 1.7–2.4)

## 2015-09-09 MED ORDER — DEXTROSE 5 % IV SOLN
1.0000 g | INTRAVENOUS | Status: DC
  Administered 2015-09-09 – 2015-09-11 (×3): 1 g via INTRAVENOUS
  Filled 2015-09-09 (×4): qty 10

## 2015-09-09 MED ORDER — SODIUM CHLORIDE 0.9 % IV SOLN
INTRAVENOUS | Status: DC
  Administered 2015-09-09 – 2015-09-13 (×6): via INTRAVENOUS

## 2015-09-09 NOTE — Clinical Social Work Placement (Signed)
   CLINICAL SOCIAL WORK PLACEMENT  NOTE  Date:  09/09/2015  Patient Details  Name: Constantine Ruddick MRN: [id number] Date of Birth: [birth year]  Clinical Social Work is seeking post-discharge placement for this patient at the [place] level of care (*CSW will initial, date and re-position this form in  chart as items are completed):  Yes   Patient/family provided with [facility] Work Department's list of facilities offering this level of care within the geographic area requested by the patient (or if unable, by the patient's family).  Yes   Patient/family informed of their freedom to choose among providers that offer the needed level of care, that participate in Medicare, Medicaid or managed care program needed by the patient, have an available bed and are willing to accept the patient.  Yes   Patient/family informed of [facility]'s ownership interest in [facility] and [facility], as well as of the fact that they are under no obligation to receive care at these facilities.  PASRR submitted to EDS on       PASRR number received on       Existing PASRR number confirmed on 09/09/15     FL2 transmitted to all facilities in geographic area requested by pt/family on       FL2 transmitted to all facilities within larger geographic area on       Patient informed that his/her managed care company has contracts with or will negotiate with certain facilities, including the following:   [facility])         Patient/family informed of bed offers received.  Patient chooses bed at       Physician recommends and patient chooses bed at      Patient to be transferred to   on  .  Patient to be transferred to facility by Ambulance  [facility])     Patient family notified on   of transfer.  Name of family member notified:        PHYSICIAN Please prepare priority discharge summary, including medications, Please sign FL2, Please sign DNR, Please  prepare prescriptions     Additional Comment:    _______________________________________________ Williemae Area, LCSW 09/09/2015, 5:00 PM

## 2015-09-09 NOTE — Progress Notes (Signed)
[facility] PROGRESS NOTE  Carlos Black NTZ:[id number] DOB: November 12, 1951 DOA: 09/05/2015 PCP: [name], MD  Length of stay: 4   Assessment/Plan: Principal Problem:   SIRS (systemic inflammatory response syndrome) (HCC) Active Problems:   Diabetes mellitus (HCC)   Weakness   Hypotension   Hyperkalemia   ARF (acute renal failure) (HCC)   Dehydration   Left sided numbness   H/O: CVA (cerebrovascular accident)   CAD (coronary artery disease)   Oral thrush   Dysphagia   Anemia   Hyponatremia   Weakness of left side of body   Pelvic mass in male   Back pain    #1 Tumor fever vs UTI  Fever resolved, likely secondary to steroids. Patient on admission  noted to have a fever 101.4, tachycardic, tachypneic, and borderline hypotension. Chest x-ray negative for any acute infiltrates. Lactic acid level at 1.99. Urinalysis shows UTI,.  Blood culture negative, discontinue broad-spectrum antibiotics today, switched to Rocephin for UTI Negative Panculture.   pro-calcitonin 0.86.   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   Patient with prior history of CVA risk factors of diabetes hypertension coronary artery disease.    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 Tried to insert Foley but failed, would appreciate if urology could place a Foley Discussed with urology about the patient's left retroperitoneal mass, [name] on 10/13. He  reviewed the MRI and does not feel that there is any evidence of extrinsic compression on the kidney resulting in renal failure based on the MRI However CT abdomen pelvis shows left-sided hydronephrosis Discussed with [name] on 10/14 . 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 No origin need for intervention at this time  Anemia In the setting of renal disease, possible neoplasia, hemoglobin stable around 10  Chronic kidney disease stage IV-creatinine of 3.14, GFR less than 30 likely his baseline  #4 hyperkalemia Resolved  #6  oral thrush Continue diflucan.  #7 dysphagia Consult speech therapy for swallow evaluation. We'll keep nothing by mouth for now.  #8 hyponatremia Likely secondary to hypovolemic hyponatremia , improving  #9 diabetes mellitus   hemoglobin A1c 6.2 . Currently has steroid-induced hyperglycemia. Continue sliding scale insulin. Hold by mouth hypoglycemics  #10 history of CVA No acute CVA this admission  #11 hyperlipidemia Once tolerating oral intake will resume home regimen of statin.  #12 hypertension Hold antihypertensives for now. Follow.   DVT  prophylaxsis heparin  Code Status:      Code Status Orders        Start     Ordered   09/05/15  1958  Do not attempt resuscitation (DNR)   Continuous    Question Answer Comment  In the event of cardiac or respiratory ARREST Do not call a "code blue"   In the event of cardiac or respiratory ARREST Do not perform Intubation, CPR, defibrillation or ACLS   In the event of cardiac or respiratory ARREST Use medication by any route, position, wound care, and other measures to relive pain and suffering. May use oxygen, suction and manual treatment of airway obstruction as needed for comfort.      09/05/15 1957     Family Communication: family updated about patient's clinical progress Disposition Plan: SNF versus CIR    Brief narrative: 64 y o with history of CVA with residual left sided weakness, DM, HTN, CAD who presented to ED with sudden onset of left lower extremity numbness and left-sided weakness on the day of admission. Patient stated that he woke up around 10 or 11 AM went to try to use the commode when he noticed a numbness in his left leg. Patient stated that he was extremely weak and unable to get off the commode and had to have 3 people help him up. Patient also endorses a cough or caring mostly at night over the past 2-3 weeks which is productive in nature with some brownish sputum. Patient denies any fevers, no chills, no nausea, no chest pain, no emesis, no shortness of breath, no abdominal pain, no diarrhea, no constipation, no dysuria, no melena, no hematemesis, no hematochezia, no visual changes, no facial droop, no slurred speech. Patient was in the emergency room CT head which was done was negative. Patient was noted in the ED to have systolic blood pressures in the 90s with a temp of 101.4 respiratory rate in the 30s and tachycardic with heart rate up to 109. Patient was given a liter bolus. Comprehensive metabolic profile obtained had a sodium of 129 potassium of 5.9 chloride of 93 BUN of 37 creatinine of 3.44 albumin of 3.0 AST of 50 bilirubin of 1.3 otherwise was within normal  limits. Lactic acid level was 1.99. CBC had a hemoglobin of 11.2 otherwise was within normal limits. Chest x-ray which was done was negative for any acute infiltrate. Urinalysis was pending. Blood cultures were drawn. Patient was given a dose of IV vancomycin and IV Zosyn. [facility] were called to admit the patient for further evaluation and management.  Consultants:  None   Procedures None  Antibiotics: Anti-infectives    Start     Dose/Rate Route Frequency Ordered Stop   09/07/15 1800  vancomycin (VANCOCIN) IVPB 750 mg/150 ml premix     750 mg 150 mL/hr over 60 Minutes Intravenous Every 24 hours 09/07/15 1141     09/07/15 1730  vancomycin (VANCOCIN) IVPB 1000 mg/200 mL premix  Status:  Discontinued     1,000 mg 200 mL/hr over 60 Minutes Intravenous Every 48 hours 09/05/15 1712 09/07/15 1141   09/06/15 2200  piperacillin-tazobactam (ZOSYN) IVPB 3.375 g     3.375 g 12.5 mL/hr over 240 Minutes Intravenous 3 times per day 09/06/15 1304     09/06/15 1000  fluconazole (DIFLUCAN) IVPB 100 mg     100 mg 50 mL/hr over 60 Minutes Intravenous Every 24 hours 09/05/15 1947     09/05/15 2200  fluconazole (DIFLUCAN) IVPB  200 mg     200 mg 100 mL/hr over 60 Minutes Intravenous  Once 09/05/15 1946 09/05/15 2316   09/05/15 1800  piperacillin-tazobactam (ZOSYN) IVPB 3.375 g  Status:  Discontinued     3.375 g 100 mL/hr over 30 Minutes Intravenous  Once 09/05/15 1758 09/05/15 1800   09/05/15 1800  vancomycin (VANCOCIN) IVPB 1000 mg/200 mL premix  Status:  Discontinued     1,000 mg 200 mL/hr over 60 Minutes Intravenous  Once 09/05/15 1758 09/05/15 1759   09/05/15 1730  piperacillin-tazobactam (ZOSYN) IVPB 2.25 g  Status:  Discontinued     2.25 g 100 mL/hr over 30 Minutes Intravenous 4 times per day 09/05/15 1712 09/06/15 1304   09/05/15 1730  vancomycin (VANCOCIN) IVPB 1000 mg/200 mL premix  Status:  Discontinued     1,000 mg 200 mL/hr over 60 Minutes Intravenous  Once 09/05/15 1712  09/05/15 1715   09/05/15 1730  vancomycin (VANCOCIN) 1,500 mg in sodium chloride 0.9 % 500 mL IVPB     1,500 mg 250 mL/hr over 120 Minutes Intravenous  Once 09/05/15 1715 09/05/15 2026         HPI/Subjective: Strength in his left lower extremities improving, CBG elevated   Objective: Filed Vitals:   09/08/15 1447 09/08/15 1501 09/08/15 1531 09/09/15 0502  BP: 114/68 112/68 122/67 119/80  Pulse: 94 90 88 84  Temp:    97.5 F (36.4 C)  TempSrc:    Oral  Resp: 20 18 16 18   Height:      Weight:    74.072 kg (163 lb 4.8 oz)  SpO2: 92% 91% 92% 93%    Intake/Output Summary (Last 24 hours) at 09/09/15 1121 Last data filed at 09/09/15 0505  Gross per 24 hour  Intake      0 ml  Output    750 ml  Net   -750 ml    Exam:  General: No acute respiratory distress Lungs: Clear to auscultation bilaterally without wheezes or crackles Cardiovascular: Regular rate and rhythm without murmur gallop or rub normal S1 and S2 Abdomen: Nontender, nondistended, soft, bowel sounds positive, no rebound, no ascites, no appreciable mass Extremities: No significant cyanosis, clubbing, or edema bilateral lower extremities     Data Review   Micro Results Recent Results (from the past 240 hour(s))  Blood culture (routine x 2)     Status: None (Preliminary result)   Collection Time: 09/05/15  3:00 PM  Result Value Ref Range Status   Specimen Description BLOOD RIGHT ARM  Final   Special Requests BOTTLES DRAWN AEROBIC AND ANAEROBIC 5CC  Final   Culture NO GROWTH 3 DAYS  Final   Report Status PENDING  Incomplete  Blood culture (routine x 2)     Status: None (Preliminary result)   Collection Time: 09/05/15  4:43 PM  Result Value Ref Range Status   Specimen Description BLOOD LEFT HAND  Final   Special Requests BOTTLES DRAWN AEROBIC AND ANAEROBIC 4CC  Final   Culture NO GROWTH 3 DAYS  Final   Report Status PENDING  Incomplete  MRSA PCR Screening     Status: None   Collection Time: 09/05/15   8:28 PM  Result Value Ref Range Status   MRSA by PCR NEGATIVE NEGATIVE Final    Comment:        The GeneXpert MRSA Assay (FDA approved for NASAL specimens only), is one component of a comprehensive MRSA colonization surveillance program. It is not intended to diagnose MRSA infection nor  to guide or monitor treatment for MRSA infections.   Urine culture     Status: None   Collection Time: 09/05/15 10:20 PM  Result Value Ref Range Status   Specimen Description URINE, RANDOM  Final   Special Requests NONE  Final   Culture 3,000 COLONIES/mL INSIGNIFICANT GROWTH  Final   Report Status 09/07/2015 FINAL  Final  Culture, blood (routine x 2)     Status: None (Preliminary result)   Collection Time: 09/07/15  9:17 AM  Result Value Ref Range Status   Specimen Description BLOOD RIGHT ANTECUBITAL  Final   Special Requests BOTTLES DRAWN AEROBIC AND ANAEROBIC 5CC 10CC  Final   Culture NO GROWTH 1 DAY  Final   Report Status PENDING  Incomplete  Culture, blood (routine x 2)     Status: None (Preliminary result)   Collection Time: 09/07/15  9:27 AM  Result Value Ref Range Status   Specimen Description BLOOD RIGHT HAND  Final   Special Requests BOTTLES DRAWN AEROBIC AND ANAEROBIC 10CC  Final   Culture NO GROWTH 1 DAY  Final   Report Status PENDING  Incomplete    Radiology Reports Ct Abdomen Pelvis Wo Contrast  09/07/2015  CLINICAL DATA:  Evaluate retroperitoneal mass seen on recent MRI of the lumbar spine. EXAM: CT CHEST, ABDOMEN AND PELVIS WITHOUT CONTRAST TECHNIQUE: Multidetector CT imaging of the chest, abdomen and pelvis was performed following the standard protocol without IV contrast. COMPARISON:  Lumbar spine MRI 09/06/2015 and renal ultrasound 09/06/2015 FINDINGS: CT CHEST FINDINGS Mediastinum/Lymph Nodes: No chest wall mass, supraclavicular or axillary lymphadenopathy. Small scattered lymph nodes are noted. The thyroid gland is grossly normal. The heart is normal in size. No pericardial  effusion. The aorta is normal in caliber. Mild tortuosity and scattered atherosclerotic calcifications. There are extensive 3 vessel coronary artery calcifications. Moderate distal esophageal wall thickening. Could not exclude neoplasm or esophagitis. No paraesophageal lymphadenopathy. Lungs/Pleura: Limited examination due to breathing motion artifact. There is a small left pleural effusion and areas of atelectasis. Rounded pleural density noted posteriorly at the right lung apex measures -45 Hounsfield units and is probably a small benign pleural lipoma. No worrisome pulmonary lesions to suggest a primary lung neoplasm or metastatic disease. Musculoskeletal: No significant bony findings. CT ABDOMEN PELVIS FINDINGS Hepatobiliary: No obvious hepatic lesions without contrast. Mild hepatomegaly. The gallbladder is distended and there are small gallstones noted. Pancreas: No pancreatic mass, inflammation or ductal dilatation. Spleen: Upper limits of normal.  No focal lesions. Adrenals/Urinary Tract: Bilateral adrenal gland masses. The right measures a maximum of 37.5 mm and the left 31 mm. No obvious renal mass. There is left-sided hydronephrosis due to a large left-sided retroperitoneal mass which is surrounding the left ureter and lower pole region of the left kidney. I do not see a fat plane between this mass and the aorta. It extends down into the upper left pelvis. This could reflect aggressive lymphoma or retroperitoneal sarcoma. The mass measures approximately 16.4 x 9.7 x 8.7 cm. Recommend image guided biopsy. PET-CT may also be helpful for further evaluation and accurate staging. Stomach/Bowel: The stomach, duodenum, small bowel and colon are grossly normal. No inflammatory changes, mass lesions or obstructive findings. The terminal ileum is normal. Vascular/Lymphatic: Large left-sided retroperitoneal mass as discussed above. Moderate atherosclerotic calcifications involving the aorta but no aneurysm.  Reproductive: The bladder, prostate gland and seminal vesicles are unremarkable. Other: 3.6 x 2.1 cm internal iliac nodal mass on the left side. Small amount of free the pelvic  fluid and presacral edema. Bilateral inguinal hernias containing fat and fluid and small bowel on the right side. Suspect undescended testicle in the left inguinal canal. This would raise the possibility of testicular cancer. No inguinal adenopathy. Musculoskeletal: No significant bony findings. IMPRESSION: 1. Bulky left-sided retroperitoneal mass which is obstructing the left ureter and causing left-sided hydronephrosis. There are also bilateral adrenal gland masses. Findings could be due to aggressive lymphoma, retroperitoneal sarcoma or possibly testicular carcinoma given the fact that it appears the patients left testicle is in the left inguinal canal. No obvious testicular mass. Scrotal ultrasound may be helpful for further evaluation. Image guided Biopsy is recommended of the left retroperitoneal mass. 2. No significant findings in the chest. No evidence of a primary lung neoplasm or metastatic pulmonary disease. No mediastinal or hilar mass or adenopathy. 3. Extensive 3 vessel coronary artery calcifications. 4. Small left pleural effusion. 5. Distended gallbladder and cholelithiasis. 6. Bilateral inguinal hernias containing fat, fluid and small bowel on the right side. Electronically Signed   By: Marijo Sanes M.D.   On: 09/07/2015 13:35   Dg Chest 2 View  09/05/2015  CLINICAL DATA:  64 year old with acute onset of left lower extremity paresis/numbness which began earlier today. Fever. Current history of hypertension and diabetes. Prior stroke and MI. EXAM: CHEST  2 VIEW COMPARISON:  07/26/2004, 07/20/2004. FINDINGS: AP semi-erect and lateral images were obtained. Chronic elevation of the right hemidiaphragm with chronic scar/atelectasis involving the right lower lobe and right middle lobe, unchanged. Lungs otherwise clear. No  localized airspace consolidation. No pleural effusions. No pneumothorax. Normal pulmonary vascularity. Cardiac silhouette upper normal in size for AP technique. Thoracic aorta mildly atherosclerotic and tortuous, unchanged. Hilar and mediastinal contours otherwise unremarkable. Visualized bony thorax intact with only mild degenerative changes in the mid thoracic region. IMPRESSION: 1.  No acute cardiopulmonary disease. 2. Stable chronic elevation of the right hemidiaphragm and chronic scar/atelectasis involving the right lower lobe and right middle lobe. Electronically Signed   By: Evangeline Dakin M.D.   On: 09/05/2015 15:26   Ct Head Wo Contrast  09/05/2015  CLINICAL DATA:  Left-sided weakness from previous CVA, weakness has increased over the last day with difficulty walking EXAM: CT HEAD WITHOUT CONTRAST TECHNIQUE: Contiguous axial images were obtained from the base of the skull through the vertex without intravenous contrast. COMPARISON:  12/21/2011 FINDINGS: The bony calvarium is intact. Atrophic changes are noted. No findings to suggest acute hemorrhage, acute infarction or space-occupying mass lesion are noted. IMPRESSION: Atrophic changes without acute abnormality. Electronically Signed   By: Inez Catalina M.D.   On: 09/05/2015 16:37   Ct Chest Wo Contrast  09/07/2015  CLINICAL DATA:  Evaluate retroperitoneal mass seen on recent MRI of the lumbar spine. EXAM: CT CHEST, ABDOMEN AND PELVIS WITHOUT CONTRAST TECHNIQUE: Multidetector CT imaging of the chest, abdomen and pelvis was performed following the standard protocol without IV contrast. COMPARISON:  Lumbar spine MRI 09/06/2015 and renal ultrasound 09/06/2015 FINDINGS: CT CHEST FINDINGS Mediastinum/Lymph Nodes: No chest wall mass, supraclavicular or axillary lymphadenopathy. Small scattered lymph nodes are noted. The thyroid gland is grossly normal. The heart is normal in size. No pericardial effusion. The aorta is normal in caliber. Mild tortuosity  and scattered atherosclerotic calcifications. There are extensive 3 vessel coronary artery calcifications. Moderate distal esophageal wall thickening. Could not exclude neoplasm or esophagitis. No paraesophageal lymphadenopathy. Lungs/Pleura: Limited examination due to breathing motion artifact. There is a small left pleural effusion and areas of atelectasis. Rounded pleural density  noted posteriorly at the right lung apex measures -45 Hounsfield units and is probably a small benign pleural lipoma. No worrisome pulmonary lesions to suggest a primary lung neoplasm or metastatic disease. Musculoskeletal: No significant bony findings. CT ABDOMEN PELVIS FINDINGS Hepatobiliary: No obvious hepatic lesions without contrast. Mild hepatomegaly. The gallbladder is distended and there are small gallstones noted. Pancreas: No pancreatic mass, inflammation or ductal dilatation. Spleen: Upper limits of normal.  No focal lesions. Adrenals/Urinary Tract: Bilateral adrenal gland masses. The right measures a maximum of 37.5 mm and the left 31 mm. No obvious renal mass. There is left-sided hydronephrosis due to a large left-sided retroperitoneal mass which is surrounding the left ureter and lower pole region of the left kidney. I do not see a fat plane between this mass and the aorta. It extends down into the upper left pelvis. This could reflect aggressive lymphoma or retroperitoneal sarcoma. The mass measures approximately 16.4 x 9.7 x 8.7 cm. Recommend image guided biopsy. PET-CT may also be helpful for further evaluation and accurate staging. Stomach/Bowel: The stomach, duodenum, small bowel and colon are grossly normal. No inflammatory changes, mass lesions or obstructive findings. The terminal ileum is normal. Vascular/Lymphatic: Large left-sided retroperitoneal mass as discussed above. Moderate atherosclerotic calcifications involving the aorta but no aneurysm. Reproductive: The bladder, prostate gland and seminal vesicles are  unremarkable. Other: 3.6 x 2.1 cm internal iliac nodal mass on the left side. Small amount of free the pelvic fluid and presacral edema. Bilateral inguinal hernias containing fat and fluid and small bowel on the right side. Suspect undescended testicle in the left inguinal canal. This would raise the possibility of testicular cancer. No inguinal adenopathy. Musculoskeletal: No significant bony findings. IMPRESSION: 1. Bulky left-sided retroperitoneal mass which is obstructing the left ureter and causing left-sided hydronephrosis. There are also bilateral adrenal gland masses. Findings could be due to aggressive lymphoma, retroperitoneal sarcoma or possibly testicular carcinoma given the fact that it appears the patients left testicle is in the left inguinal canal. No obvious testicular mass. Scrotal ultrasound may be helpful for further evaluation. Image guided Biopsy is recommended of the left retroperitoneal mass. 2. No significant findings in the chest. No evidence of a primary lung neoplasm or metastatic pulmonary disease. No mediastinal or hilar mass or adenopathy. 3. Extensive 3 vessel coronary artery calcifications. 4. Small left pleural effusion. 5. Distended gallbladder and cholelithiasis. 6. Bilateral inguinal hernias containing fat, fluid and small bowel on the right side. Electronically Signed   By: Marijo Sanes M.D.   On: 09/07/2015 13:35   Mr Brain Wo Contrast  09/06/2015  CLINICAL DATA:  LEFT lower extremity numbness and LEFT-sided weakness for 2 days. Night cough for 2-3 weeks. History of stroke and LEFT-sided weakness, diabetes, hypertension, coronary artery disease. EXAM: MRI HEAD WITHOUT CONTRAST TECHNIQUE: Multiplanar, multiecho pulse sequences of the brain and surrounding structures were obtained without intravenous contrast. COMPARISON:  CT head September 05, 2015 at 1626 hours FINDINGS: Multiple sequences are mild or moderately motion degraded. No reduced diffusion to suggest acute  ischemia. No susceptibility artifact to suggest hemorrhage. Ventricles and sulci are normal for patient's age. Old RIGHT ventral pons infarct. Old RIGHT basal ganglia lacunar infarct. Mild RIGHT cerebral peduncle volume loss compatible with wallerian degeneration. Mild white matter changes compatible with chronic small vessel ischemic disease, decreased sensitivity due to patient motion. No abnormal extra-axial fluid collections. Dolicoectatic intracranial vessel flow voids seen at the skull base. Ocular globes and orbital contents are grossly normal though motion degraded.  Paranasal sinuses and mastoid air cells are well aerated. No abnormal sellar expansion. No is abnormal cerebellar tonsillar descent below the foramen magnum. No suspicious bone marrow signal. IMPRESSION: No acute intracranial process on this motion degraded examination. Involutional changes and mild chronic small vessel ischemic disease. Old RIGHT pontine infarct. Old RIGHT basal ganglia lacunar infarct. Electronically Signed   By: Elon Alas M.D.   On: 09/06/2015 02:55   Mr Lumbar Spine Wo Contrast  09/06/2015  CLINICAL DATA:  LEFT lower extremity numbness and LEFT-sided weakness for 2 days. History of stroke in LEFT-sided weakness, diabetes, hypertension, coronary artery disease. EXAM: MRI LUMBAR SPINE WITHOUT CONTRAST TECHNIQUE: Multiplanar, multisequence MR imaging of the lumbar spine was performed. No intravenous contrast was administered. COMPARISON:  None. FINDINGS: Lumbar vertebral bodies and posterior elements are intact and aligned with maintenance of lumbar lordosis. Intervertebral discs demonstrate normal morphology, with slight decreased T2 signal within the lower lumbar disc consistent with mild desiccation. Minimal subacute discogenic endplate changes at all lumbar levels, no STIR signal abnormality to suggest acute osseous process. Conus medullaris terminates at L1 and appears normal in morphology and signal  characteristics. Limited assessment of cauda equina due to patient motion. LEFT abdominal mass partially imaged, with mass effect on the LEFT psoas muscle. Probable lymphadenopathy along LEFT Common iliac chain. Probable obstructive uropathy on the LEFT. Partially imaged sub cm gallstone. Level by level evaluation: L1-2 and L2-3: No significant disc bulge, canal stenosis or neural foraminal narrowing. L3-4: Small broad-based disc bulge, mild facet and ligamentum flavum redundancy without canal stenosis. Minimal bilateral neural foraminal narrowing. L4-5: Small broad-based disc bulge asymmetric to LEFT. Mild facet arthropathy and ligamentum flavum redundancy without canal stenosis. Minimal LEFT neural foraminal narrowing. L5-S1: No disc bulge, canal stenosis nor neural foraminal narrowing. IMPRESSION: No acute lumbar spine fracture nor malalignment. No canal stenosis. Minimal L3-4 and L4-5 neural foraminal narrowing. Large LEFT retroperitoneal mass with mass effect on the LEFT psoas muscle and probable lymphadenopathy along LEFT iliac chain. Recommend CT of the abdomen and pelvis with contrast. Mass appears to result in LEFT obstructive uropathy. Electronically Signed   By: Elon Alas M.D.   On: 09/06/2015 23:18   US Scrotum  09/07/2015  CLINICAL DATA:  64 year old male with palpable testicular mass. EXAM: ULTRASOUND OF SCROTUM TECHNIQUE: Complete ultrasound examination of the testicles, epididymis, and other scrotal structures was performed. COMPARISON:  None. FINDINGS: Right testicle Measurements: 4.5 x 2.6 x 2 cm. No mass or microlithiasis visualized. Left testicle Measurements: 4.4 x 3.4 x 2.9 cm. A 2.2 x 3.3 x 2.3 cm oval hypoechoic area within the left testicle is noted with some vascular flow. No other abnormalities identified. Right epididymis:  Not visualized Left epididymis:  Normal in size and appearance. Hydrocele:  Small - moderate bilateral hydroceles noted. Varicocele:  Mild left varicocele  noted. IMPRESSION: 2.2 x 3.3 x 2.3 cm oval hypoechoic area within the left testicle suspicious for testicular mass/neoplasm. Urology consultation is recommended. Unremarkable right testicle. Small to moderate bilateral hydroceles. Electronically Signed   By: Margarette Canada M.D.   On: 09/07/2015 20:02   US Renal  09/06/2015  CLINICAL DATA:  Acute renal failure. EXAM: RENAL / URINARY TRACT ULTRASOUND COMPLETE COMPARISON:  None. FINDINGS: Right Kidney: Length: 9.6 cm. Heterogeneous increased parenchymal echotexture with parenchymal thinning suggesting chronic medical renal disease. No hydronephrosis or solid mass. Left Kidney: Length: 9.8 cm. Heterogeneous increased parenchymal echotexture likely indicating chronic medical renal disease. Appears to be circumscribed hypoechoic appearance of the  lower pole of the left kidney. This may indicate edema versus abscess. No hydronephrosis or solid mass identified. Bladder: No bladder wall thickening or filling defect. Bilateral urine flow jets are demonstrated on color flow Doppler imaging. Incidental note of mildly enlarged prostate gland measuring 3.6 x 3.6 x 3.3 cm. IMPRESSION: Heterogeneous parenchymal echotexture bilaterally suggesting chronic medical renal disease. Hypoechoic appearance of the lower pole left kidney could indicate edema, pyelonephritis, or abscess. No hydronephrosis in either kidney. Mild prostate enlargement. Electronically Signed   By: Lucienne Capers M.D.   On: 09/06/2015 03:19   Ct Biopsy  09/08/2015  CLINICAL DATA:  Left retroperitoneal mass.  Left testicular mass. EXAM: CT-GUIDED BIOPSY LEFT RETROPERITONEAL MASS.  CORE. MEDICATIONS AND MEDICAL HISTORY: Versed 1 mg, Fentanyl 25 mcg. Additional Medications: None. ANESTHESIA/SEDATION: Moderate sedation time: 6 minutes PROCEDURE: The procedure, risks, benefits, and alternatives were explained to the patient. Questions regarding the procedure were encouraged and answered. The patient understands  and consents to the procedure. The back was prepped with ChloraPrep in a sterile fashion, and a sterile drape was applied covering the operative field. A sterile gown and sterile gloves were used for the procedure. Under CT guidance, a(n) 17 gauge guide needle was advanced into the left retroperitoneal mass. Subsequently 3 18 gauge core biopsies were obtained. The guide needle was removed. Final imaging was performed. Patient tolerated the procedure well without complication. Vital sign monitoring by nursing staff during the procedure will continue as patient is in the special procedures unit for post procedure observation. FINDINGS: The images document guide needle placement within the left retroperitoneal mass. Post biopsy images demonstrate no hemorrhage. COMPLICATIONS: None IMPRESSION: Successful CT-guided core biopsy of a left retroperitoneal mass. Electronically Signed   By: Marybelle Killings M.D.   On: 09/08/2015 16:50   Portable Chest 1 View  09/06/2015  CLINICAL DATA:  Fever. EXAM: PORTABLE CHEST 1 VIEW COMPARISON:  09/05/2015.  07/20/2004. FINDINGS: Mediastinum and hilar structures are normal. Heart size stable. Persistent right lower lobe subsegmental atelectasis and/or scarring. No pleural effusion pneumothorax. IMPRESSION: Stable right base subsegmental atelectasis and/or scarring. No acute cardiopulmonary disease. Electronically Signed   By: Marcello Moores  Register   On: 09/06/2015 07:19     CBC  Recent Labs Lab 09/05/15 1500 09/06/15 0242 09/07/15 0917 09/08/15 0404 09/09/15 0353  WBC 5.9 4.2 4.3 3.8* 5.8  HGB 11.2* 9.8* 9.3* 9.2* 10.0*  HCT 34.3* 30.7* 28.6* 28.1* 30.6*  PLT 267 205 180 189 229  MCV 75.9* 77.3* 77.3* 77.0* 76.3*  MCH 24.8* 24.7* 25.1* 25.2* 24.9*  MCHC 32.7 31.9 32.5 32.7 32.7  RDW 16.1* 16.2* 16.5* 16.6* 16.7*  LYMPHSABS 0.7  --  0.9  --   --   MONOABS 1.6*  --  0.8  --   --   EOSABS 0.1  --  0.2  --   --   BASOSABS 0.0  --  0.0  --   --     Chemistries   Recent  Labs Lab 09/05/15 1500 09/05/15 1837 09/05/15 2043 09/06/15 0242 09/07/15 0917 09/08/15 0404 09/09/15 0353  NA 129*  --  132* 135 135 133* 138  K 5.9*  --  5.0 5.0 5.2* 4.3 4.3  CL 93*  --  98* 100* 105 98* 102  CO2 25  --  23 20* 21* 22 24  GLUCOSE 150*  --  117* 116* 103* 225* 248*  BUN 37*  --  32* 31* 24* 30* 38*  CREATININE 3.44*  --  3.24* 3.14* 3.01* 3.19* 3.14*  CALCIUM 9.2  --  8.2* 8.4* 8.2* 7.8* 8.3*  MG  --  1.6* 1.8 2.3  --   --   --   AST 50*  --   --  34 41 35 25  ALT 21  --   --  19 20 22 18   ALKPHOS 79  --   --  61 64 63 54  BILITOT 1.3*  --   --  1.0 1.0 0.8 0.7   ------------------------------------------------------------------------------------------------------------------ estimated creatinine clearance is 22.1 mL/min (by C-G formula based on Cr of 3.14). ------------------------------------------------------------------------------------------------------------------ No results for input(s): HGBA1C in the last 72 hours. ------------------------------------------------------------------------------------------------------------------ No results for input(s): CHOL, HDL, LDLCALC, TRIG, CHOLHDL, LDLDIRECT in the last 72 hours. ------------------------------------------------------------------------------------------------------------------ No results for input(s): TSH, T4TOTAL, T3FREE, THYROIDAB in the last 72 hours.  Invalid input(s): FREET3 ------------------------------------------------------------------------------------------------------------------  Recent Labs  09/07/15 1840  FERRITIN 766*    Coagulation profile  Recent Labs Lab 09/05/15 1500  INR 1.26    No results for input(s): DDIMER in the last 72 hours.  Cardiac Enzymes  Recent Labs Lab 09/05/15 1837  TROPONINI <0.03   ------------------------------------------------------------------------------------------------------------------ Invalid input(s): POCBNP   CBG:  Recent  Labs Lab 09/08/15 0618 09/08/15 1121 09/08/15 1645 09/08/15 2135 09/09/15 0625  GLUCAP 196* 171* 241* 245* 224*       Studies: Ct Abdomen Pelvis Wo Contrast  09/07/2015  CLINICAL DATA:  Evaluate retroperitoneal mass seen on recent MRI of the lumbar spine. EXAM: CT CHEST, ABDOMEN AND PELVIS WITHOUT CONTRAST TECHNIQUE: Multidetector CT imaging of the chest, abdomen and pelvis was performed following the standard protocol without IV contrast. COMPARISON:  Lumbar spine MRI 09/06/2015 and renal ultrasound 09/06/2015 FINDINGS: CT CHEST FINDINGS Mediastinum/Lymph Nodes: No chest wall mass, supraclavicular or axillary lymphadenopathy. Small scattered lymph nodes are noted. The thyroid gland is grossly normal. The heart is normal in size. No pericardial effusion. The aorta is normal in caliber. Mild tortuosity and scattered atherosclerotic calcifications. There are extensive 3 vessel coronary artery calcifications. Moderate distal esophageal wall thickening. Could not exclude neoplasm or esophagitis. No paraesophageal lymphadenopathy. Lungs/Pleura: Limited examination due to breathing motion artifact. There is a small left pleural effusion and areas of atelectasis. Rounded pleural density noted posteriorly at the right lung apex measures -45 Hounsfield units and is probably a small benign pleural lipoma. No worrisome pulmonary lesions to suggest a primary lung neoplasm or metastatic disease. Musculoskeletal: No significant bony findings. CT ABDOMEN PELVIS FINDINGS Hepatobiliary: No obvious hepatic lesions without contrast. Mild hepatomegaly. The gallbladder is distended and there are small gallstones noted. Pancreas: No pancreatic mass, inflammation or ductal dilatation. Spleen: Upper limits of normal.  No focal lesions. Adrenals/Urinary Tract: Bilateral adrenal gland masses. The right measures a maximum of 37.5 mm and the left 31 mm. No obvious renal mass. There is left-sided hydronephrosis due to a large  left-sided retroperitoneal mass which is surrounding the left ureter and lower pole region of the left kidney. I do not see a fat plane between this mass and the aorta. It extends down into the upper left pelvis. This could reflect aggressive lymphoma or retroperitoneal sarcoma. The mass measures approximately 16.4 x 9.7 x 8.7 cm. Recommend image guided biopsy. PET-CT may also be helpful for further evaluation and accurate staging. Stomach/Bowel: The stomach, duodenum, small bowel and colon are grossly normal. No inflammatory changes, mass lesions or obstructive findings. The terminal ileum is normal. Vascular/Lymphatic: Large left-sided retroperitoneal mass as discussed above. Moderate atherosclerotic calcifications involving  the aorta but no aneurysm. Reproductive: The bladder, prostate gland and seminal vesicles are unremarkable. Other: 3.6 x 2.1 cm internal iliac nodal mass on the left side. Small amount of free the pelvic fluid and presacral edema. Bilateral inguinal hernias containing fat and fluid and small bowel on the right side. Suspect undescended testicle in the left inguinal canal. This would raise the possibility of testicular cancer. No inguinal adenopathy. Musculoskeletal: No significant bony findings. IMPRESSION: 1. Bulky left-sided retroperitoneal mass which is obstructing the left ureter and causing left-sided hydronephrosis. There are also bilateral adrenal gland masses. Findings could be due to aggressive lymphoma, retroperitoneal sarcoma or possibly testicular carcinoma given the fact that it appears the patients left testicle is in the left inguinal canal. No obvious testicular mass. Scrotal ultrasound may be helpful for further evaluation. Image guided Biopsy is recommended of the left retroperitoneal mass. 2. No significant findings in the chest. No evidence of a primary lung neoplasm or metastatic pulmonary disease. No mediastinal or hilar mass or adenopathy. 3. Extensive 3 vessel coronary  artery calcifications. 4. Small left pleural effusion. 5. Distended gallbladder and cholelithiasis. 6. Bilateral inguinal hernias containing fat, fluid and small bowel on the right side. Electronically Signed   By: Marijo Sanes M.D.   On: 09/07/2015 13:35   Ct Chest Wo Contrast  09/07/2015  CLINICAL DATA:  Evaluate retroperitoneal mass seen on recent MRI of the lumbar spine. EXAM: CT CHEST, ABDOMEN AND PELVIS WITHOUT CONTRAST TECHNIQUE: Multidetector CT imaging of the chest, abdomen and pelvis was performed following the standard protocol without IV contrast. COMPARISON:  Lumbar spine MRI 09/06/2015 and renal ultrasound 09/06/2015 FINDINGS: CT CHEST FINDINGS Mediastinum/Lymph Nodes: No chest wall mass, supraclavicular or axillary lymphadenopathy. Small scattered lymph nodes are noted. The thyroid gland is grossly normal. The heart is normal in size. No pericardial effusion. The aorta is normal in caliber. Mild tortuosity and scattered atherosclerotic calcifications. There are extensive 3 vessel coronary artery calcifications. Moderate distal esophageal wall thickening. Could not exclude neoplasm or esophagitis. No paraesophageal lymphadenopathy. Lungs/Pleura: Limited examination due to breathing motion artifact. There is a small left pleural effusion and areas of atelectasis. Rounded pleural density noted posteriorly at the right lung apex measures -45 Hounsfield units and is probably a small benign pleural lipoma. No worrisome pulmonary lesions to suggest a primary lung neoplasm or metastatic disease. Musculoskeletal: No significant bony findings. CT ABDOMEN PELVIS FINDINGS Hepatobiliary: No obvious hepatic lesions without contrast. Mild hepatomegaly. The gallbladder is distended and there are small gallstones noted. Pancreas: No pancreatic mass, inflammation or ductal dilatation. Spleen: Upper limits of normal.  No focal lesions. Adrenals/Urinary Tract: Bilateral adrenal gland masses. The right measures a  maximum of 37.5 mm and the left 31 mm. No obvious renal mass. There is left-sided hydronephrosis due to a large left-sided retroperitoneal mass which is surrounding the left ureter and lower pole region of the left kidney. I do not see a fat plane between this mass and the aorta. It extends down into the upper left pelvis. This could reflect aggressive lymphoma or retroperitoneal sarcoma. The mass measures approximately 16.4 x 9.7 x 8.7 cm. Recommend image guided biopsy. PET-CT may also be helpful for further evaluation and accurate staging. Stomach/Bowel: The stomach, duodenum, small bowel and colon are grossly normal. No inflammatory changes, mass lesions or obstructive findings. The terminal ileum is normal. Vascular/Lymphatic: Large left-sided retroperitoneal mass as discussed above. Moderate atherosclerotic calcifications involving the aorta but no aneurysm. Reproductive: The bladder, prostate gland and seminal  vesicles are unremarkable. Other: 3.6 x 2.1 cm internal iliac nodal mass on the left side. Small amount of free the pelvic fluid and presacral edema. Bilateral inguinal hernias containing fat and fluid and small bowel on the right side. Suspect undescended testicle in the left inguinal canal. This would raise the possibility of testicular cancer. No inguinal adenopathy. Musculoskeletal: No significant bony findings. IMPRESSION: 1. Bulky left-sided retroperitoneal mass which is obstructing the left ureter and causing left-sided hydronephrosis. There are also bilateral adrenal gland masses. Findings could be due to aggressive lymphoma, retroperitoneal sarcoma or possibly testicular carcinoma given the fact that it appears the patients left testicle is in the left inguinal canal. No obvious testicular mass. Scrotal ultrasound may be helpful for further evaluation. Image guided Biopsy is recommended of the left retroperitoneal mass. 2. No significant findings in the chest. No evidence of a primary lung  neoplasm or metastatic pulmonary disease. No mediastinal or hilar mass or adenopathy. 3. Extensive 3 vessel coronary artery calcifications. 4. Small left pleural effusion. 5. Distended gallbladder and cholelithiasis. 6. Bilateral inguinal hernias containing fat, fluid and small bowel on the right side. Electronically Signed   By: Marijo Sanes M.D.   On: 09/07/2015 13:35   US Scrotum  09/07/2015  CLINICAL DATA:  64 year old male with palpable testicular mass. EXAM: ULTRASOUND OF SCROTUM TECHNIQUE: Complete ultrasound examination of the testicles, epididymis, and other scrotal structures was performed. COMPARISON:  None. FINDINGS: Right testicle Measurements: 4.5 x 2.6 x 2 cm. No mass or microlithiasis visualized. Left testicle Measurements: 4.4 x 3.4 x 2.9 cm. A 2.2 x 3.3 x 2.3 cm oval hypoechoic area within the left testicle is noted with some vascular flow. No other abnormalities identified. Right epididymis:  Not visualized Left epididymis:  Normal in size and appearance. Hydrocele:  Small - moderate bilateral hydroceles noted. Varicocele:  Mild left varicocele noted. IMPRESSION: 2.2 x 3.3 x 2.3 cm oval hypoechoic area within the left testicle suspicious for testicular mass/neoplasm. Urology consultation is recommended. Unremarkable right testicle. Small to moderate bilateral hydroceles. Electronically Signed   By: Margarette Canada M.D.   On: 09/07/2015 20:02   Ct Biopsy  09/08/2015  CLINICAL DATA:  Left retroperitoneal mass.  Left testicular mass. EXAM: CT-GUIDED BIOPSY LEFT RETROPERITONEAL MASS.  CORE. MEDICATIONS AND MEDICAL HISTORY: Versed 1 mg, Fentanyl 25 mcg. Additional Medications: None. ANESTHESIA/SEDATION: Moderate sedation time: 6 minutes PROCEDURE: The procedure, risks, benefits, and alternatives were explained to the patient. Questions regarding the procedure were encouraged and answered. The patient understands and consents to the procedure. The back was prepped with ChloraPrep in a sterile  fashion, and a sterile drape was applied covering the operative field. A sterile gown and sterile gloves were used for the procedure. Under CT guidance, a(n) 17 gauge guide needle was advanced into the left retroperitoneal mass. Subsequently 3 18 gauge core biopsies were obtained. The guide needle was removed. Final imaging was performed. Patient tolerated the procedure well without complication. Vital sign monitoring by nursing staff during the procedure will continue as patient is in the special procedures unit for post procedure observation. FINDINGS: The images document guide needle placement within the left retroperitoneal mass. Post biopsy images demonstrate no hemorrhage. COMPLICATIONS: None IMPRESSION: Successful CT-guided core biopsy of a left retroperitoneal mass. Electronically Signed   By: Marybelle Killings M.D.   On: 09/08/2015 16:50      Lab Results  Component Value Date   HGBA1C 6.2* 09/05/2015   HGBA1C 13.2* 12/22/2011   Lab Results  Component Value Date   CREATININE 3.14* 09/09/2015       Scheduled Meds: . aspirin  81 mg Oral Daily  . dexamethasone  4 mg Intravenous 4 times per day  . feeding supplement (GLUCERNA SHAKE)  237 mL Oral TID BM  . fluconazole (DIFLUCAN) IV  100 mg Intravenous Q24H  . guaiFENesin  1,200 mg Oral BID  . heparin  5,000 Units Subcutaneous 3 times per day  . insulin aspart  0-9 Units Subcutaneous TID WC  . pantoprazole (PROTONIX) IV  40 mg Intravenous Q24H  . piperacillin-tazobactam (ZOSYN)  IV  3.375 g Intravenous 3 times per day  . sodium chloride  3 mL Intravenous Q12H  . vancomycin  750 mg Intravenous Q24H   Continuous Infusions:    Principal Problem:   SIRS (systemic inflammatory response syndrome) (HCC) Active Problems:   Diabetes mellitus (HCC)   Weakness   Hypotension   Hyperkalemia   ARF (acute renal failure) (HCC)   Dehydration   Left sided numbness   H/O: CVA (cerebrovascular accident)   CAD (coronary artery disease)   Oral  thrush   Dysphagia   Anemia   Hyponatremia   Weakness of left side of body   Pelvic mass in male   Back pain    Time spent: 81 minutes   [facility] Pager [phone]. If 7PM-7AM, please contact night-coverage at [email or web address], password [facility] 09/09/2015, 11:21 AM  LOS: 4 days

## 2015-09-09 NOTE — Progress Notes (Signed)
Pt had 6 beat run of V tach. Pt asymptomatic. VSS. MD paged. New orders given. Will continue to monitor.  Raliegh Ip RN

## 2015-09-09 NOTE — Clinical Social Work Note (Addendum)
Clinical Social Work Assessment  Patient Details  Name: Carlos Black MRN: [id number] Date of Birth: 1951-02-18  Date of referral:  09/09/15               Reason for consult:  Facility Placement                Permission sought to share information with:  Family Supports, Chartered certified accountant granted to share information::  Yes, Verbal Permission Granted  Name::     McSwain,  Wife     Housing/Transportation Living arrangements for the past 2 months:  Single Family Home Source of Information:  Patient Patient Interpreter Needed:  None Criminal Activity/Legal Involvement Pertinent to Current Situation/Hospitalization:  No - Comment as needed Significant Relationships:  Spouse, Other Family Members, Adult Children Lives with:  Spouse Do you feel safe going back to the place where you live?   Yes Need for family participation in patient care:  Yes (Comment)  Care giving concerns:  "I have to have a procedure tomorrow and on Monday they are going to tell me the results of my biopsy. Not sure if I have cancer or not."  Social Worker assessment / plan:  CSW met with patient today for support and to discuss possible d/c options.  Physical Therapy recommends short term SNF and this is patient's preference if possible. Discussed possible barriers to CIR placement- his [employer] and whether he could tolerate extensive PT in CIR.  Patient states that he has many current unanswered questions about  His medical condition and is waiting to discuss further with his doctor.  Discussed possible SNF placement as another option to CIR and patient agrees to consider. He states that he would want to be placed in or near [place].  Patient states that he will await results of his biopsy on Monday and admits that he is very nervous about there results.  CSW services will update Fl2 with current condition once placement option is determined.  Patient indicated that if it  was determined that SNF care was indicated- he would like his first choice of faclity to be [facility] in [place].  Next choices would be [facility].    Employment status:  Disabled (Comment on whether or not currently receiving Disability), Retired Forensic scientist:  Managed Care PT Recommendations:  [facility] / Referral to community resources:  [facility]  Patient/Family's Response to care:  Patient is engaged and states that he feels the physicians are being very thorough in trying to find out what is actually wrong with him.    Patient/Family's Understanding of and Emotional Response to Diagnosis, Current Treatment, and Prognosis: Patient has a good understanding of his diagnosis but states that there are still many unanswered questions to his current medical condition. He states he is unable to look at treatment options or prognosis until his physicians tell him more about his medical condition.  Emotional Assessment Appearance:  Appears stated age Attitude/Demeanor/Rapport:   (Calm, Friendly, sad) Affect (typically observed):  Pleasant, Calm, Quiet, Sad Orientation:  Oriented to Self, Oriented to Place, Oriented to  Time, Oriented to Situation Alcohol / Substance use:  Never Used Psych involvement (Current and /or in the community):  No (Comment)  Discharge Needs  Concerns to be addressed:  Care Coordination, Adjustment to Illness Readmission within the last 30 days:  No Current discharge risk:  None Barriers to Discharge:  Continued Medical Work up   Kendell Bane T,  LCSW 09/09/2015, 4:32 PM

## 2015-09-10 LAB — GLUCOSE, CAPILLARY
GLUCOSE-CAPILLARY: 258 mg/dL — AB (ref 65–99)
GLUCOSE-CAPILLARY: 213 mg/dL — AB (ref 65–99)
Glucose-Capillary: 296 mg/dL — ABNORMAL HIGH (ref 65–99)
Glucose-Capillary: 272 mg/dL — ABNORMAL HIGH (ref 65–99)

## 2015-09-10 LAB — BASIC METABOLIC PANEL
ANION GAP: 15 (ref 5–15)
BUN: 45 mg/dL — ABNORMAL HIGH (ref 6–20)
CO2: 20 mmol/L — AB (ref 22–32)
Calcium: 8.1 mg/dL — ABNORMAL LOW (ref 8.9–10.3)
Chloride: 104 mmol/L (ref 101–111)
Creatinine, Ser: 2.93 mg/dL — ABNORMAL HIGH (ref 0.61–1.24)
GFR calc Af Amer: 24 mL/min — ABNORMAL LOW (ref >60)
GFR, EST NON AFRICAN AMERICAN: 21 mL/min — AB (ref >60)
GLUCOSE: 264 mg/dL — AB (ref 65–99)
POTASSIUM: 4.4 mmol/L (ref 3.5–5.1)
Sodium: 139 mmol/L (ref 135–145)

## 2015-09-10 LAB — CULTURE, BLOOD (ROUTINE X 2)
Culture: NO GROWTH
Culture: NO GROWTH

## 2015-09-10 MED ORDER — PANTOPRAZOLE SODIUM 40 MG PO TBEC
40.0000 mg | DELAYED_RELEASE_TABLET | Freq: Every day | ORAL | Status: DC
  Administered 2015-09-10 – 2015-09-13 (×4): 40 mg via ORAL
  Filled 2015-09-10 (×4): qty 1

## 2015-09-10 MED ORDER — FLUCONAZOLE 100 MG PO TABS
100.0000 mg | ORAL_TABLET | Freq: Every day | ORAL | Status: DC
  Administered 2015-09-11 – 2015-09-14 (×4): 100 mg via ORAL
  Filled 2015-09-10 (×4): qty 1

## 2015-09-10 NOTE — Progress Notes (Signed)
[facility] PROGRESS NOTE  Carlos Black OMV:[id number] DOB: 01/11/1951 DOA: 09/05/2015 PCP: [name], MD  Length of stay: 5   Assessment/Plan: Principal Problem:   SIRS (systemic inflammatory response syndrome) (HCC) Active Problems:   Diabetes mellitus (HCC)   Weakness   Hypotension   Hyperkalemia   ARF (acute renal failure) (HCC)   Dehydration   Left sided numbness   H/O: CVA (cerebrovascular accident)   CAD (coronary artery disease)   Oral thrush   Dysphagia   Anemia   Hyponatremia   Weakness of left side of body   Pelvic mass in male   Back pain    #1 Tumor fever vs UTI  Fever resolved, likely secondary to steroids. Patient on admission  noted to have a fever 101.4, tachycardic, tachypneic, and borderline hypotension. Chest x-ray negative for any acute infiltrates. Lactic acid level at 1.99. Patient does have a UTI  Blood culture negative, discontinue broad-spectrum antibiotics today, switched to Rocephin for UTI 2 more days Negative Panculture.   pro-calcitonin 0.86.   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   Patient with prior history of CVA risk factors of diabetes hypertension coronary artery disease.    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 Tried to insert Foley but failed, would appreciate if urology could place a Foley Discussed with urology about the patient's left retroperitoneal mass, [name] on 10/13. He  reviewed the MRI and stated that there was no evidence of extrinsic compression on the kidney resulting in renal failure based on the MRI However CT abdomen pelvis shows left-sided hydronephrosis Discussed with [name] on 10/14 . 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 No  need for intervention at this time per urology   Anemia In the setting of renal disease, possible neoplasia, hemoglobin stable around 10  Chronic kidney disease stage IV-creatinine of 3.14, GFR less than 30 likely his baseline, but some imrpovement>2.93  #4 hyperkalemia Resolved  #6  oral thrush Continue diflucan.  #7 dysphagia Consult speech therapy for swallow evaluation.    #8 hyponatremia Likely secondary to hypovolemic hyponatremia , improving  #9 diabetes mellitus   hemoglobin A1c 6.2 . Currently has steroid-induced hyperglycemia. Continue sliding scale insulin. Hold by mouth hypoglycemics  #10 history of CVA No acute CVA this admission  #11 hyperlipidemia Once tolerating oral intake will  resume home regimen of statin.  #12 hypertension Hold antihypertensives for now.  Follow.   DVT prophylaxsis heparin  Code Status:      Code Status Orders        Start     Ordered   09/05/15 1958  Do not attempt resuscitation (DNR)   Continuous    Question Answer Comment  In the event of cardiac or respiratory ARREST Do not call a "code blue"   In the event of cardiac or respiratory ARREST Do not perform Intubation, CPR, defibrillation or ACLS   In the event of cardiac or respiratory ARREST Use medication by any route, position, wound care, and other measures to relive pain and suffering. May use oxygen, suction and manual treatment of airway obstruction as needed for comfort.      09/05/15 1957     Family Communication: family updated about patient's clinical progress Disposition Plan: SNF versus CIR    Brief narrative: 64 y o with history of CVA with residual left sided weakness, DM, HTN, CAD who presented to ED with sudden onset of left lower extremity numbness and left-sided weakness on the day of admission. Patient stated that he woke up around 10 or 11 AM went to try to use the commode when he noticed a numbness in his left leg. Patient stated that he was extremely weak and unable to get off the commode and had to have 3 people help him up. Patient also endorses a cough or caring mostly at night over the past 2-3 weeks which is productive in nature with some brownish sputum. Patient denies any fevers, no chills, no nausea, no chest pain, no emesis, no shortness of breath, no abdominal pain, no diarrhea, no constipation, no dysuria, no melena, no hematemesis, no hematochezia, no visual changes, no facial droop, no slurred speech. Patient was in the emergency room CT head which was done was negative. Patient was noted in the ED to have systolic blood pressures in the 90s with a temp of 101.4 respiratory rate in the 30s and tachycardic with heart rate up to 109. Patient was given a liter bolus. Comprehensive metabolic profile obtained had a sodium of 129 potassium of  5.9 chloride of 93 BUN of 37 creatinine of 3.44 albumin of 3.0 AST of 50 bilirubin of 1.3 otherwise was within normal limits. Lactic acid level was 1.99. CBC had a hemoglobin of 11.2 otherwise was within normal limits. Chest x-ray which was done was negative for any acute infiltrate. Urinalysis was pending. Blood cultures were drawn. Patient was given a dose of IV vancomycin and IV Zosyn. [facility] were called to admit the patient for further evaluation and management.  Consultants:  None   Procedures None  Antibiotics: Anti-infectives    Start     Dose/Rate Route Frequency Ordered Stop   09/09/15 1130  cefTRIAXone (ROCEPHIN) 1 g in dextrose 5 % 50 mL IVPB     1 g 100 mL/hr over 30 Minutes Intravenous Every 24 hours 09/09/15 1123     09/07/15 1800  vancomycin (VANCOCIN) IVPB 750 mg/150 ml premix  Status:  Discontinued     750 mg 150 mL/hr over 60 Minutes Intravenous Every 24 hours 09/07/15 1141 09/09/15 1123   09/07/15 1730  vancomycin (VANCOCIN) IVPB 1000 mg/200 mL premix  Status:  Discontinued     1,000 mg 200 mL/hr over 60 Minutes Intravenous Every 48 hours 09/05/15 1712 09/07/15 1141   09/06/15 2200  piperacillin-tazobactam (ZOSYN) IVPB 3.375 g  Status:  Discontinued     3.375 g 12.5 mL/hr over 240 Minutes Intravenous 3 times per day 09/06/15 1304 09/09/15 1123   09/06/15 1000  fluconazole (DIFLUCAN) IVPB 100 mg     100 mg 50 mL/hr over 60 Minutes Intravenous Every 24 hours 09/05/15 1947     09/05/15 2200  fluconazole (DIFLUCAN) IVPB 200 mg     200 mg 100 mL/hr over 60 Minutes Intravenous  Once 09/05/15 1946 09/05/15 2316   09/05/15 1800  piperacillin-tazobactam (ZOSYN) IVPB 3.375 g  Status:  Discontinued     3.375 g 100 mL/hr over 30 Minutes Intravenous  Once 09/05/15 1758 09/05/15 1800   09/05/15 1800  vancomycin (VANCOCIN) IVPB 1000 mg/200 mL premix  Status:  Discontinued     1,000 mg 200 mL/hr over 60 Minutes Intravenous  Once 09/05/15 1758 09/05/15 1759    09/05/15 1730  piperacillin-tazobactam (ZOSYN) IVPB 2.25 g  Status:  Discontinued     2.25 g 100 mL/hr over 30 Minutes Intravenous 4 times per day 09/05/15 1712 09/06/15 1304   09/05/15 1730  vancomycin (VANCOCIN) IVPB 1000 mg/200 mL premix  Status:  Discontinued     1,000 mg 200 mL/hr over 60 Minutes Intravenous  Once 09/05/15 1712 09/05/15 1715   09/05/15 1730  vancomycin (VANCOCIN) 1,500 mg in sodium chloride 0.9 % 500 mL IVPB     1,500 mg 250 mL/hr over 120 Minutes Intravenous  Once 09/05/15 1715 09/05/15 2026         HPI/Subjective: Denies any complaints, no sob, no cp   Objective: Filed Vitals:   09/09/15 0502 09/09/15 1526 09/09/15 2032 09/10/15 0338  BP: 119/80 112/71 129/88 125/84  Pulse: 84 79 86 80  Temp: 97.5 F (36.4 C) 97.6 F (36.4 C) 97.5 F (36.4 C) 97.5 F (36.4 C)  TempSrc: Oral Oral Oral Oral  Resp: 18 17 17 17   Height:      Weight: 74.072 kg (163 lb 4.8 oz)     SpO2: 93% 94% 96% 93%    Intake/Output Summary (Last 24 hours) at 09/10/15 0941 Last data filed at 09/10/15 0834  Gross per 24 hour  Intake    360 ml  Output    300 ml  Net     60 ml    Exam:  General: No acute respiratory distress Lungs: Clear to auscultation bilaterally without wheezes or crackles Cardiovascular: Regular rate and rhythm without murmur gallop or rub normal S1 and S2 Abdomen: Nontender, nondistended, soft, bowel sounds positive, no rebound, no ascites, no appreciable mass Extremities: No significant cyanosis, clubbing, or edema bilateral lower extremities     Data Review   Micro Results Recent Results (from the past 240 hour(s))  Blood culture (routine x 2)     Status: None (Preliminary result)   Collection Time: 09/05/15  3:00 PM  Result Value Ref Range Status   Specimen Description BLOOD RIGHT ARM  Final   Special Requests BOTTLES DRAWN AEROBIC AND ANAEROBIC 5CC  Final   Culture NO GROWTH 4 DAYS  Final   Report Status PENDING  Incomplete  Blood culture  (routine x 2)     Status: None (Preliminary result)   Collection Time: 09/05/15  4:43 PM  Result Value Ref Range Status   Specimen Description BLOOD LEFT HAND  Final   Special Requests BOTTLES DRAWN AEROBIC AND ANAEROBIC 4CC  Final   Culture NO GROWTH 4 DAYS  Final   Report Status PENDING  Incomplete  MRSA PCR Screening  Status: None   Collection Time: 09/05/15  8:28 PM  Result Value Ref Range Status   MRSA by PCR NEGATIVE NEGATIVE Final    Comment:        The GeneXpert MRSA Assay (FDA approved for NASAL specimens only), is one component of a comprehensive MRSA colonization surveillance program. It is not intended to diagnose MRSA infection nor to guide or monitor treatment for MRSA infections.   Urine culture     Status: None   Collection Time: 09/05/15 10:20 PM  Result Value Ref Range Status   Specimen Description URINE, RANDOM  Final   Special Requests NONE  Final   Culture 3,000 COLONIES/mL INSIGNIFICANT GROWTH  Final   Report Status 09/07/2015 FINAL  Final  Culture, blood (routine x 2)     Status: None (Preliminary result)   Collection Time: 09/07/15  9:17 AM  Result Value Ref Range Status   Specimen Description BLOOD RIGHT ANTECUBITAL  Final   Special Requests BOTTLES DRAWN AEROBIC AND ANAEROBIC 5CC 10CC  Final   Culture NO GROWTH 2 DAYS  Final   Report Status PENDING  Incomplete  Culture, blood (routine x 2)     Status: None (Preliminary result)   Collection Time: 09/07/15  9:27 AM  Result Value Ref Range Status   Specimen Description BLOOD RIGHT HAND  Final   Special Requests BOTTLES DRAWN AEROBIC AND ANAEROBIC 10CC  Final   Culture NO GROWTH 2 DAYS  Final   Report Status PENDING  Incomplete    Radiology Reports Ct Abdomen Pelvis Wo Contrast  09/07/2015  CLINICAL DATA:  Evaluate retroperitoneal mass seen on recent MRI of the lumbar spine. EXAM: CT CHEST, ABDOMEN AND PELVIS WITHOUT CONTRAST TECHNIQUE: Multidetector CT imaging of the chest, abdomen and pelvis  was performed following the standard protocol without IV contrast. COMPARISON:  Lumbar spine MRI 09/06/2015 and renal ultrasound 09/06/2015 FINDINGS: CT CHEST FINDINGS Mediastinum/Lymph Nodes: No chest wall mass, supraclavicular or axillary lymphadenopathy. Small scattered lymph nodes are noted. The thyroid gland is grossly normal. The heart is normal in size. No pericardial effusion. The aorta is normal in caliber. Mild tortuosity and scattered atherosclerotic calcifications. There are extensive 3 vessel coronary artery calcifications. Moderate distal esophageal wall thickening. Could not exclude neoplasm or esophagitis. No paraesophageal lymphadenopathy. Lungs/Pleura: Limited examination due to breathing motion artifact. There is a small left pleural effusion and areas of atelectasis. Rounded pleural density noted posteriorly at the right lung apex measures -45 Hounsfield units and is probably a small benign pleural lipoma. No worrisome pulmonary lesions to suggest a primary lung neoplasm or metastatic disease. Musculoskeletal: No significant bony findings. CT ABDOMEN PELVIS FINDINGS Hepatobiliary: No obvious hepatic lesions without contrast. Mild hepatomegaly. The gallbladder is distended and there are small gallstones noted. Pancreas: No pancreatic mass, inflammation or ductal dilatation. Spleen: Upper limits of normal.  No focal lesions. Adrenals/Urinary Tract: Bilateral adrenal gland masses. The right measures a maximum of 37.5 mm and the left 31 mm. No obvious renal mass. There is left-sided hydronephrosis due to a large left-sided retroperitoneal mass which is surrounding the left ureter and lower pole region of the left kidney. I do not see a fat plane between this mass and the aorta. It extends down into the upper left pelvis. This could reflect aggressive lymphoma or retroperitoneal sarcoma. The mass measures approximately 16.4 x 9.7 x 8.7 cm. Recommend image guided biopsy. PET-CT may also be helpful for  further evaluation and accurate staging. Stomach/Bowel: The stomach, duodenum, small bowel  and colon are grossly normal. No inflammatory changes, mass lesions or obstructive findings. The terminal ileum is normal. Vascular/Lymphatic: Large left-sided retroperitoneal mass as discussed above. Moderate atherosclerotic calcifications involving the aorta but no aneurysm. Reproductive: The bladder, prostate gland and seminal vesicles are unremarkable. Other: 3.6 x 2.1 cm internal iliac nodal mass on the left side. Small amount of free the pelvic fluid and presacral edema. Bilateral inguinal hernias containing fat and fluid and small bowel on the right side. Suspect undescended testicle in the left inguinal canal. This would raise the possibility of testicular cancer. No inguinal adenopathy. Musculoskeletal: No significant bony findings. IMPRESSION: 1. Bulky left-sided retroperitoneal mass which is obstructing the left ureter and causing left-sided hydronephrosis. There are also bilateral adrenal gland masses. Findings could be due to aggressive lymphoma, retroperitoneal sarcoma or possibly testicular carcinoma given the fact that it appears the patients left testicle is in the left inguinal canal. No obvious testicular mass. Scrotal ultrasound may be helpful for further evaluation. Image guided Biopsy is recommended of the left retroperitoneal mass. 2. No significant findings in the chest. No evidence of a primary lung neoplasm or metastatic pulmonary disease. No mediastinal or hilar mass or adenopathy. 3. Extensive 3 vessel coronary artery calcifications. 4. Small left pleural effusion. 5. Distended gallbladder and cholelithiasis. 6. Bilateral inguinal hernias containing fat, fluid and small bowel on the right side. Electronically Signed   By: Marijo Sanes M.D.   On: 09/07/2015 13:35   Dg Chest 2 View  09/05/2015  CLINICAL DATA:  64 year old with acute onset of left lower extremity paresis/numbness which began  earlier today. Fever. Current history of hypertension and diabetes. Prior stroke and MI. EXAM: CHEST  2 VIEW COMPARISON:  07/26/2004, 07/20/2004. FINDINGS: AP semi-erect and lateral images were obtained. Chronic elevation of the right hemidiaphragm with chronic scar/atelectasis involving the right lower lobe and right middle lobe, unchanged. Lungs otherwise clear. No localized airspace consolidation. No pleural effusions. No pneumothorax. Normal pulmonary vascularity. Cardiac silhouette upper normal in size for AP technique. Thoracic aorta mildly atherosclerotic and tortuous, unchanged. Hilar and mediastinal contours otherwise unremarkable. Visualized bony thorax intact with only mild degenerative changes in the mid thoracic region. IMPRESSION: 1.  No acute cardiopulmonary disease. 2. Stable chronic elevation of the right hemidiaphragm and chronic scar/atelectasis involving the right lower lobe and right middle lobe. Electronically Signed   By: Evangeline Dakin M.D.   On: 09/05/2015 15:26   Ct Head Wo Contrast  09/05/2015  CLINICAL DATA:  Left-sided weakness from previous CVA, weakness has increased over the last day with difficulty walking EXAM: CT HEAD WITHOUT CONTRAST TECHNIQUE: Contiguous axial images were obtained from the base of the skull through the vertex without intravenous contrast. COMPARISON:  12/21/2011 FINDINGS: The bony calvarium is intact. Atrophic changes are noted. No findings to suggest acute hemorrhage, acute infarction or space-occupying mass lesion are noted. IMPRESSION: Atrophic changes without acute abnormality. Electronically Signed   By: Inez Catalina M.D.   On: 09/05/2015 16:37   Ct Chest Wo Contrast  09/07/2015  CLINICAL DATA:  Evaluate retroperitoneal mass seen on recent MRI of the lumbar spine. EXAM: CT CHEST, ABDOMEN AND PELVIS WITHOUT CONTRAST TECHNIQUE: Multidetector CT imaging of the chest, abdomen and pelvis was performed following the standard protocol without IV contrast.  COMPARISON:  Lumbar spine MRI 09/06/2015 and renal ultrasound 09/06/2015 FINDINGS: CT CHEST FINDINGS Mediastinum/Lymph Nodes: No chest wall mass, supraclavicular or axillary lymphadenopathy. Small scattered lymph nodes are noted. The thyroid gland is grossly normal. The  heart is normal in size. No pericardial effusion. The aorta is normal in caliber. Mild tortuosity and scattered atherosclerotic calcifications. There are extensive 3 vessel coronary artery calcifications. Moderate distal esophageal wall thickening. Could not exclude neoplasm or esophagitis. No paraesophageal lymphadenopathy. Lungs/Pleura: Limited examination due to breathing motion artifact. There is a small left pleural effusion and areas of atelectasis. Rounded pleural density noted posteriorly at the right lung apex measures -45 Hounsfield units and is probably a small benign pleural lipoma. No worrisome pulmonary lesions to suggest a primary lung neoplasm or metastatic disease. Musculoskeletal: No significant bony findings. CT ABDOMEN PELVIS FINDINGS Hepatobiliary: No obvious hepatic lesions without contrast. Mild hepatomegaly. The gallbladder is distended and there are small gallstones noted. Pancreas: No pancreatic mass, inflammation or ductal dilatation. Spleen: Upper limits of normal.  No focal lesions. Adrenals/Urinary Tract: Bilateral adrenal gland masses. The right measures a maximum of 37.5 mm and the left 31 mm. No obvious renal mass. There is left-sided hydronephrosis due to a large left-sided retroperitoneal mass which is surrounding the left ureter and lower pole region of the left kidney. I do not see a fat plane between this mass and the aorta. It extends down into the upper left pelvis. This could reflect aggressive lymphoma or retroperitoneal sarcoma. The mass measures approximately 16.4 x 9.7 x 8.7 cm. Recommend image guided biopsy. PET-CT may also be helpful for further evaluation and accurate staging. Stomach/Bowel: The  stomach, duodenum, small bowel and colon are grossly normal. No inflammatory changes, mass lesions or obstructive findings. The terminal ileum is normal. Vascular/Lymphatic: Large left-sided retroperitoneal mass as discussed above. Moderate atherosclerotic calcifications involving the aorta but no aneurysm. Reproductive: The bladder, prostate gland and seminal vesicles are unremarkable. Other: 3.6 x 2.1 cm internal iliac nodal mass on the left side. Small amount of free the pelvic fluid and presacral edema. Bilateral inguinal hernias containing fat and fluid and small bowel on the right side. Suspect undescended testicle in the left inguinal canal. This would raise the possibility of testicular cancer. No inguinal adenopathy. Musculoskeletal: No significant bony findings. IMPRESSION: 1. Bulky left-sided retroperitoneal mass which is obstructing the left ureter and causing left-sided hydronephrosis. There are also bilateral adrenal gland masses. Findings could be due to aggressive lymphoma, retroperitoneal sarcoma or possibly testicular carcinoma given the fact that it appears the patients left testicle is in the left inguinal canal. No obvious testicular mass. Scrotal ultrasound may be helpful for further evaluation. Image guided Biopsy is recommended of the left retroperitoneal mass. 2. No significant findings in the chest. No evidence of a primary lung neoplasm or metastatic pulmonary disease. No mediastinal or hilar mass or adenopathy. 3. Extensive 3 vessel coronary artery calcifications. 4. Small left pleural effusion. 5. Distended gallbladder and cholelithiasis. 6. Bilateral inguinal hernias containing fat, fluid and small bowel on the right side. Electronically Signed   By: Marijo Sanes M.D.   On: 09/07/2015 13:35   Mr Brain Wo Contrast  09/06/2015  CLINICAL DATA:  LEFT lower extremity numbness and LEFT-sided weakness for 2 days. Night cough for 2-3 weeks. History of stroke and LEFT-sided weakness,  diabetes, hypertension, coronary artery disease. EXAM: MRI HEAD WITHOUT CONTRAST TECHNIQUE: Multiplanar, multiecho pulse sequences of the brain and surrounding structures were obtained without intravenous contrast. COMPARISON:  CT head September 05, 2015 at 1626 hours FINDINGS: Multiple sequences are mild or moderately motion degraded. No reduced diffusion to suggest acute ischemia. No susceptibility artifact to suggest hemorrhage. Ventricles and sulci are normal for patient's age.  Old RIGHT ventral pons infarct. Old RIGHT basal ganglia lacunar infarct. Mild RIGHT cerebral peduncle volume loss compatible with wallerian degeneration. Mild white matter changes compatible with chronic small vessel ischemic disease, decreased sensitivity due to patient motion. No abnormal extra-axial fluid collections. Dolicoectatic intracranial vessel flow voids seen at the skull base. Ocular globes and orbital contents are grossly normal though motion degraded. Paranasal sinuses and mastoid air cells are well aerated. No abnormal sellar expansion. No is abnormal cerebellar tonsillar descent below the foramen magnum. No suspicious bone marrow signal. IMPRESSION: No acute intracranial process on this motion degraded examination. Involutional changes and mild chronic small vessel ischemic disease. Old RIGHT pontine infarct. Old RIGHT basal ganglia lacunar infarct. Electronically Signed   By: Elon Alas M.D.   On: 09/06/2015 02:55   Mr Lumbar Spine Wo Contrast  09/06/2015  CLINICAL DATA:  LEFT lower extremity numbness and LEFT-sided weakness for 2 days. History of stroke in LEFT-sided weakness, diabetes, hypertension, coronary artery disease. EXAM: MRI LUMBAR SPINE WITHOUT CONTRAST TECHNIQUE: Multiplanar, multisequence MR imaging of the lumbar spine was performed. No intravenous contrast was administered. COMPARISON:  None. FINDINGS: Lumbar vertebral bodies and posterior elements are intact and aligned with maintenance of lumbar  lordosis. Intervertebral discs demonstrate normal morphology, with slight decreased T2 signal within the lower lumbar disc consistent with mild desiccation. Minimal subacute discogenic endplate changes at all lumbar levels, no STIR signal abnormality to suggest acute osseous process. Conus medullaris terminates at L1 and appears normal in morphology and signal characteristics. Limited assessment of cauda equina due to patient motion. LEFT abdominal mass partially imaged, with mass effect on the LEFT psoas muscle. Probable lymphadenopathy along LEFT Common iliac chain. Probable obstructive uropathy on the LEFT. Partially imaged sub cm gallstone. Level by level evaluation: L1-2 and L2-3: No significant disc bulge, canal stenosis or neural foraminal narrowing. L3-4: Small broad-based disc bulge, mild facet and ligamentum flavum redundancy without canal stenosis. Minimal bilateral neural foraminal narrowing. L4-5: Small broad-based disc bulge asymmetric to LEFT. Mild facet arthropathy and ligamentum flavum redundancy without canal stenosis. Minimal LEFT neural foraminal narrowing. L5-S1: No disc bulge, canal stenosis nor neural foraminal narrowing. IMPRESSION: No acute lumbar spine fracture nor malalignment. No canal stenosis. Minimal L3-4 and L4-5 neural foraminal narrowing. Large LEFT retroperitoneal mass with mass effect on the LEFT psoas muscle and probable lymphadenopathy along LEFT iliac chain. Recommend CT of the abdomen and pelvis with contrast. Mass appears to result in LEFT obstructive uropathy. Electronically Signed   By: Elon Alas M.D.   On: 09/06/2015 23:18   US Scrotum  09/07/2015  CLINICAL DATA:  64 year old male with palpable testicular mass. EXAM: ULTRASOUND OF SCROTUM TECHNIQUE: Complete ultrasound examination of the testicles, epididymis, and other scrotal structures was performed. COMPARISON:  None. FINDINGS: Right testicle Measurements: 4.5 x 2.6 x 2 cm. No mass or microlithiasis  visualized. Left testicle Measurements: 4.4 x 3.4 x 2.9 cm. A 2.2 x 3.3 x 2.3 cm oval hypoechoic area within the left testicle is noted with some vascular flow. No other abnormalities identified. Right epididymis:  Not visualized Left epididymis:  Normal in size and appearance. Hydrocele:  Small - moderate bilateral hydroceles noted. Varicocele:  Mild left varicocele noted. IMPRESSION: 2.2 x 3.3 x 2.3 cm oval hypoechoic area within the left testicle suspicious for testicular mass/neoplasm. Urology consultation is recommended. Unremarkable right testicle. Small to moderate bilateral hydroceles. Electronically Signed   By: Margarette Canada M.D.   On: 09/07/2015 20:02   US Renal  09/06/2015  CLINICAL DATA:  Acute renal failure. EXAM: RENAL / URINARY TRACT ULTRASOUND COMPLETE COMPARISON:  None. FINDINGS: Right Kidney: Length: 9.6 cm. Heterogeneous increased parenchymal echotexture with parenchymal thinning suggesting chronic medical renal disease. No hydronephrosis or solid mass. Left Kidney: Length: 9.8 cm. Heterogeneous increased parenchymal echotexture likely indicating chronic medical renal disease. Appears to be circumscribed hypoechoic appearance of the lower pole of the left kidney. This may indicate edema versus abscess. No hydronephrosis or solid mass identified. Bladder: No bladder wall thickening or filling defect. Bilateral urine flow jets are demonstrated on color flow Doppler imaging. Incidental note of mildly enlarged prostate gland measuring 3.6 x 3.6 x 3.3 cm. IMPRESSION: Heterogeneous parenchymal echotexture bilaterally suggesting chronic medical renal disease. Hypoechoic appearance of the lower pole left kidney could indicate edema, pyelonephritis, or abscess. No hydronephrosis in either kidney. Mild prostate enlargement. Electronically Signed   By: Lucienne Capers M.D.   On: 09/06/2015 03:19   Ct Biopsy  09/08/2015  CLINICAL DATA:  Left retroperitoneal mass.  Left testicular mass. EXAM: CT-GUIDED  BIOPSY LEFT RETROPERITONEAL MASS.  CORE. MEDICATIONS AND MEDICAL HISTORY: Versed 1 mg, Fentanyl 25 mcg. Additional Medications: None. ANESTHESIA/SEDATION: Moderate sedation time: 6 minutes PROCEDURE: The procedure, risks, benefits, and alternatives were explained to the patient. Questions regarding the procedure were encouraged and answered. The patient understands and consents to the procedure. The back was prepped with ChloraPrep in a sterile fashion, and a sterile drape was applied covering the operative field. A sterile gown and sterile gloves were used for the procedure. Under CT guidance, a(n) 17 gauge guide needle was advanced into the left retroperitoneal mass. Subsequently 3 18 gauge core biopsies were obtained. The guide needle was removed. Final imaging was performed. Patient tolerated the procedure well without complication. Vital sign monitoring by nursing staff during the procedure will continue as patient is in the special procedures unit for post procedure observation. FINDINGS: The images document guide needle placement within the left retroperitoneal mass. Post biopsy images demonstrate no hemorrhage. COMPLICATIONS: None IMPRESSION: Successful CT-guided core biopsy of a left retroperitoneal mass. Electronically Signed   By: Marybelle Killings M.D.   On: 09/08/2015 16:50   Portable Chest 1 View  09/06/2015  CLINICAL DATA:  Fever. EXAM: PORTABLE CHEST 1 VIEW COMPARISON:  09/05/2015.  07/20/2004. FINDINGS: Mediastinum and hilar structures are normal. Heart size stable. Persistent right lower lobe subsegmental atelectasis and/or scarring. No pleural effusion pneumothorax. IMPRESSION: Stable right base subsegmental atelectasis and/or scarring. No acute cardiopulmonary disease. Electronically Signed   By: Marcello Moores  Register   On: 09/06/2015 07:19     CBC  Recent Labs Lab 09/05/15 1500 09/06/15 0242 09/07/15 0917 09/08/15 0404 09/09/15 0353  WBC 5.9 4.2 4.3 3.8* 5.8  HGB 11.2* 9.8* 9.3* 9.2*  10.0*  HCT 34.3* 30.7* 28.6* 28.1* 30.6*  PLT 267 205 180 189 229  MCV 75.9* 77.3* 77.3* 77.0* 76.3*  MCH 24.8* 24.7* 25.1* 25.2* 24.9*  MCHC 32.7 31.9 32.5 32.7 32.7  RDW 16.1* 16.2* 16.5* 16.6* 16.7*  LYMPHSABS 0.7  --  0.9  --   --   MONOABS 1.6*  --  0.8  --   --   EOSABS 0.1  --  0.2  --   --   BASOSABS 0.0  --  0.0  --   --     Chemistries   Recent Labs Lab 09/05/15 1500 09/05/15 1837 09/05/15 2043 09/06/15 0242 09/07/15 0917 09/08/15 0404 09/09/15 0353 09/09/15 2148 09/10/15 0427  NA 129*  --  132* 135 135 133* 138  --  139  K 5.9*  --  5.0 5.0 5.2* 4.3 4.3  --  4.4  CL 93*  --  98* 100* 105 98* 102  --  104  CO2 25  --  23 20* 21* 22 24  --  20*  GLUCOSE 150*  --  117* 116* 103* 225* 248*  --  264*  BUN 37*  --  32* 31* 24* 30* 38*  --  45*  CREATININE 3.44*  --  3.24* 3.14* 3.01* 3.19* 3.14*  --  2.93*  CALCIUM 9.2  --  8.2* 8.4* 8.2* 7.8* 8.3*  --  8.1*  MG  --  1.6* 1.8 2.3  --   --   --  2.0  --   AST 50*  --   --  34 41 35 25  --   --   ALT 21  --   --  19 20 22 18   --   --   ALKPHOS 79  --   --  61 64 63 54  --   --   BILITOT 1.3*  --   --  1.0 1.0 0.8 0.7  --   --    ------------------------------------------------------------------------------------------------------------------ estimated creatinine clearance is 23.6 mL/min (by C-G formula based on Cr of 2.93). ------------------------------------------------------------------------------------------------------------------ No results for input(s): HGBA1C in the last 72 hours. ------------------------------------------------------------------------------------------------------------------ No results for input(s): CHOL, HDL, LDLCALC, TRIG, CHOLHDL, LDLDIRECT in the last 72 hours. ------------------------------------------------------------------------------------------------------------------ No results for input(s): TSH, T4TOTAL, T3FREE, THYROIDAB in the last 72 hours.  Invalid input(s):  FREET3 ------------------------------------------------------------------------------------------------------------------  Recent Labs  09/07/15 1840  FERRITIN 766*    Coagulation profile  Recent Labs Lab 09/05/15 1500  INR 1.26    No results for input(s): DDIMER in the last 72 hours.  Cardiac Enzymes  Recent Labs Lab 09/05/15 1837  TROPONINI <0.03   ------------------------------------------------------------------------------------------------------------------ Invalid input(s): POCBNP   CBG:  Recent Labs Lab 09/09/15 0625 09/09/15 1118 09/09/15 1633 09/09/15 2110 09/10/15 0602  GLUCAP 224* 269* 270* 293* 258*       Studies: Ct Biopsy  09/08/2015  CLINICAL DATA:  Left retroperitoneal mass.  Left testicular mass. EXAM: CT-GUIDED BIOPSY LEFT RETROPERITONEAL MASS.  CORE. MEDICATIONS AND MEDICAL HISTORY: Versed 1 mg, Fentanyl 25 mcg. Additional Medications: None. ANESTHESIA/SEDATION: Moderate sedation time: 6 minutes PROCEDURE: The procedure, risks, benefits, and alternatives were explained to the patient. Questions regarding the procedure were encouraged and answered. The patient understands and consents to the procedure. The back was prepped with ChloraPrep in a sterile fashion, and a sterile drape was applied covering the operative field. A sterile gown and sterile gloves were used for the procedure. Under CT guidance, a(n) 17 gauge guide needle was advanced into the left retroperitoneal mass. Subsequently 3 18 gauge core biopsies were obtained. The guide needle was removed. Final imaging was performed. Patient tolerated the procedure well without complication. Vital sign monitoring by nursing staff during the procedure will continue as patient is in the special procedures unit for post procedure observation. FINDINGS: The images document guide needle placement within the left retroperitoneal mass. Post biopsy images demonstrate no hemorrhage. COMPLICATIONS: None  IMPRESSION: Successful CT-guided core biopsy of a left retroperitoneal mass. Electronically Signed   By: Marybelle Killings M.D.   On: 09/08/2015 16:50      Lab Results  Component Value Date   HGBA1C 6.2* 09/05/2015   HGBA1C 13.2* 12/22/2011   Lab Results  Component Value Date   CREATININE  2.93* 09/10/2015       Scheduled Meds: . aspirin  81 mg Oral Daily  . cefTRIAXone (ROCEPHIN)  IV  1 g Intravenous Q24H  . dexamethasone  4 mg Intravenous 4 times per day  . feeding supplement (GLUCERNA SHAKE)  237 mL Oral TID BM  . fluconazole (DIFLUCAN) IV  100 mg Intravenous Q24H  . guaiFENesin  1,200 mg Oral BID  . heparin  5,000 Units Subcutaneous 3 times per day  . insulin aspart  0-9 Units Subcutaneous TID WC  . pantoprazole (PROTONIX) IV  40 mg Intravenous Q24H  . sodium chloride  3 mL Intravenous Q12H   Continuous Infusions: . sodium chloride 75 mL/hr at 09/09/15 1201    Principal Problem:   SIRS (systemic inflammatory response syndrome) (HCC) Active Problems:   Diabetes mellitus (HCC)   Weakness   Hypotension   Hyperkalemia   ARF (acute renal failure) (HCC)   Dehydration   Left sided numbness   H/O: CVA (cerebrovascular accident)   CAD (coronary artery disease)   Oral thrush   Dysphagia   Anemia   Hyponatremia   Weakness of left side of body   Pelvic mass in male   Back pain    Time spent: 45 minutes   [facility] Pager [phone]. If 7PM-7AM, please contact night-coverage at [email or web address], password [facility] 09/10/2015, 9:41 AM  LOS: 5 days

## 2015-09-11 ENCOUNTER — Encounter (HOSPITAL_COMMUNITY)
Admission: EM | Disposition: A | Discharge status: Skilled Nursing Facility | Payer: Self-pay | Source: Home / Self Care | Attending: Internal Medicine

## 2015-09-11 ENCOUNTER — Inpatient Hospital Stay (HOSPITAL_COMMUNITY): Payer: Medicare HMO

## 2015-09-11 DIAGNOSIS — D508 Other iron deficiency anemias: Secondary | ICD-10-CM

## 2015-09-11 DIAGNOSIS — I4892 Unspecified atrial flutter: Secondary | ICD-10-CM

## 2015-09-11 LAB — BASIC METABOLIC PANEL
ANION GAP: 10 (ref 5–15)
BUN: 45 mg/dL — ABNORMAL HIGH (ref 6–20)
CALCIUM: 7.5 mg/dL — AB (ref 8.9–10.3)
CHLORIDE: 104 mmol/L (ref 101–111)
CO2: 23 mmol/L (ref 22–32)
Creatinine, Ser: 2.52 mg/dL — ABNORMAL HIGH (ref 0.61–1.24)
GFR calc non Af Amer: 25 mL/min — ABNORMAL LOW (ref >60)
GFR, EST AFRICAN AMERICAN: 29 mL/min — AB (ref >60)
Glucose, Bld: 243 mg/dL — ABNORMAL HIGH (ref 65–99)
POTASSIUM: 4.6 mmol/L (ref 3.5–5.1)
Sodium: 137 mmol/L (ref 135–145)

## 2015-09-11 LAB — GLUCOSE, CAPILLARY
GLUCOSE-CAPILLARY: 208 mg/dL — AB (ref 65–99)
GLUCOSE-CAPILLARY: 304 mg/dL — AB (ref 65–99)
Glucose-Capillary: 348 mg/dL — ABNORMAL HIGH (ref 65–99)
Glucose-Capillary: 267 mg/dL — ABNORMAL HIGH (ref 65–99)

## 2015-09-11 SURGERY — ORCHIECTOMY
Anesthesia: General

## 2015-09-11 MED ORDER — DEXAMETHASONE 1 MG/ML PO CONC
4.0000 mg | Freq: Two times a day (BID) | ORAL | Status: DC
  Administered 2015-09-11 – 2015-09-13 (×4): 4 mg via ORAL
  Filled 2015-09-11 (×8): qty 4

## 2015-09-11 NOTE — Progress Notes (Signed)
Physical Therapy Treatment Patient Details Name: Carlos Black MRN: [id number] DOB: [birth year] Today's Date: 09/11/2015    History of Present Illness 64 y o with history of CVA with residual left sided weakness, DM, HTN, CAD who presented to ED with sudden onset of left lower extremity numbness and left-sided weakness on the day of admission. Patient stated that he was extremely weak and unable to get off the commode and had to have 3 people help him up.    PT Comments    Patient progressing slowly towards PT goals. Continues to have difficulty with standing from low surfaces requiring max cues and assist. Fatigues quickly during gait training. Gait improved when pt wearing AFO and shoes. Requires assist with LLE advancement and unfamiliar with using RW for support. Pt plans to d/c to rehab today. Will follow acutely per current POC.   Follow Up Recommendations  Supervision/Assistance - 24 hour;SNF     Equipment Recommendations  Other (comment)    Recommendations for Other Services       Precautions / Restrictions Precautions Precautions: Fall Precaution Comments: left hemiparesis from previous CVA Restrictions Weight Bearing Restrictions: No    Mobility  Bed Mobility               General bed mobility comments: Sitting in chair upon PT arrival.   Transfers Overall transfer level: Needs assistance Equipment used: Rolling walker (2 wheeled) Transfers: Sit to/from Stand Sit to Stand: Max assist;+2 physical assistance         General transfer comment: Max A to boost from chair with cues for hand placement/technique. CUes for use of body momentum to rise to standing.  Ambulation/Gait Ambulation/Gait assistance: Mod assist;+2 safety/equipment Ambulation Distance (Feet): 50 Feet Assistive device: Rolling walker (2 wheeled) Gait Pattern/deviations: Step-to pattern;Decreased step length - left;Decreased stride length;Decreased dorsiflexion - left;Steppage   Gait  velocity interpretation: Below normal speed for age/gender General Gait Details: Assist with advancing LLE; left knee recurvatum noted during stance phase of gait. Cues to keep both UEs on RW as pt reaching for rail in hallway with right at times. Mod A for balance and LLE management.   Stairs            Wheelchair Mobility    Modified Rankin (Stroke Patients Only) Modified Rankin (Stroke Patients Only) Pre-Morbid Rankin Score: Moderately severe disability Modified Rankin: Moderately severe disability     Balance Overall balance assessment: Needs assistance Sitting-balance support: Feet supported;No upper extremity supported Sitting balance-Leahy Scale: Fair Sitting balance - Comments: Able to scoot back in chair asymmetrically.   Standing balance support: During functional activity   Standing balance comment: Relient on RW for support.                     Cognition Arousal/Alertness: Awake/alert Behavior During Therapy: Flat affect Overall Cognitive Status: History of cognitive impairments - at baseline                      Exercises General Exercises - Lower Extremity Long Arc Quad: Left;10 reps;Seated    General Comments General comments (skin integrity, edema, etc.): Requires total A and increased time to donn shoes. Pt wears AFO in Left shoe - increased difficulty donning today.      Pertinent Vitals/Pain Pain Assessment: No/denies pain    Home Living                      Prior Function  PT Goals (current goals can now be found in the care plan section) Progress towards PT goals: Progressing toward goals    Frequency  Min 4X/week    PT Plan Current plan remains appropriate    Co-evaluation             End of Session Equipment Utilized During Treatment: Gait belt Activity Tolerance: Patient tolerated treatment well Patient left: in chair;with call bell/phone within reach;with chair alarm set     Time:  8864-8472 PT Time Calculation (min) (ACUTE ONLY): 30 min  Charges:  $Gait Training: 8-22 mins $Therapeutic Activity: 8-22 mins                    G Codes:      Walden Statz A Annalaya Wile 09/11/2015, 12:09 PM Wray Kearns, [place], DPT [phone]

## 2015-09-11 NOTE — Progress Notes (Signed)
I continue to recommend an inpt rehab consult. I have texted  Dr. Allyson Sabal requesting an order for an inpt rehab consult. [phone]

## 2015-09-11 NOTE — Evaluation (Signed)
Clinical/Bedside Swallow Evaluation Patient Details  Name: Carlos Black MRN: [id number] Date of Birth: 09/05/1951  Today's Date: 09/11/2015 Time: SLP Start Time (ACUTE ONLY): 0822 SLP Stop Time (ACUTE ONLY): 0832 SLP Time Calculation (min) (ACUTE ONLY): 10 min  Past Medical History:  Past Medical History  Diagnosis Date  . Hypertension   . Coronary artery disease   . Myocardial infarction ([place])   . CAD (coronary artery disease) 09/05/2015  . SIRS (systemic inflammatory response syndrome) ([place]) 09/06/2015  . Acute renal failure (ARF) ([place]) 09/06/2015  . Type II diabetes mellitus ([place])   . Stroke [facility]) 2004    w/left sided weakness/notes 09/05/2015   Past Surgical History:  Past Surgical History  Procedure Laterality Date  . Basilar arterty stent  07/25/2004     Dr. Abel Presto Deveshwar/notes 02/01/2011   HPI:  64 y.o. male admitted with SIRS, left-sided weakness/numbness, acute renal failure, hyperkalemia. MRI showed no acute process; remote right BG and pontine CVA.  Despite passing RN swallow screen, MD still wants SLP swallow eval per RN.   Assessment / Plan / Recommendation Clinical Impression  Pt presents with normal oropharyngeal function with adequate mastication, brisk swallow response, and no s/s of aspiration.  Recommend resumption of regular diet.  No SLP f/u.     Aspiration Risk  Mild    Diet Recommendation Thin;Age appropriate regular solids   Medication Administration: Whole meds with liquid    Other  Recommendations Oral Care Recommendations: Oral care BID   Follow Up Recommendations       Frequency and Duration        Pertinent Vitals/Pain     SLP Swallow Goals     Swallow Study Prior Functional Status       General Date of Onset: 09/05/15 Other Pertinent Information: 64 y.o. male admitted with SIRS, left-sided weakness/numbness, acute renal failure, hyperkalemia. MRI showed no acute process; remote right BG and pontine CVA.  Despite passing RN  swallow screen, MD still wants SLP swallow eval per RN. Type of Study: Bedside swallow evaluation Previous Swallow Assessment: no Diet Prior to this Study: Regular;Thin liquids Temperature Spikes Noted: No Respiratory Status: Supplemental O2 delivered via (comment) History of Recent Intubation: No Behavior/Cognition: Alert;Cooperative Oral Cavity - Dentition: Adequate natural dentition/normal for age;Missing dentition Baseline Vocal Quality: Normal Volitional Cough: Strong Volitional Swallow: Able to elicit    Oral/Motor/Sensory Function Overall Oral Motor/Sensory Function: Appears within functional limits for tasks assessed (mild left asymmetry lower face)   Ice Chips Ice chips: Within functional limits   Thin Liquid Thin Liquid: Within functional limits Presentation: Cup;Straw    Nectar Thick Nectar Thick Liquid: Not tested   Honey Thick Honey Thick Liquid: Not tested   Puree Puree: Within functional limits Presentation: Self Fed   Solid   GO    Solid: Within functional limits Presentation: [place], [place].[email or web address]

## 2015-09-11 NOTE — Evaluation (Signed)
Clinical/Bedside Swallow Evaluation Patient Details  Name: Carlos Black MRN: [id number] Date of Birth: 1951/05/24  Today's Date: 09/11/2015 Time: SLP Start Time (ACUTE ONLY): 0822 SLP Stop Time (ACUTE ONLY): 0832 SLP Time Calculation (min) (ACUTE ONLY): 10 min  Past Medical History:  Past Medical History  Diagnosis Date  . Hypertension   . Coronary artery disease   . Myocardial infarction ([place])   . CAD (coronary artery disease) 09/05/2015  . SIRS (systemic inflammatory response syndrome) ([place]) 09/06/2015  . Acute renal failure (ARF) ([place]) 09/06/2015  . Type II diabetes mellitus ([place])   . Stroke [facility]) 2004    w/left sided weakness/notes 09/05/2015   Past Surgical History:  Past Surgical History  Procedure Laterality Date  . Basilar arterty stent  07/25/2004     Dr. Abel Presto Deveshwar/notes 02/01/2011   HPI:  64 y.o. male admitted with SIRS, left-sided weakness/numbness, acute renal failure, hyperkalemia. MRI showed no acute process; remote right BG and pontine CVA.  Despite passing RN swallow screen, MD still wants SLP swallow eval per RN.   Assessment / Plan / Recommendation Clinical Impression  Pt presents with normal oropharyngeal function with adequate mastication, brisk swallow response, and no s/s of aspiration.  Recommend resumption of regular diet.  No SLP f/u.     Aspiration Risk  Mild    Diet Recommendation Thin;Age appropriate regular solids   Medication Administration: Whole meds with liquid Compensations: Slow rate;Small sips/bites    Other  Recommendations Oral Care Recommendations: Oral care BID   Follow Up Recommendations       Frequency and Duration        Pertinent Vitals/Pain none    SLP Swallow Goals     Swallow Study Prior Functional Status       General Date of Onset: 09/05/15 Other Pertinent Information: 64 y.o. male admitted with SIRS, left-sided weakness/numbness, acute renal failure, hyperkalemia. MRI showed no acute process;  remote right BG and pontine CVA.  Despite passing RN swallow screen, MD still wants SLP swallow eval per RN. Type of Study: Bedside swallow evaluation Previous Swallow Assessment: no Diet Prior to this Study: Regular;Thin liquids Temperature Spikes Noted: No Respiratory Status: Supplemental O2 delivered via (comment) History of Recent Intubation: No Behavior/Cognition: Alert;Cooperative Oral Cavity - Dentition: Adequate natural dentition/normal for age;Missing dentition Self-Feeding Abilities: Able to feed self Patient Positioning: Upright in chair/Tumbleform Baseline Vocal Quality: Normal Volitional Cough: Strong Volitional Swallow: Able to elicit    Oral/Motor/Sensory Function Overall Oral Motor/Sensory Function: Appears within functional limits for tasks assessed (mild left asymmetry lower face) Labial ROM: Reduced left Labial Symmetry: Abnormal symmetry left Lingual ROM: Reduced right;Reduced left Lingual Symmetry: Abnormal symmetry left   Ice Chips Ice chips: Within functional limits   Thin Liquid Thin Liquid: Within functional limits Presentation: Cup;Straw    Nectar Thick Nectar Thick Liquid: Not tested   Honey Thick Honey Thick Liquid: Not tested   Puree Puree: Within functional limits Presentation: Self Fed   Solid   GO    Solid: Within functional limits Presentation: Self Fed       Houston Siren 09/11/2015,8:46 AM  Orbie Pyo Colvin Caroli.Ed [employer] [phone]

## 2015-09-11 NOTE — Progress Notes (Signed)
[facility] PROGRESS NOTE  Carlos Black OIN:[id number] DOB: August 14, 1951 DOA: 09/05/2015 PCP: [name], MD  Length of stay: 66   Assessment/Plan: Principal Problem:   SIRS (systemic inflammatory response syndrome) (HCC) Active Problems:   Diabetes mellitus (HCC)   Weakness   Hypotension   Hyperkalemia   ARF (acute renal failure) (HCC)   Dehydration   Left sided numbness   H/O: CVA (cerebrovascular accident)   CAD (coronary artery disease)   Oral thrush   Dysphagia   Anemia   Hyponatremia   Weakness of left side of body   Pelvic mass in male   Back pain    #1 Tumor fever vs UTI  Fever resolved, likely secondary to steroids. Patient on admission  noted to have a fever 101.4, tachycardic, tachypneic, and borderline hypotension. Chest x-ray negative for any acute infiltrates. Lactic acid level at 1.99. Patient does have a UTI  Blood culture negative, discontinue broad-spectrum antibiotics today, switched to Rocephin for UTI 2 more days Negative Panculture.   pro-calcitonin 0.86.   CT chest abdomen pelvis  Bulky left-sided retroperitoneal mass which is obstructing the left ureter and causing left-sided hydronephrosis,bilateral adrenal gland masses,aggressive lymphoma, retroperitoneal sarcoma or possibly testicular carcinoma We will DC Rocephin, urine culture with insignificant growth    #2 left-sided numbness and weakness/left-sided retroperitoneal mass  Patient has large left retroperitoneal mass found in MRI of the L-spine .   Patient with prior history of CVA risk factors of diabetes hypertension coronary artery disease.    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  Discussed with urology about the patient's left retroperitoneal mass, [name] on 10/13. He  reviewed the MRI and stated that there was no evidence of extrinsic compression on the kidney resulting in renal failure based on the MRI However CT abdomen pelvis shows left-sided hydronephrosis Discussed with [name] on 10/14 . 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 Waiting for further recommendations from urology I paged urology today   Anemia In the setting of renal disease, possible neoplasia, hemoglobin stable around 10  Chronic kidney disease stage IV-creatinine of 3.14, GFR less than 30 likely his baseline, but some imrpovement>2.52  #4 hyperkalemia Resolved  #6  oral thrush Continue diflucan.  #7 dysphagia Consult speech therapy for swallow evaluation.    #8 hyponatremia Likely secondary to hypovolemic hyponatremia , improving  #9 diabetes mellitus   hemoglobin A1c 6.2 . Currently has steroid-induced  hyperglycemia. Continue sliding scale insulin. Hold by mouth hypoglycemics  #10 history of CVA No acute CVA  this admission  #11 hyperlipidemia Once tolerating oral intake will resume home regimen of statin.  #12 hypertension Hold antihypertensives for now. Follow.   DVT prophylaxsis heparin  Code Status:      Code Status Orders        Start     Ordered   09/05/15 1958  Do not attempt resuscitation (DNR)   Continuous    Question Answer Comment  In the event of cardiac or respiratory ARREST Do not call a "code blue"   In the event of cardiac or respiratory ARREST Do not perform Intubation, CPR, defibrillation or ACLS   In the event of cardiac or respiratory ARREST Use medication by any route, position, wound care, and other measures to relive pain and suffering. May use oxygen, suction and manual treatment of airway obstruction as needed for comfort.      09/05/15 1957     Family Communication: family updated about patient's clinical progress Disposition Plan: SNF versus CIR    Brief narrative: 21 y o with history of CVA with residual left sided weakness, DM, HTN, CAD who presented to ED with sudden onset of left lower extremity numbness and left-sided weakness on the day of admission. Patient stated that he woke up around 10 or 11 AM went to try to use the commode when he noticed a numbness in his left leg. Patient stated that he was extremely weak and unable to get off the commode and had to have 3 people help him up. Patient also endorses a cough or caring mostly at night over the past 2-3 weeks which is productive in nature with some brownish sputum. Patient denies any fevers, no chills, no nausea, no chest pain, no emesis, no shortness of breath, no abdominal pain, no diarrhea, no constipation, no dysuria, no melena, no hematemesis, no hematochezia, no visual changes, no facial droop, no slurred speech. Patient was in the emergency room CT head which was done was negative. Patient was noted in the ED to have systolic blood pressures in the 90s with a temp of 101.4 respiratory rate in  the 30s and tachycardic with heart rate up to 109. Patient was given a liter bolus. Comprehensive metabolic profile obtained had a sodium of 129 potassium of 5.9 chloride of 93 BUN of 37 creatinine of 3.44 albumin of 3.0 AST of 50 bilirubin of 1.3 otherwise was within normal limits. Lactic acid level was 1.99. CBC had a hemoglobin of 11.2 otherwise was within normal limits. Chest x-ray which was done was negative for any acute infiltrate. Urinalysis was pending. Blood cultures were drawn. Patient was given a dose of IV vancomycin and IV Zosyn. [facility] were called to admit the patient for further evaluation and management.  Consultants:  None   Procedures None  Antibiotics: Anti-infectives    Start     Dose/Rate Route Frequency Ordered Stop   09/11/15 1000  fluconazole (DIFLUCAN) tablet 100 mg     100 mg Oral Daily 09/10/15 1258     09/09/15 1130  cefTRIAXone (ROCEPHIN) 1 g in dextrose 5 % 50 mL IVPB     1 g 100 mL/hr over 30 Minutes Intravenous Every 24 hours 09/09/15 1123     09/07/15 1800  vancomycin (VANCOCIN) IVPB 750 mg/150 ml premix  Status:  Discontinued     750 mg 150 mL/hr over 60 Minutes Intravenous Every 24 hours 09/07/15 1141  09/09/15 1123   09/07/15 1730  vancomycin (VANCOCIN) IVPB 1000 mg/200 mL premix  Status:  Discontinued     1,000 mg 200 mL/hr over 60 Minutes Intravenous Every 48 hours 09/05/15 1712 09/07/15 1141   09/06/15 2200  piperacillin-tazobactam (ZOSYN) IVPB 3.375 g  Status:  Discontinued     3.375 g 12.5 mL/hr over 240 Minutes Intravenous 3 times per day 09/06/15 1304 09/09/15 1123   09/06/15 1000  fluconazole (DIFLUCAN) IVPB 100 mg  Status:  Discontinued     100 mg 50 mL/hr over 60 Minutes Intravenous Every 24 hours 09/05/15 1947 09/10/15 1258   09/05/15 2200  fluconazole (DIFLUCAN) IVPB 200 mg     200 mg 100 mL/hr over 60 Minutes Intravenous  Once 09/05/15 1946 09/05/15 2316   09/05/15 1800  piperacillin-tazobactam (ZOSYN) IVPB 3.375 g  Status:   Discontinued     3.375 g 100 mL/hr over 30 Minutes Intravenous  Once 09/05/15 1758 09/05/15 1800   09/05/15 1800  vancomycin (VANCOCIN) IVPB 1000 mg/200 mL premix  Status:  Discontinued     1,000 mg 200 mL/hr over 60 Minutes Intravenous  Once 09/05/15 1758 09/05/15 1759   09/05/15 1730  piperacillin-tazobactam (ZOSYN) IVPB 2.25 g  Status:  Discontinued     2.25 g 100 mL/hr over 30 Minutes Intravenous 4 times per day 09/05/15 1712 09/06/15 1304   09/05/15 1730  vancomycin (VANCOCIN) IVPB 1000 mg/200 mL premix  Status:  Discontinued     1,000 mg 200 mL/hr over 60 Minutes Intravenous  Once 09/05/15 1712 09/05/15 1715   09/05/15 1730  vancomycin (VANCOCIN) 1,500 mg in sodium chloride 0.9 % 500 mL IVPB     1,500 mg 250 mL/hr over 120 Minutes Intravenous  Once 09/05/15 1715 09/05/15 2026         HPI/Subjective: Sitting comfortably in a chair, denies any shortness of breath, left leg extremity strength is improving  Objective: Filed Vitals:   09/10/15 0338 09/10/15 2044 09/11/15 0435 09/11/15 0600  BP: 125/84 126/87 96/67   Pulse: 80 85 77   Temp: 97.5 F (36.4 C) 97.7 F (36.5 C) 97.6 F (36.4 C)   TempSrc: Oral Oral Oral   Resp: 17 17 18    Height:      Weight:    75.751 kg (167 lb)  SpO2: 93% 97% 96%     Intake/Output Summary (Last 24 hours) at 09/11/15 1350 Last data filed at 09/11/15 1241  Gross per 24 hour  Intake    590 ml  Output    500 ml  Net     90 ml    Exam:  General: No acute respiratory distress Lungs: Clear to auscultation bilaterally without wheezes or crackles Cardiovascular: Regular rate and rhythm without murmur gallop or rub normal S1 and S2 Abdomen: Nontender, nondistended, soft, bowel sounds positive, no rebound, no ascites, no appreciable mass Extremities: No significant cyanosis, clubbing, or edema bilateral lower extremities     Data Review   Micro Results Recent Results (from the past 240 hour(s))  Blood culture (routine x 2)      Status: None   Collection Time: 09/05/15  3:00 PM  Result Value Ref Range Status   Specimen Description BLOOD RIGHT ARM  Final   Special Requests BOTTLES DRAWN AEROBIC AND ANAEROBIC 5CC  Final   Culture NO GROWTH 5 DAYS  Final   Report Status 09/10/2015 FINAL  Final  Blood culture (routine x 2)     Status: None   Collection Time:  09/05/15  4:43 PM  Result Value Ref Range Status   Specimen Description BLOOD LEFT HAND  Final   Special Requests BOTTLES DRAWN AEROBIC AND ANAEROBIC 4CC  Final   Culture NO GROWTH 5 DAYS  Final   Report Status 09/10/2015 FINAL  Final  MRSA PCR Screening     Status: None   Collection Time: 09/05/15  8:28 PM  Result Value Ref Range Status   MRSA by PCR NEGATIVE NEGATIVE Final    Comment:        The GeneXpert MRSA Assay (FDA approved for NASAL specimens only), is one component of a comprehensive MRSA colonization surveillance program. It is not intended to diagnose MRSA infection nor to guide or monitor treatment for MRSA infections.   Urine culture     Status: None   Collection Time: 09/05/15 10:20 PM  Result Value Ref Range Status   Specimen Description URINE, RANDOM  Final   Special Requests NONE  Final   Culture 3,000 COLONIES/mL INSIGNIFICANT GROWTH  Final   Report Status 09/07/2015 FINAL  Final  Culture, blood (routine x 2)     Status: None (Preliminary result)   Collection Time: 09/07/15  9:17 AM  Result Value Ref Range Status   Specimen Description BLOOD RIGHT ANTECUBITAL  Final   Special Requests BOTTLES DRAWN AEROBIC AND ANAEROBIC 5CC 10CC  Final   Culture NO GROWTH 4 DAYS  Final   Report Status PENDING  Incomplete  Culture, blood (routine x 2)     Status: None (Preliminary result)   Collection Time: 09/07/15  9:27 AM  Result Value Ref Range Status   Specimen Description BLOOD RIGHT HAND  Final   Special Requests BOTTLES DRAWN AEROBIC AND ANAEROBIC 10CC  Final   Culture NO GROWTH 4 DAYS  Final   Report Status PENDING  Incomplete     Radiology Reports Ct Abdomen Pelvis Wo Contrast  09/07/2015  CLINICAL DATA:  Evaluate retroperitoneal mass seen on recent MRI of the lumbar spine. EXAM: CT CHEST, ABDOMEN AND PELVIS WITHOUT CONTRAST TECHNIQUE: Multidetector CT imaging of the chest, abdomen and pelvis was performed following the standard protocol without IV contrast. COMPARISON:  Lumbar spine MRI 09/06/2015 and renal ultrasound 09/06/2015 FINDINGS: CT CHEST FINDINGS Mediastinum/Lymph Nodes: No chest wall mass, supraclavicular or axillary lymphadenopathy. Small scattered lymph nodes are noted. The thyroid gland is grossly normal. The heart is normal in size. No pericardial effusion. The aorta is normal in caliber. Mild tortuosity and scattered atherosclerotic calcifications. There are extensive 3 vessel coronary artery calcifications. Moderate distal esophageal wall thickening. Could not exclude neoplasm or esophagitis. No paraesophageal lymphadenopathy. Lungs/Pleura: Limited examination due to breathing motion artifact. There is a small left pleural effusion and areas of atelectasis. Rounded pleural density noted posteriorly at the right lung apex measures -45 Hounsfield units and is probably a small benign pleural lipoma. No worrisome pulmonary lesions to suggest a primary lung neoplasm or metastatic disease. Musculoskeletal: No significant bony findings. CT ABDOMEN PELVIS FINDINGS Hepatobiliary: No obvious hepatic lesions without contrast. Mild hepatomegaly. The gallbladder is distended and there are small gallstones noted. Pancreas: No pancreatic mass, inflammation or ductal dilatation. Spleen: Upper limits of normal.  No focal lesions. Adrenals/Urinary Tract: Bilateral adrenal gland masses. The right measures a maximum of 37.5 mm and the left 31 mm. No obvious renal mass. There is left-sided hydronephrosis due to a large left-sided retroperitoneal mass which is surrounding the left ureter and lower pole region of the left kidney. I do  not see  a fat plane between this mass and the aorta. It extends down into the upper left pelvis. This could reflect aggressive lymphoma or retroperitoneal sarcoma. The mass measures approximately 16.4 x 9.7 x 8.7 cm. Recommend image guided biopsy. PET-CT may also be helpful for further evaluation and accurate staging. Stomach/Bowel: The stomach, duodenum, small bowel and colon are grossly normal. No inflammatory changes, mass lesions or obstructive findings. The terminal ileum is normal. Vascular/Lymphatic: Large left-sided retroperitoneal mass as discussed above. Moderate atherosclerotic calcifications involving the aorta but no aneurysm. Reproductive: The bladder, prostate gland and seminal vesicles are unremarkable. Other: 3.6 x 2.1 cm internal iliac nodal mass on the left side. Small amount of free the pelvic fluid and presacral edema. Bilateral inguinal hernias containing fat and fluid and small bowel on the right side. Suspect undescended testicle in the left inguinal canal. This would raise the possibility of testicular cancer. No inguinal adenopathy. Musculoskeletal: No significant bony findings. IMPRESSION: 1. Bulky left-sided retroperitoneal mass which is obstructing the left ureter and causing left-sided hydronephrosis. There are also bilateral adrenal gland masses. Findings could be due to aggressive lymphoma, retroperitoneal sarcoma or possibly testicular carcinoma given the fact that it appears the patients left testicle is in the left inguinal canal. No obvious testicular mass. Scrotal ultrasound may be helpful for further evaluation. Image guided Biopsy is recommended of the left retroperitoneal mass. 2. No significant findings in the chest. No evidence of a primary lung neoplasm or metastatic pulmonary disease. No mediastinal or hilar mass or adenopathy. 3. Extensive 3 vessel coronary artery calcifications. 4. Small left pleural effusion. 5. Distended gallbladder and cholelithiasis. 6. Bilateral  inguinal hernias containing fat, fluid and small bowel on the right side. Electronically Signed   By: Marijo Sanes M.D.   On: 09/07/2015 13:35   Dg Chest 2 View  09/05/2015  CLINICAL DATA:  64 year old with acute onset of left lower extremity paresis/numbness which began earlier today. Fever. Current history of hypertension and diabetes. Prior stroke and MI. EXAM: CHEST  2 VIEW COMPARISON:  07/26/2004, 07/20/2004. FINDINGS: AP semi-erect and lateral images were obtained. Chronic elevation of the right hemidiaphragm with chronic scar/atelectasis involving the right lower lobe and right middle lobe, unchanged. Lungs otherwise clear. No localized airspace consolidation. No pleural effusions. No pneumothorax. Normal pulmonary vascularity. Cardiac silhouette upper normal in size for AP technique. Thoracic aorta mildly atherosclerotic and tortuous, unchanged. Hilar and mediastinal contours otherwise unremarkable. Visualized bony thorax intact with only mild degenerative changes in the mid thoracic region. IMPRESSION: 1.  No acute cardiopulmonary disease. 2. Stable chronic elevation of the right hemidiaphragm and chronic scar/atelectasis involving the right lower lobe and right middle lobe. Electronically Signed   By: Evangeline Dakin M.D.   On: 09/05/2015 15:26   Ct Head Wo Contrast  09/05/2015  CLINICAL DATA:  Left-sided weakness from previous CVA, weakness has increased over the last day with difficulty walking EXAM: CT HEAD WITHOUT CONTRAST TECHNIQUE: Contiguous axial images were obtained from the base of the skull through the vertex without intravenous contrast. COMPARISON:  12/21/2011 FINDINGS: The bony calvarium is intact. Atrophic changes are noted. No findings to suggest acute hemorrhage, acute infarction or space-occupying mass lesion are noted. IMPRESSION: Atrophic changes without acute abnormality. Electronically Signed   By: Inez Catalina M.D.   On: 09/05/2015 16:37   Ct Chest Wo  Contrast  09/07/2015  CLINICAL DATA:  Evaluate retroperitoneal mass seen on recent MRI of the lumbar spine. EXAM: CT CHEST, ABDOMEN AND PELVIS WITHOUT CONTRAST  TECHNIQUE: Multidetector CT imaging of the chest, abdomen and pelvis was performed following the standard protocol without IV contrast. COMPARISON:  Lumbar spine MRI 09/06/2015 and renal ultrasound 09/06/2015 FINDINGS: CT CHEST FINDINGS Mediastinum/Lymph Nodes: No chest wall mass, supraclavicular or axillary lymphadenopathy. Small scattered lymph nodes are noted. The thyroid gland is grossly normal. The heart is normal in size. No pericardial effusion. The aorta is normal in caliber. Mild tortuosity and scattered atherosclerotic calcifications. There are extensive 3 vessel coronary artery calcifications. Moderate distal esophageal wall thickening. Could not exclude neoplasm or esophagitis. No paraesophageal lymphadenopathy. Lungs/Pleura: Limited examination due to breathing motion artifact. There is a small left pleural effusion and areas of atelectasis. Rounded pleural density noted posteriorly at the right lung apex measures -45 Hounsfield units and is probably a small benign pleural lipoma. No worrisome pulmonary lesions to suggest a primary lung neoplasm or metastatic disease. Musculoskeletal: No significant bony findings. CT ABDOMEN PELVIS FINDINGS Hepatobiliary: No obvious hepatic lesions without contrast. Mild hepatomegaly. The gallbladder is distended and there are small gallstones noted. Pancreas: No pancreatic mass, inflammation or ductal dilatation. Spleen: Upper limits of normal.  No focal lesions. Adrenals/Urinary Tract: Bilateral adrenal gland masses. The right measures a maximum of 37.5 mm and the left 31 mm. No obvious renal mass. There is left-sided hydronephrosis due to a large left-sided retroperitoneal mass which is surrounding the left ureter and lower pole region of the left kidney. I do not see a fat plane between this mass and the  aorta. It extends down into the upper left pelvis. This could reflect aggressive lymphoma or retroperitoneal sarcoma. The mass measures approximately 16.4 x 9.7 x 8.7 cm. Recommend image guided biopsy. PET-CT may also be helpful for further evaluation and accurate staging. Stomach/Bowel: The stomach, duodenum, small bowel and colon are grossly normal. No inflammatory changes, mass lesions or obstructive findings. The terminal ileum is normal. Vascular/Lymphatic: Large left-sided retroperitoneal mass as discussed above. Moderate atherosclerotic calcifications involving the aorta but no aneurysm. Reproductive: The bladder, prostate gland and seminal vesicles are unremarkable. Other: 3.6 x 2.1 cm internal iliac nodal mass on the left side. Small amount of free the pelvic fluid and presacral edema. Bilateral inguinal hernias containing fat and fluid and small bowel on the right side. Suspect undescended testicle in the left inguinal canal. This would raise the possibility of testicular cancer. No inguinal adenopathy. Musculoskeletal: No significant bony findings. IMPRESSION: 1. Bulky left-sided retroperitoneal mass which is obstructing the left ureter and causing left-sided hydronephrosis. There are also bilateral adrenal gland masses. Findings could be due to aggressive lymphoma, retroperitoneal sarcoma or possibly testicular carcinoma given the fact that it appears the patients left testicle is in the left inguinal canal. No obvious testicular mass. Scrotal ultrasound may be helpful for further evaluation. Image guided Biopsy is recommended of the left retroperitoneal mass. 2. No significant findings in the chest. No evidence of a primary lung neoplasm or metastatic pulmonary disease. No mediastinal or hilar mass or adenopathy. 3. Extensive 3 vessel coronary artery calcifications. 4. Small left pleural effusion. 5. Distended gallbladder and cholelithiasis. 6. Bilateral inguinal hernias containing fat, fluid and small  bowel on the right side. Electronically Signed   By: Marijo Sanes M.D.   On: 09/07/2015 13:35   Mr Brain Wo Contrast  09/06/2015  CLINICAL DATA:  LEFT lower extremity numbness and LEFT-sided weakness for 2 days. Night cough for 2-3 weeks. History of stroke and LEFT-sided weakness, diabetes, hypertension, coronary artery disease. EXAM: MRI HEAD WITHOUT CONTRAST  TECHNIQUE: Multiplanar, multiecho pulse sequences of the brain and surrounding structures were obtained without intravenous contrast. COMPARISON:  CT head September 05, 2015 at 1626 hours FINDINGS: Multiple sequences are mild or moderately motion degraded. No reduced diffusion to suggest acute ischemia. No susceptibility artifact to suggest hemorrhage. Ventricles and sulci are normal for patient's age. Old RIGHT ventral pons infarct. Old RIGHT basal ganglia lacunar infarct. Mild RIGHT cerebral peduncle volume loss compatible with wallerian degeneration. Mild white matter changes compatible with chronic small vessel ischemic disease, decreased sensitivity due to patient motion. No abnormal extra-axial fluid collections. Dolicoectatic intracranial vessel flow voids seen at the skull base. Ocular globes and orbital contents are grossly normal though motion degraded. Paranasal sinuses and mastoid air cells are well aerated. No abnormal sellar expansion. No is abnormal cerebellar tonsillar descent below the foramen magnum. No suspicious bone marrow signal. IMPRESSION: No acute intracranial process on this motion degraded examination. Involutional changes and mild chronic small vessel ischemic disease. Old RIGHT pontine infarct. Old RIGHT basal ganglia lacunar infarct. Electronically Signed   By: Elon Alas M.D.   On: 09/06/2015 02:55   Mr Lumbar Spine Wo Contrast  09/06/2015  CLINICAL DATA:  LEFT lower extremity numbness and LEFT-sided weakness for 2 days. History of stroke in LEFT-sided weakness, diabetes, hypertension, coronary artery disease. EXAM:  MRI LUMBAR SPINE WITHOUT CONTRAST TECHNIQUE: Multiplanar, multisequence MR imaging of the lumbar spine was performed. No intravenous contrast was administered. COMPARISON:  None. FINDINGS: Lumbar vertebral bodies and posterior elements are intact and aligned with maintenance of lumbar lordosis. Intervertebral discs demonstrate normal morphology, with slight decreased T2 signal within the lower lumbar disc consistent with mild desiccation. Minimal subacute discogenic endplate changes at all lumbar levels, no STIR signal abnormality to suggest acute osseous process. Conus medullaris terminates at L1 and appears normal in morphology and signal characteristics. Limited assessment of cauda equina due to patient motion. LEFT abdominal mass partially imaged, with mass effect on the LEFT psoas muscle. Probable lymphadenopathy along LEFT Common iliac chain. Probable obstructive uropathy on the LEFT. Partially imaged sub cm gallstone. Level by level evaluation: L1-2 and L2-3: No significant disc bulge, canal stenosis or neural foraminal narrowing. L3-4: Small broad-based disc bulge, mild facet and ligamentum flavum redundancy without canal stenosis. Minimal bilateral neural foraminal narrowing. L4-5: Small broad-based disc bulge asymmetric to LEFT. Mild facet arthropathy and ligamentum flavum redundancy without canal stenosis. Minimal LEFT neural foraminal narrowing. L5-S1: No disc bulge, canal stenosis nor neural foraminal narrowing. IMPRESSION: No acute lumbar spine fracture nor malalignment. No canal stenosis. Minimal L3-4 and L4-5 neural foraminal narrowing. Large LEFT retroperitoneal mass with mass effect on the LEFT psoas muscle and probable lymphadenopathy along LEFT iliac chain. Recommend CT of the abdomen and pelvis with contrast. Mass appears to result in LEFT obstructive uropathy. Electronically Signed   By: Elon Alas M.D.   On: 09/06/2015 23:18   US Scrotum  09/07/2015  CLINICAL DATA:  64 year old male  with palpable testicular mass. EXAM: ULTRASOUND OF SCROTUM TECHNIQUE: Complete ultrasound examination of the testicles, epididymis, and other scrotal structures was performed. COMPARISON:  None. FINDINGS: Right testicle Measurements: 4.5 x 2.6 x 2 cm. No mass or microlithiasis visualized. Left testicle Measurements: 4.4 x 3.4 x 2.9 cm. A 2.2 x 3.3 x 2.3 cm oval hypoechoic area within the left testicle is noted with some vascular flow. No other abnormalities identified. Right epididymis:  Not visualized Left epididymis:  Normal in size and appearance. Hydrocele:  Small - moderate bilateral hydroceles  noted. Varicocele:  Mild left varicocele noted. IMPRESSION: 2.2 x 3.3 x 2.3 cm oval hypoechoic area within the left testicle suspicious for testicular mass/neoplasm. Urology consultation is recommended. Unremarkable right testicle. Small to moderate bilateral hydroceles. Electronically Signed   By: Margarette Canada M.D.   On: 09/07/2015 20:02   US Renal  09/06/2015  CLINICAL DATA:  Acute renal failure. EXAM: RENAL / URINARY TRACT ULTRASOUND COMPLETE COMPARISON:  None. FINDINGS: Right Kidney: Length: 9.6 cm. Heterogeneous increased parenchymal echotexture with parenchymal thinning suggesting chronic medical renal disease. No hydronephrosis or solid mass. Left Kidney: Length: 9.8 cm. Heterogeneous increased parenchymal echotexture likely indicating chronic medical renal disease. Appears to be circumscribed hypoechoic appearance of the lower pole of the left kidney. This may indicate edema versus abscess. No hydronephrosis or solid mass identified. Bladder: No bladder wall thickening or filling defect. Bilateral urine flow jets are demonstrated on color flow Doppler imaging. Incidental note of mildly enlarged prostate gland measuring 3.6 x 3.6 x 3.3 cm. IMPRESSION: Heterogeneous parenchymal echotexture bilaterally suggesting chronic medical renal disease. Hypoechoic appearance of the lower pole left kidney could indicate  edema, pyelonephritis, or abscess. No hydronephrosis in either kidney. Mild prostate enlargement. Electronically Signed   By: Lucienne Capers M.D.   On: 09/06/2015 03:19   Ct Biopsy  09/08/2015  CLINICAL DATA:  Left retroperitoneal mass.  Left testicular mass. EXAM: CT-GUIDED BIOPSY LEFT RETROPERITONEAL MASS.  CORE. MEDICATIONS AND MEDICAL HISTORY: Versed 1 mg, Fentanyl 25 mcg. Additional Medications: None. ANESTHESIA/SEDATION: Moderate sedation time: 6 minutes PROCEDURE: The procedure, risks, benefits, and alternatives were explained to the patient. Questions regarding the procedure were encouraged and answered. The patient understands and consents to the procedure. The back was prepped with ChloraPrep in a sterile fashion, and a sterile drape was applied covering the operative field. A sterile gown and sterile gloves were used for the procedure. Under CT guidance, a(n) 17 gauge guide needle was advanced into the left retroperitoneal mass. Subsequently 3 18 gauge core biopsies were obtained. The guide needle was removed. Final imaging was performed. Patient tolerated the procedure well without complication. Vital sign monitoring by nursing staff during the procedure will continue as patient is in the special procedures unit for post procedure observation. FINDINGS: The images document guide needle placement within the left retroperitoneal mass. Post biopsy images demonstrate no hemorrhage. COMPLICATIONS: None IMPRESSION: Successful CT-guided core biopsy of a left retroperitoneal mass. Electronically Signed   By: Marybelle Killings M.D.   On: 09/08/2015 16:50   Portable Chest 1 View  09/06/2015  CLINICAL DATA:  Fever. EXAM: PORTABLE CHEST 1 VIEW COMPARISON:  09/05/2015.  07/20/2004. FINDINGS: Mediastinum and hilar structures are normal. Heart size stable. Persistent right lower lobe subsegmental atelectasis and/or scarring. No pleural effusion pneumothorax. IMPRESSION: Stable right base subsegmental atelectasis  and/or scarring. No acute cardiopulmonary disease. Electronically Signed   By: Marcello Moores  Register   On: 09/06/2015 07:19     CBC  Recent Labs Lab 09/05/15 1500 09/06/15 0242 09/07/15 0917 09/08/15 0404 09/09/15 0353  WBC 5.9 4.2 4.3 3.8* 5.8  HGB 11.2* 9.8* 9.3* 9.2* 10.0*  HCT 34.3* 30.7* 28.6* 28.1* 30.6*  PLT 267 205 180 189 229  MCV 75.9* 77.3* 77.3* 77.0* 76.3*  MCH 24.8* 24.7* 25.1* 25.2* 24.9*  MCHC 32.7 31.9 32.5 32.7 32.7  RDW 16.1* 16.2* 16.5* 16.6* 16.7*  LYMPHSABS 0.7  --  0.9  --   --   MONOABS 1.6*  --  0.8  --   --  EOSABS 0.1  --  0.2  --   --   BASOSABS 0.0  --  0.0  --   --     Chemistries   Recent Labs Lab 09/05/15 1500 09/05/15 1837 09/05/15 2043 09/06/15 0242 09/07/15 0917 09/08/15 0404 09/09/15 0353 09/09/15 2148 09/10/15 0427 09/11/15 0258  NA 129*  --  132* 135 135 133* 138  --  139 137  K 5.9*  --  5.0 5.0 5.2* 4.3 4.3  --  4.4 4.6  CL 93*  --  98* 100* 105 98* 102  --  104 104  CO2 25  --  23 20* 21* 22 24  --  20* 23  GLUCOSE 150*  --  117* 116* 103* 225* 248*  --  264* 243*  BUN 37*  --  32* 31* 24* 30* 38*  --  45* 45*  CREATININE 3.44*  --  3.24* 3.14* 3.01* 3.19* 3.14*  --  2.93* 2.52*  CALCIUM 9.2  --  8.2* 8.4* 8.2* 7.8* 8.3*  --  8.1* 7.5*  MG  --  1.6* 1.8 2.3  --   --   --  2.0  --   --   AST 50*  --   --  34 41 35 25  --   --   --   ALT 21  --   --  19 20 22 18   --   --   --   ALKPHOS 79  --   --  61 64 63 54  --   --   --   BILITOT 1.3*  --   --  1.0 1.0 0.8 0.7  --   --   --    ------------------------------------------------------------------------------------------------------------------ estimated creatinine clearance is 27.4 mL/min (by C-G formula based on Cr of 2.52). ------------------------------------------------------------------------------------------------------------------ No results for input(s): HGBA1C in the last 72  hours. ------------------------------------------------------------------------------------------------------------------ No results for input(s): CHOL, HDL, LDLCALC, TRIG, CHOLHDL, LDLDIRECT in the last 72 hours. ------------------------------------------------------------------------------------------------------------------ No results for input(s): TSH, T4TOTAL, T3FREE, THYROIDAB in the last 72 hours.  Invalid input(s): FREET3 ------------------------------------------------------------------------------------------------------------------ No results for input(s): VITAMINB12, FOLATE, FERRITIN, TIBC, IRON, RETICCTPCT in the last 72 hours.  Coagulation profile  Recent Labs Lab 09/05/15 1500  INR 1.26    No results for input(s): DDIMER in the last 72 hours.  Cardiac Enzymes  Recent Labs Lab 09/05/15 1837  TROPONINI <0.03   ------------------------------------------------------------------------------------------------------------------ Invalid input(s): POCBNP   CBG:  Recent Labs Lab 09/10/15 1117 09/10/15 1626 09/10/15 2140 09/11/15 0603 09/11/15 1114  GLUCAP 296* 272* 213* 208* 304*       Studies: No results found.    Lab Results  Component Value Date   HGBA1C 6.2* 09/05/2015   HGBA1C 13.2* 12/22/2011   Lab Results  Component Value Date   CREATININE 2.52* 09/11/2015       Scheduled Meds: . aspirin  81 mg Oral Daily  . cefTRIAXone (ROCEPHIN)  IV  1 g Intravenous Q24H  . dexamethasone  4 mg Intravenous 4 times per day  . feeding supplement (GLUCERNA SHAKE)  237 mL Oral TID BM  . fluconazole  100 mg Oral Daily  . guaiFENesin  1,200 mg Oral BID  . heparin  5,000 Units Subcutaneous 3 times per day  . insulin aspart  0-9 Units Subcutaneous TID WC  . pantoprazole  40 mg Oral QHS  . sodium chloride  3 mL Intravenous Q12H   Continuous Infusions: . sodium chloride 50 mL/hr at 09/11/15 0319  Principal Problem:   SIRS (systemic inflammatory  response syndrome) (HCC) Active Problems:   Diabetes mellitus (HCC)   Weakness   Hypotension   Hyperkalemia   ARF (acute renal failure) (HCC)   Dehydration   Left sided numbness   H/O: CVA (cerebrovascular accident)   CAD (coronary artery disease)   Oral thrush   Dysphagia   Anemia   Hyponatremia   Weakness of left side of body   Pelvic mass in male   Back pain    Time spent: 74 minutes   [facility] Pager [phone]. If 7PM-7AM, please contact night-coverage at [email or web address], password [facility] 09/11/2015, 1:50 PM  LOS: 6 days

## 2015-09-11 NOTE — Progress Notes (Signed)
CSW spoke with patient and patient wife concerning bed offers at this time- patient preference is for [facility] which is close to home- [facility] is not contracted with [facility] but is looking into if they have out of network benefits  Patient wife is interested in [facility] if [facility] cannot accept  Per patient they are still unsure if he will need to go to [facility] or not- disposition pending  CSW will continue to follow  Domenica Reamer, [place] Worker [phone]

## 2015-09-11 NOTE — Progress Notes (Signed)
Inpatient Diabetes Program Recommendations  AACE/ADA: New Consensus Statement on Inpatient Glycemic Control (2015)  Target Ranges:  Prepandial:   less than 140 mg/dL      Peak postprandial:   less than 180 mg/dL (1-2 hours)      Critically ill patients:  140 - 180 mg/dL   Review of Glycemic Control  Diabetes history: DM 2 Outpatient Diabetes medications: Amaryl 4 mg Daily, Metformin 1,000 mg BID Current orders for Inpatient glycemic control: Novolog Sensitive TID  Inpatient Diabetes Program Recommendations: Correction (SSI): Glucose consistently in the 200's. Please consider increasing correction to Novolog Moderate scale Q4hrs.   Thanks,  Tama Headings RN, MSN, [facility] Inpatient Diabetes Coordinator Team Pager [phone] (8a-5p)

## 2015-09-11 NOTE — Progress Notes (Signed)
Echocardiogram 2D Echocardiogram has been performed.  Carlos Black 09/11/2015, 1:47 PM

## 2015-09-12 DIAGNOSIS — R531 Weakness: Secondary | ICD-10-CM

## 2015-09-12 DIAGNOSIS — R6510 Systemic inflammatory response syndrome (SIRS) of non-infectious origin without acute organ dysfunction: Secondary | ICD-10-CM

## 2015-09-12 DIAGNOSIS — C8330 Diffuse large B-cell lymphoma, unspecified site: Principal | ICD-10-CM

## 2015-09-12 DIAGNOSIS — N19 Unspecified kidney failure: Secondary | ICD-10-CM

## 2015-09-12 LAB — GLUCOSE, CAPILLARY
GLUCOSE-CAPILLARY: 285 mg/dL — AB (ref 65–99)
GLUCOSE-CAPILLARY: 258 mg/dL — AB (ref 65–99)
Glucose-Capillary: 243 mg/dL — ABNORMAL HIGH (ref 65–99)

## 2015-09-12 LAB — BASIC METABOLIC PANEL
Anion gap: 13 (ref 5–15)
BUN: 48 mg/dL — AB (ref 6–20)
CHLORIDE: 107 mmol/L (ref 101–111)
CO2: 18 mmol/L — ABNORMAL LOW (ref 22–32)
Calcium: 7.5 mg/dL — ABNORMAL LOW (ref 8.9–10.3)
Creatinine, Ser: 2.35 mg/dL — ABNORMAL HIGH (ref 0.61–1.24)
GFR calc Af Amer: 32 mL/min — ABNORMAL LOW (ref >60)
GFR calc non Af Amer: 27 mL/min — ABNORMAL LOW (ref >60)
GLUCOSE: 281 mg/dL — AB (ref 65–99)
POTASSIUM: 4.4 mmol/L (ref 3.5–5.1)
Sodium: 138 mmol/L (ref 135–145)

## 2015-09-12 LAB — CULTURE, BLOOD (ROUTINE X 2)
CULTURE: NO GROWTH
Culture: NO GROWTH

## 2015-09-12 MED ORDER — INSULIN GLARGINE 100 UNIT/ML ~~LOC~~ SOLN
15.0000 [IU] | Freq: Every day | SUBCUTANEOUS | Status: DC
  Administered 2015-09-12 – 2015-09-14 (×3): 15 [IU] via SUBCUTANEOUS
  Filled 2015-09-12 (×3): qty 0.15

## 2015-09-12 MED ORDER — HEPARIN SODIUM (PORCINE) 5000 UNIT/ML IJ SOLN
5000.0000 [IU] | Freq: Three times a day (TID) | INTRAMUSCULAR | Status: DC
  Administered 2015-09-12 – 2015-09-14 (×5): 5000 [IU] via SUBCUTANEOUS
  Filled 2015-09-12 (×5): qty 1

## 2015-09-12 NOTE — Progress Notes (Signed)
Inpatient Diabetes Program Recommendations  AACE/ADA: New Consensus Statement on Inpatient Glycemic Control (2015)  Target Ranges:  Prepandial:   less than 140 mg/dL      Peak postprandial:   less than 180 mg/dL (1-2 hours)      Critically ill patients:  140 - 180 mg/dL   Review of Glycemic Control  Results for Carlos Black, Carlos Black (MRN [id number]) as of 09/12/2015 08:35  Ref. Range 09/11/2015 06:03 09/11/2015 11:14 09/11/2015 16:05 09/11/2015 20:43 09/12/2015 06:33  Glucose-Capillary Latest Ref Range: 65-99 mg/dL 208 (H) 304 (H) 348 (H) 267 (H) 243 (H)    Diabetes history: DM 2 Outpatient Diabetes medications: Amaryl 4 mg Daily, Metformin 1,000 mg BID Current orders for Inpatient glycemic control: Novolog Sensitive TID  Inpatient Diabetes Program Recommendations: Correction (SSI): Glucose consistently in the 200's. Please consider increasing correction to Novolog moderate scale 0-15 units Q4hrs and starting low dose basal insulin,  Lantus 15 units qhs (0.2units/kg).  Insulin will need to be titrated off as steroids are decreased.  Gentry Fitz, RN, BA, MHA, CDE Diabetes Coordinator Inpatient Diabetes Program  [phone] (Team Pager) [phone] ([place]) 09/12/2015 8:45 AM   Gentry Fitz, RN, BA, MHA, CDE Diabetes Coordinator Inpatient Diabetes Program  [phone] (Team Pager) [phone] ([place]) 09/12/2015 8:45 AM

## 2015-09-12 NOTE — Progress Notes (Signed)
OT Cancellation Note  Patient Details Name: Carlos Black MRN: [id number] DOB: [birth year]   Cancelled Treatment:    Reason Eval/Treat Not Completed: Other (comment) Pt in process of being transferred to [facility] for treatment. Recommend pt continue to OT/PT at [place], OTR/L  [phone] 09/12/2015 09/12/2015, 3:32 PM

## 2015-09-12 NOTE — Progress Notes (Signed)
Subjective: Patient reports no new issues. He denies any discomfort or pain. Biopsy of retroperitoneal mass is still pending. Creatinine continues to improve.   Objective: Vital signs in last 24 hours: Temp:  [97.6 F (36.4 C)-98.1 F (36.7 C)] 97.9 F (36.6 C) (10/18 0442) Pulse Rate:  [72-77] 72 (10/18 0442) Resp:  [18-20] 18 (10/18 0442) BP: (120-129)/(70-81) 120/70 mmHg (10/18 0442) SpO2:  [93 %-96 %] 93 % (10/18 0442) Weight:  [78.472 kg (173 lb)] 78.472 kg (173 lb) (10/18 0442)  Intake/Output from previous day: 10/17 0701 - 10/18 0700 In: 8119 [P.O.:680; I.V.:575; IV Piggyback:50] Out: 147 [Urine:750; Stool:1] Intake/Output this shift:    Physical Exam:  Constitutional: Vital signs reviewed. WD WN in NAD   Eyes: PERRL, No scleral icterus.   Cardiovascular: RRR Pulmonary/Chest: Normal effort Abdominal: Soft. Non-tender, non-distended, bowel sounds are normal, no masses, organomegaly, or guarding present.  Genitourinary: Condom catheter/ no change in testicular mass Extremities: No cyanosis or edema   Lab Results: No results for input(s): HGB, HCT in the last 72 hours. BMET  Recent Labs  09/11/15 0258 09/12/15 0242  NA 137 138  K 4.6 4.4  CL 104 107  CO2 23 18*  GLUCOSE 243* 281*  BUN 45* 48*  CREATININE 2.52* 2.35*  CALCIUM 7.5* 7.5*   No results for input(s): LABPT, INR in the last 72 hours. No results for input(s): LABURIN in the last 72 hours. Results for orders placed or performed during the hospital encounter of 09/05/15  Blood culture (routine x 2)     Status: None   Collection Time: 09/05/15  3:00 PM  Result Value Ref Range Status   Specimen Description BLOOD RIGHT ARM  Final   Special Requests BOTTLES DRAWN AEROBIC AND ANAEROBIC 5CC  Final   Culture NO GROWTH 5 DAYS  Final   Report Status 09/10/2015 FINAL  Final  Blood culture (routine x 2)     Status: None   Collection Time: 09/05/15  4:43 PM  Result Value Ref Range Status   Specimen  Description BLOOD LEFT HAND  Final   Special Requests BOTTLES DRAWN AEROBIC AND ANAEROBIC 4CC  Final   Culture NO GROWTH 5 DAYS  Final   Report Status 09/10/2015 FINAL  Final  MRSA PCR Screening     Status: None   Collection Time: 09/05/15  8:28 PM  Result Value Ref Range Status   MRSA by PCR NEGATIVE NEGATIVE Final    Comment:        The GeneXpert MRSA Assay (FDA approved for NASAL specimens only), is one component of a comprehensive MRSA colonization surveillance program. It is not intended to diagnose MRSA infection nor to guide or monitor treatment for MRSA infections.   Urine culture     Status: None   Collection Time: 09/05/15 10:20 PM  Result Value Ref Range Status   Specimen Description URINE, RANDOM  Final   Special Requests NONE  Final   Culture 3,000 COLONIES/mL INSIGNIFICANT GROWTH  Final   Report Status 09/07/2015 FINAL  Final  Culture, blood (routine x 2)     Status: None (Preliminary result)   Collection Time: 09/07/15  9:17 AM  Result Value Ref Range Status   Specimen Description BLOOD RIGHT ANTECUBITAL  Final   Special Requests BOTTLES DRAWN AEROBIC AND ANAEROBIC 5CC 10CC  Final   Culture NO GROWTH 4 DAYS  Final   Report Status PENDING  Incomplete  Culture, blood (routine x 2)     Status: None (Preliminary  result)   Collection Time: 09/07/15  9:27 AM  Result Value Ref Range Status   Specimen Description BLOOD RIGHT HAND  Final   Special Requests BOTTLES DRAWN AEROBIC AND ANAEROBIC 10CC  Final   Culture NO GROWTH 4 DAYS  Final   Report Status PENDING  Incomplete    Studies/Results: No results found.  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 Creatinine continues to improve. Hydronephrosis was minimal and I suspect double-J stent placement will probably have marginal to no significant benefit to his overall systemic  renal function. It would make sense to consider concurrent left radical orchiectomy along with cystoscopy and retrograde pyelogram with placement of a stent if indeed obstruction is appreciated on retrograde pyelography.  This may be able to be performed later this week. We'll discuss with medical oncology.  If patient transfers to [facility] long for the procedure would make sense to keep him at that institution given the cancer diagnosis  We'll discuss with Dr. Benay Spice   LOS: 7 days   Donnetta Gillin S 09/12/2015, 7:30 AM

## 2015-09-12 NOTE — Progress Notes (Signed)
Chief Complaint: Patient was seen in consultation today for bone marrow biopsy at the request of Dr. Benay Spice  Referring Physician(s): Dr. Benay Spice  History of Present Illness: Carlos Black is a 64 y.o. male with probable lymphoma. Had biopsy of retroperitoneal mass biopsy a few days ago. He has been seen by oncology, who now request staging bone marrow biopsy as part of workup. Chart reviewed, pt to be transferred to [facility] today for possible Urologic surgery later this week. Pt sitting up in chair, no acute c/o.  Past Medical History  Diagnosis Date  . Hypertension   . Coronary artery disease   . Myocardial infarction ([place])   . CAD (coronary artery disease) 09/05/2015  . SIRS (systemic inflammatory response syndrome) ([place]) 09/06/2015  . Acute renal failure (ARF) ([place]) 09/06/2015  . Type II diabetes mellitus ([place])   . Stroke [facility]) 2004    w/left sided weakness/notes 09/05/2015    Past Surgical History  Procedure Laterality Date  . Basilar arterty stent  07/25/2004     Dr. Abel Presto Deveshwar/notes 02/01/2011    Allergies: Review of patient's allergies indicates no known allergies.  Medications:  Current facility-administered medications:  .  0.9 %  sodium chloride infusion, , Intravenous, Continuous, Reyne Dumas, MD, Last Rate: 75 mL/hr at 09/11/15 2307 .  acetaminophen (TYLENOL) tablet 325 mg, 325 mg, Oral, Q4H PRN, Reyne Dumas, MD, 325 mg at 09/07/15 0900 .  alum & mag hydroxide-simeth (MAALOX/MYLANTA) 200-200-20 MG/5ML suspension 30 mL, 30 mL, Oral, Q6H PRN, Eugenie Filler, MD, 30 mL at 09/09/15 2014 .  aspirin chewable tablet 81 mg, 81 mg, Oral, Daily, Eugenie Filler, MD, 81 mg at 09/12/15 0950 .  dexamethasone (DECADRON) 1 MG/ML solution 4 mg, 4 mg, Oral, Q12H, Reyne Dumas, MD, 4 mg at 09/12/15 0951 .  feeding supplement (GLUCERNA SHAKE) (GLUCERNA SHAKE) liquid 237 mL, 237 mL, Oral, TID BM, Jenifer A Williams, RD, 237 mL at 09/11/15 2307 .   fluconazole (DIFLUCAN) tablet 100 mg, 100 mg, Oral, Daily, Reyne Dumas, MD, 100 mg at 09/12/15 0950 .  guaiFENesin (MUCINEX) 12 hr tablet 1,200 mg, 1,200 mg, Oral, BID, Eugenie Filler, MD, 1,200 mg at 09/12/15 0950 .  heparin injection 5,000 Units, 5,000 Units, Subcutaneous, 3 times per day, Ascencion Dike, PA-C .  insulin aspart (novoLOG) injection 0-9 Units, 0-9 Units, Subcutaneous, TID WC, Eugenie Filler, MD, 5 Units at 09/12/15 1219 .  insulin glargine (LANTUS) injection 15 Units, 15 Units, Subcutaneous, Daily, Reyne Dumas, MD, 15 Units at 09/12/15 1443 .  ondansetron (ZOFRAN) tablet 4 mg, 4 mg, Oral, Q6H PRN **OR** ondansetron (ZOFRAN) injection 4 mg, 4 mg, Intravenous, Q6H PRN, Irine Seal V, MD .  oxyCODONE (Oxy IR/ROXICODONE) immediate release tablet 5 mg, 5 mg, Oral, Q4H PRN, Eugenie Filler, MD, 5 mg at 09/10/15 2011 .  pantoprazole (PROTONIX) EC tablet 40 mg, 40 mg, Oral, QHS, Reyne Dumas, MD, 40 mg at 09/11/15 2158 .  sodium chloride 0.9 % injection 3 mL, 3 mL, Intravenous, Q12H, Eugenie Filler, MD, 3 mL at 09/11/15 1217 .  sodium phosphate (FLEET) 7-19 GM/118ML enema 1 enema, 1 enema, Rectal, Once PRN, Irine Seal V, MD .  sorbitol 70 % solution 30 mL, 30 mL, Oral, Daily PRN, Eugenie Filler, MD    Family History  Problem Relation Age of Onset  . Diabetes Mellitus II Mother   . Pneumonia Father   . Diabetes Father     Social History  Social History  . Marital Status: Married    Spouse Name: N/A  . Number of Children: N/A  . Years of Education: N/A   Social History Main Topics  . Smoking status: Never Smoker   . Smokeless tobacco: None  . Alcohol Use: No  . Drug Use: No  . Sexual Activity: Yes   Other Topics Concern  . None   Social History Narrative    Review of Systems: A 12 point ROS discussed and pertinent positives are indicated in the HPI above.  All other systems are negative.  Review of Systems  Vital Signs: BP 129/79 mmHg   Pulse 79  Temp(Src) 97.8 F (36.6 C) (Oral)  Resp 17  Ht $R'5\' 5"'Bt$  (1.651 m)  Wt 173 lb (78.472 kg)  BMI 28.79 kg/m2  SpO2 98%  Physical Exam  Constitutional: He is oriented to person, place, and time. He appears well-developed and well-nourished. No distress.  HENT:  Head: Normocephalic.  Mouth/Throat: Oropharynx is clear and moist.  Neck: Normal range of motion. No JVD present. No tracheal deviation present.  Cardiovascular: Normal rate, regular rhythm and normal heart sounds.   Pulmonary/Chest: Effort normal and breath sounds normal. No respiratory distress.  Abdominal: Soft. Bowel sounds are normal. There is no tenderness.  Neurological: He is alert and oriented to person, place, and time.  Skin: He is not diaphoretic.  Psychiatric: He has a normal mood and affect. Judgment normal.    Mallampati Score:  MD Evaluation Airway: WNL Heart: WNL Abdomen: WNL Chest/ Lungs: WNL ASA  Classification: 3 Mallampati/Airway Score: Two  Imaging:  Ct Biopsy  09/08/2015  CLINICAL DATA:  Left retroperitoneal mass.  Left testicular mass. EXAM: CT-GUIDED BIOPSY LEFT RETROPERITONEAL MASS.  CORE. MEDICATIONS AND MEDICAL HISTORY: Versed 1 mg, Fentanyl 25 mcg. Additional Medications: None. ANESTHESIA/SEDATION: Moderate sedation time: 6 minutes PROCEDURE: The procedure, risks, benefits, and alternatives were explained to the patient. Questions regarding the procedure were encouraged and answered. The patient understands and consents to the procedure. The back was prepped with ChloraPrep in a sterile fashion, and a sterile drape was applied covering the operative field. A sterile gown and sterile gloves were used for the procedure. Under CT guidance, a(n) 17 gauge guide needle was advanced into the left retroperitoneal mass. Subsequently 3 18 gauge core biopsies were obtained. The guide needle was removed. Final imaging was performed. Patient tolerated the procedure well without complication. Vital sign  monitoring by nursing staff during the procedure will continue as patient is in the special procedures unit for post procedure observation. FINDINGS: The images document guide needle placement within the left retroperitoneal mass. Post biopsy images demonstrate no hemorrhage. COMPLICATIONS: None IMPRESSION: Successful CT-guided core biopsy of a left retroperitoneal mass. Electronically Signed   By: Marybelle Killings M.D.   On: 09/08/2015 16:50     Labs:  CBC:  Recent Labs  09/06/15 0242 09/07/15 0917 09/08/15 0404 09/09/15 0353  WBC 4.2 4.3 3.8* 5.8  HGB 9.8* 9.3* 9.2* 10.0*  HCT 30.7* 28.6* 28.1* 30.6*  PLT 205 180 189 229    COAGS:  Recent Labs  09/05/15 1500  INR 1.26  APTT 32    BMP:  Recent Labs  09/09/15 0353 09/10/15 0427 09/11/15 0258 09/12/15 0242  NA 138 139 137 138  K 4.3 4.4 4.6 4.4  CL 102 104 104 107  CO2 24 20* 23 18*  GLUCOSE 248* 264* 243* 281*  BUN 38* 45* 45* 48*  CALCIUM 8.3* 8.1* 7.5* 7.5*  CREATININE 3.14* 2.93* 2.52* 2.35*  GFRNONAA 19* 21* 25* 27*  GFRAA 22* 24* 29* 32*    LIVER FUNCTION TESTS:  Recent Labs  09/06/15 0242 09/07/15 0917 09/08/15 0404 09/09/15 0353  BILITOT 1.0 1.0 0.8 0.7  AST 34 41 35 25  ALT $Re'19 20 22 18  'MvF$ ALKPHOS 61 64 63 54  PROT 6.3* 6.6 6.5 6.8  ALBUMIN 2.4* 2.4* 2.4* 2.3*    TUMOR MARKERS:  Recent Labs  09/07/15 1616  AFPTM 1.0    Assessment and Plan: Lymphoma Plan for CT guided bone marrow biopsy tomorrow. Pt to be transferred to [facility] tonight. NPO p  MN Coordinated with [facility] CT and cytology for procedure to be done at ~1200 Risks and Benefits discussed with the patient including, but not limited to bleeding, infection, damage to adjacent structures or low yield requiring additional tests. All of the patient's questions were answered, patient is agreeable to proceed. Consent signed and in chart.   SignedAscencion Dike 09/12/2015, 3:33 PM   I spent a total of 20 Minutes in face to face in  clinical consultation, greater than 50% of which was counseling/coordinating care for bone marrow biopsy.

## 2015-09-12 NOTE — Progress Notes (Signed)
Transfer Note:  Patient alert and oriented X 4 with left sided weakness from a previous CVA. Patient is in no apparent distress and VSS.  Left forearm PIV patent. Normal Sinus Rhythm in the 80s noted on trasport monitor. [facility] will transport patient to [facility]. Wife will accompany patient.  Report called to Shirlee Limerick, RN.

## 2015-09-12 NOTE — Care Management Important Message (Signed)
Important Message  Patient Details  Name: Carlos Black MRN: [id number] Date of Birth: 1951-03-05   Medicare Important Message Given:  Yes-third notification given    Nathen May 09/12/2015, 12:53 PM

## 2015-09-12 NOTE — Progress Notes (Signed)
Utilization review completed.  

## 2015-09-12 NOTE — Progress Notes (Signed)
[facility] PROGRESS NOTE  Carlos Black PPI:[id number] DOB: [birth year] DOA: 09/05/2015 PCP: [name], MD  Length of stay: 7   Assessment/Plan: Principal Problem:   SIRS (systemic inflammatory response syndrome) (HCC) Active Problems:   Diabetes mellitus (HCC)   Weakness   Hypotension   Hyperkalemia   ARF (acute renal failure) (HCC)   Dehydration   Left sided numbness   H/O: CVA (cerebrovascular accident)   CAD (coronary artery disease)   Oral thrush   Dysphagia   Anemia   Hyponatremia   Weakness of left side of body   Pelvic mass in male   Back pain    #1 Tumor fever vs UTI  Fever resolution likely secondary to steroids. Patient on admission  noted to have a fever 101.4, secondary to UTI. tachycardic, tachypneic, and borderline hypotension. Chest x-ray negative for any acute infiltrates. Lactic acid level at 1.99. Patient does have a UTI  Blood culture negative, initially placed on broad-spectrum antibiotics which was discontinued on 10/15, Negative Panculture.   pro-calcitonin 0.86.  Patient did not meet sepsis criteria on admission  CT chest abdomen pelvis  Bulky left-sided retroperitoneal mass which is obstructing the left ureter and causing left-sided hydronephrosis,bilateral adrenal gland masses,aggressive lymphoma, retroperitoneal sarcoma or possibly testicular carcinoma We will DC Rocephin, because urine culture with insignificant growth    #2 left lower extremity numbness and weakness/left-sided retroperitoneal mass  Patient has large left retroperitoneal mass found in MRI of the L-spine .   Patient with prior history of CVA risk factors of diabetes hypertension coronary artery disease.    MRI negative for acute CVA , shows old right pontine infarct MRI of the lumbar spine on 10/12 showed a large left retroperitoneal mass with mass effect on the left psoas muscle,  Suspected diagnosis of lymphoma or seminoma Switch  IV Decadron to oral  Decadron and continue given symptomatic improvement in the left lower extremity weakness Dr. Rana Snare, MD and Dr Learta Codding to make further recommendations Preliminary diagnosis consistent with lymphoma,subtyping of the lymphoma is pending Transfer patient to [facility] in anticipation of beginning systemic therapy for lymphoma within the next few days  #3 acute renal failure, improving In the setting of left-sided hydronephrosis, bladder outlet obstruction secondary to large prostrate, continue IV fluids and monitor urine output.  Discussed with urology about the patient's left retroperitoneal mass, [name] on 10/13. He  reviewed the MRI and stated that there was no evidence of extrinsic compression on the kidney resulting in renal failure based on the MRI However CT abdomen pelvis shows left-sided hydronephrosis Discussed with [name] on 10/14 . He suspected the patient has chronic renal insufficiency and the left-sided hydronephrosis may not be contributing  much to the patient's renal failure, patient likely has chronic kidney disease secondary to diabetes Renal function slowly improving 3.24> 2.5>2.35 Continue gentle IV hydration Transfer to [facility] today, patient may need left radical orchiectomy along with cystoscopy and retrograde pyelogram with placement of a stent   Nonsustained ventricular tachycardia-2-D echo within normal limits, EF 60-65% with cardiac enzymes negative 2  Testicular mass, tumor markers negative, 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    Anemia In the setting of renal disease, possible neoplasia, hemoglobin stable around 10  Chronic kidney disease stage IV-creatinine of 3.14, GFR less than 30 likely his baseline, but some  imrpovement>2.52>2.35  #4 hyperkalemia Resolved  #6  oral  thrush Continue diflucan.  #7 dysphagia Consult speech therapy for swallow evaluation.    #8 hyponatremia Likely secondary to hypovolemic hyponatremia , improving  #9 diabetes mellitus   hemoglobin A1c 6.2 . Currently has steroid-induced hyperglycemia. Continue sliding scale insulin. Hold by mouth hypoglycemics  #10 history of CVA No acute CVA this admission  #11 hyperlipidemia Once tolerating oral intake will resume home regimen of statin.  #12 hypertension Hold antihypertensives for now. Follow.   DVT prophylaxsis heparin  Code Status:      Code Status Orders        Start     Ordered   09/05/15 1958  Do not attempt resuscitation (DNR)   Continuous    Question Answer Comment  In the event of cardiac or respiratory ARREST Do not call a "code blue"   In the event of cardiac or respiratory ARREST Do not perform Intubation, CPR, defibrillation or ACLS   In the event of cardiac or respiratory ARREST Use medication by any route, position, wound care, and other measures to relive pain and suffering. May use oxygen, suction and manual treatment of airway obstruction as needed for comfort.      09/05/15 1957     Family Communication: family updated about patient's clinical progress Disposition Plan: Transfer patient to [facility]     Brief narrative: 64 y o with history of CVA with residual left sided weakness, DM, HTN, CAD who presented to ED with sudden onset of left lower extremity numbness and left-sided weakness on the day of admission. Patient stated that he woke up around 10 or 11 AM went to try to use the commode when he noticed a numbness in his left leg. Patient stated that he was extremely weak and unable to get off the commode and had to have 3 people help him up. Patient also endorses a cough or caring mostly at night over the past 2-3 weeks which is productive in nature with some brownish sputum. Patient denies any fevers, no chills, no nausea, no chest  pain, no emesis, no shortness of breath, no abdominal pain, no diarrhea, no constipation, no dysuria, no melena, no hematemesis, no hematochezia, no visual changes, no facial droop, no slurred speech. Patient was in the emergency room CT head which was done was negative. Patient was noted in the ED to have systolic blood pressures in the 90s with a temp of 101.4 respiratory rate in the 30s and tachycardic with heart rate up to 109. Patient was given a liter bolus. Comprehensive metabolic profile obtained had a sodium of 129 potassium of 5.9 chloride of 93 BUN of 37 creatinine of 3.44 albumin of 3.0 AST of 50 bilirubin of 1.3 otherwise was within normal limits. Lactic acid level was 1.99. CBC had a hemoglobin of 11.2 otherwise was within normal limits. Chest x-ray which was done was negative for any acute infiltrate. Urinalysis was pending. Blood cultures were drawn. Patient was given a dose of IV vancomycin and IV Zosyn. [facility] were called to admit the patient for further evaluation and management.  Consultants:  None   Procedures None  Antibiotics: Anti-infectives    Start     Dose/Rate Route Frequency Ordered Stop   09/11/15 1000  fluconazole (DIFLUCAN) tablet 100 mg     100 mg Oral Daily 09/10/15 1258     09/09/15 1130  cefTRIAXone (ROCEPHIN) 1 g in dextrose 5 % 50 mL IVPB  Status:  Discontinued     1 g 100 mL/hr over 30 Minutes Intravenous Every 24 hours 09/09/15 1123 09/11/15 1355   09/07/15 1800  vancomycin (VANCOCIN) IVPB 750 mg/150 ml premix  Status:  Discontinued     750 mg 150 mL/hr over 60 Minutes Intravenous Every 24 hours 09/07/15 1141 09/09/15 1123   09/07/15 1730  vancomycin (VANCOCIN) IVPB 1000 mg/200 mL premix  Status:  Discontinued     1,000 mg 200 mL/hr over 60 Minutes Intravenous Every 48 hours 09/05/15 1712 09/07/15 1141   09/06/15 2200  piperacillin-tazobactam (ZOSYN) IVPB 3.375 g  Status:  Discontinued     3.375 g 12.5 mL/hr over 240 Minutes Intravenous 3  times per day 09/06/15 1304 09/09/15 1123   09/06/15 1000  fluconazole (DIFLUCAN) IVPB 100 mg  Status:  Discontinued     100 mg 50 mL/hr over 60 Minutes Intravenous Every 24 hours 09/05/15 1947 09/10/15 1258   09/05/15 2200  fluconazole (DIFLUCAN) IVPB 200 mg     200 mg 100 mL/hr over 60 Minutes Intravenous  Once 09/05/15 1946 09/05/15 2316   09/05/15 1800  piperacillin-tazobactam (ZOSYN) IVPB 3.375 g  Status:  Discontinued     3.375 g 100 mL/hr over 30 Minutes Intravenous  Once 09/05/15 1758 09/05/15 1800   09/05/15 1800  vancomycin (VANCOCIN) IVPB 1000 mg/200 mL premix  Status:  Discontinued     1,000 mg 200 mL/hr over 60 Minutes Intravenous  Once 09/05/15 1758 09/05/15 1759   09/05/15 1730  piperacillin-tazobactam (ZOSYN) IVPB 2.25 g  Status:  Discontinued     2.25 g 100 mL/hr over 30 Minutes Intravenous 4 times per day 09/05/15 1712 09/06/15 1304   09/05/15 1730  vancomycin (VANCOCIN) IVPB 1000 mg/200 mL premix  Status:  Discontinued     1,000 mg 200 mL/hr over 60 Minutes Intravenous  Once 09/05/15 1712 09/05/15 1715   09/05/15 1730  vancomycin (VANCOCIN) 1,500 mg in sodium chloride 0.9 % 500 mL IVPB     1,500 mg 250 mL/hr over 120 Minutes Intravenous  Once 09/05/15 1715 09/05/15 2026         HPI/Subjective: Hemodynamically stable, awaiting further recommendations  Objective: Filed Vitals:   09/11/15 0600 09/11/15 1407 09/11/15 2046 09/12/15 0442  BP:  124/76 129/81 120/70  Pulse:  76 77 72  Temp:  97.6 F (36.4 C) 98.1 F (36.7 C) 97.9 F (36.6 C)  TempSrc:  Oral Oral Oral  Resp:  19 20 18   Height:      Weight: 75.751 kg (167 lb)   78.472 kg (173 lb)  SpO2:  96% 96% 93%    Intake/Output Summary (Last 24 hours) at 09/12/15 1333 Last data filed at 09/12/15 1200  Gross per 24 hour  Intake   1375 ml  Output    901 ml  Net    474 ml    Exam:  General: No acute respiratory distress Lungs: Clear to auscultation bilaterally without wheezes or  crackles Cardiovascular: Regular rate and rhythm without murmur gallop or rub normal S1 and S2 Abdomen: Nontender, nondistended, soft, bowel sounds positive, no rebound, no ascites, no appreciable mass Extremities: No significant cyanosis, clubbing, or edema bilateral lower extremities     Data Review   Micro Results Recent Results (from the past 240 hour(s))  Blood culture (routine x 2)     Status: None   Collection Time: 09/05/15  3:00 PM  Result Value Ref Range Status   Specimen Description BLOOD RIGHT ARM  Final  Special Requests BOTTLES DRAWN AEROBIC AND ANAEROBIC 5CC  Final   Culture NO GROWTH 5 DAYS  Final   Report Status 09/10/2015 FINAL  Final  Blood culture (routine x 2)     Status: None   Collection Time: 09/05/15  4:43 PM  Result Value Ref Range Status   Specimen Description BLOOD LEFT HAND  Final   Special Requests BOTTLES DRAWN AEROBIC AND ANAEROBIC 4CC  Final   Culture NO GROWTH 5 DAYS  Final   Report Status 09/10/2015 FINAL  Final  MRSA PCR Screening     Status: None   Collection Time: 09/05/15  8:28 PM  Result Value Ref Range Status   MRSA by PCR NEGATIVE NEGATIVE Final    Comment:        The GeneXpert MRSA Assay (FDA approved for NASAL specimens only), is one component of a comprehensive MRSA colonization surveillance program. It is not intended to diagnose MRSA infection nor to guide or monitor treatment for MRSA infections.   Urine culture     Status: None   Collection Time: 09/05/15 10:20 PM  Result Value Ref Range Status   Specimen Description URINE, RANDOM  Final   Special Requests NONE  Final   Culture 3,000 COLONIES/mL INSIGNIFICANT GROWTH  Final   Report Status 09/07/2015 FINAL  Final  Culture, blood (routine x 2)     Status: None   Collection Time: 09/07/15  9:17 AM  Result Value Ref Range Status   Specimen Description BLOOD RIGHT ANTECUBITAL  Final   Special Requests BOTTLES DRAWN AEROBIC AND ANAEROBIC 5CC 10CC  Final   Culture NO  GROWTH 5 DAYS  Final   Report Status 09/12/2015 FINAL  Final  Culture, blood (routine x 2)     Status: None   Collection Time: 09/07/15  9:27 AM  Result Value Ref Range Status   Specimen Description BLOOD RIGHT HAND  Final   Special Requests BOTTLES DRAWN AEROBIC AND ANAEROBIC 10CC  Final   Culture NO GROWTH 5 DAYS  Final   Report Status 09/12/2015 FINAL  Final    Radiology Reports Ct Abdomen Pelvis Wo Contrast  09/07/2015  CLINICAL DATA:  Evaluate retroperitoneal mass seen on recent MRI of the lumbar spine. EXAM: CT CHEST, ABDOMEN AND PELVIS WITHOUT CONTRAST TECHNIQUE: Multidetector CT imaging of the chest, abdomen and pelvis was performed following the standard protocol without IV contrast. COMPARISON:  Lumbar spine MRI 09/06/2015 and renal ultrasound 09/06/2015 FINDINGS: CT CHEST FINDINGS Mediastinum/Lymph Nodes: No chest wall mass, supraclavicular or axillary lymphadenopathy. Small scattered lymph nodes are noted. The thyroid gland is grossly normal. The heart is normal in size. No pericardial effusion. The aorta is normal in caliber. Mild tortuosity and scattered atherosclerotic calcifications. There are extensive 3 vessel coronary artery calcifications. Moderate distal esophageal wall thickening. Could not exclude neoplasm or esophagitis. No paraesophageal lymphadenopathy. Lungs/Pleura: Limited examination due to breathing motion artifact. There is a small left pleural effusion and areas of atelectasis. Rounded pleural density noted posteriorly at the right lung apex measures -45 Hounsfield units and is probably a small benign pleural lipoma. No worrisome pulmonary lesions to suggest a primary lung neoplasm or metastatic disease. Musculoskeletal: No significant bony findings. CT ABDOMEN PELVIS FINDINGS Hepatobiliary: No obvious hepatic lesions without contrast. Mild hepatomegaly. The gallbladder is distended and there are small gallstones noted. Pancreas: No pancreatic mass, inflammation or  ductal dilatation. Spleen: Upper limits of normal.  No focal lesions. Adrenals/Urinary Tract: Bilateral adrenal gland masses. The right measures a maximum  of 37.5 mm and the left 31 mm. No obvious renal mass. There is left-sided hydronephrosis due to a large left-sided retroperitoneal mass which is surrounding the left ureter and lower pole region of the left kidney. I do not see a fat plane between this mass and the aorta. It extends down into the upper left pelvis. This could reflect aggressive lymphoma or retroperitoneal sarcoma. The mass measures approximately 16.4 x 9.7 x 8.7 cm. Recommend image guided biopsy. PET-CT may also be helpful for further evaluation and accurate staging. Stomach/Bowel: The stomach, duodenum, small bowel and colon are grossly normal. No inflammatory changes, mass lesions or obstructive findings. The terminal ileum is normal. Vascular/Lymphatic: Large left-sided retroperitoneal mass as discussed above. Moderate atherosclerotic calcifications involving the aorta but no aneurysm. Reproductive: The bladder, prostate gland and seminal vesicles are unremarkable. Other: 3.6 x 2.1 cm internal iliac nodal mass on the left side. Small amount of free the pelvic fluid and presacral edema. Bilateral inguinal hernias containing fat and fluid and small bowel on the right side. Suspect undescended testicle in the left inguinal canal. This would raise the possibility of testicular cancer. No inguinal adenopathy. Musculoskeletal: No significant bony findings. IMPRESSION: 1. Bulky left-sided retroperitoneal mass which is obstructing the left ureter and causing left-sided hydronephrosis. There are also bilateral adrenal gland masses. Findings could be due to aggressive lymphoma, retroperitoneal sarcoma or possibly testicular carcinoma given the fact that it appears the patients left testicle is in the left inguinal canal. No obvious testicular mass. Scrotal ultrasound may be helpful for further  evaluation. Image guided Biopsy is recommended of the left retroperitoneal mass. 2. No significant findings in the chest. No evidence of a primary lung neoplasm or metastatic pulmonary disease. No mediastinal or hilar mass or adenopathy. 3. Extensive 3 vessel coronary artery calcifications. 4. Small left pleural effusion. 5. Distended gallbladder and cholelithiasis. 6. Bilateral inguinal hernias containing fat, fluid and small bowel on the right side. Electronically Signed   By: Marijo Sanes M.D.   On: 09/07/2015 13:35   Dg Chest 2 View  09/05/2015  CLINICAL DATA:  64 year old with acute onset of left lower extremity paresis/numbness which began earlier today. Fever. Current history of hypertension and diabetes. Prior stroke and MI. EXAM: CHEST  2 VIEW COMPARISON:  07/26/2004, 07/20/2004. FINDINGS: AP semi-erect and lateral images were obtained. Chronic elevation of the right hemidiaphragm with chronic scar/atelectasis involving the right lower lobe and right middle lobe, unchanged. Lungs otherwise clear. No localized airspace consolidation. No pleural effusions. No pneumothorax. Normal pulmonary vascularity. Cardiac silhouette upper normal in size for AP technique. Thoracic aorta mildly atherosclerotic and tortuous, unchanged. Hilar and mediastinal contours otherwise unremarkable. Visualized bony thorax intact with only mild degenerative changes in the mid thoracic region. IMPRESSION: 1.  No acute cardiopulmonary disease. 2. Stable chronic elevation of the right hemidiaphragm and chronic scar/atelectasis involving the right lower lobe and right middle lobe. Electronically Signed   By: Evangeline Dakin M.D.   On: 09/05/2015 15:26   Ct Head Wo Contrast  09/05/2015  CLINICAL DATA:  Left-sided weakness from previous CVA, weakness has increased over the last day with difficulty walking EXAM: CT HEAD WITHOUT CONTRAST TECHNIQUE: Contiguous axial images were obtained from the base of the skull through the vertex  without intravenous contrast. COMPARISON:  12/21/2011 FINDINGS: The bony calvarium is intact. Atrophic changes are noted. No findings to suggest acute hemorrhage, acute infarction or space-occupying mass lesion are noted. IMPRESSION: Atrophic changes without acute abnormality. Electronically Signed  By: Inez Catalina M.D.   On: 09/05/2015 16:37   Ct Chest Wo Contrast  09/07/2015  CLINICAL DATA:  Evaluate retroperitoneal mass seen on recent MRI of the lumbar spine. EXAM: CT CHEST, ABDOMEN AND PELVIS WITHOUT CONTRAST TECHNIQUE: Multidetector CT imaging of the chest, abdomen and pelvis was performed following the standard protocol without IV contrast. COMPARISON:  Lumbar spine MRI 09/06/2015 and renal ultrasound 09/06/2015 FINDINGS: CT CHEST FINDINGS Mediastinum/Lymph Nodes: No chest wall mass, supraclavicular or axillary lymphadenopathy. Small scattered lymph nodes are noted. The thyroid gland is grossly normal. The heart is normal in size. No pericardial effusion. The aorta is normal in caliber. Mild tortuosity and scattered atherosclerotic calcifications. There are extensive 3 vessel coronary artery calcifications. Moderate distal esophageal wall thickening. Could not exclude neoplasm or esophagitis. No paraesophageal lymphadenopathy. Lungs/Pleura: Limited examination due to breathing motion artifact. There is a small left pleural effusion and areas of atelectasis. Rounded pleural density noted posteriorly at the right lung apex measures -45 Hounsfield units and is probably a small benign pleural lipoma. No worrisome pulmonary lesions to suggest a primary lung neoplasm or metastatic disease. Musculoskeletal: No significant bony findings. CT ABDOMEN PELVIS FINDINGS Hepatobiliary: No obvious hepatic lesions without contrast. Mild hepatomegaly. The gallbladder is distended and there are small gallstones noted. Pancreas: No pancreatic mass, inflammation or ductal dilatation. Spleen: Upper limits of normal.  No focal  lesions. Adrenals/Urinary Tract: Bilateral adrenal gland masses. The right measures a maximum of 37.5 mm and the left 31 mm. No obvious renal mass. There is left-sided hydronephrosis due to a large left-sided retroperitoneal mass which is surrounding the left ureter and lower pole region of the left kidney. I do not see a fat plane between this mass and the aorta. It extends down into the upper left pelvis. This could reflect aggressive lymphoma or retroperitoneal sarcoma. The mass measures approximately 16.4 x 9.7 x 8.7 cm. Recommend image guided biopsy. PET-CT may also be helpful for further evaluation and accurate staging. Stomach/Bowel: The stomach, duodenum, small bowel and colon are grossly normal. No inflammatory changes, mass lesions or obstructive findings. The terminal ileum is normal. Vascular/Lymphatic: Large left-sided retroperitoneal mass as discussed above. Moderate atherosclerotic calcifications involving the aorta but no aneurysm. Reproductive: The bladder, prostate gland and seminal vesicles are unremarkable. Other: 3.6 x 2.1 cm internal iliac nodal mass on the left side. Small amount of free the pelvic fluid and presacral edema. Bilateral inguinal hernias containing fat and fluid and small bowel on the right side. Suspect undescended testicle in the left inguinal canal. This would raise the possibility of testicular cancer. No inguinal adenopathy. Musculoskeletal: No significant bony findings. IMPRESSION: 1. Bulky left-sided retroperitoneal mass which is obstructing the left ureter and causing left-sided hydronephrosis. There are also bilateral adrenal gland masses. Findings could be due to aggressive lymphoma, retroperitoneal sarcoma or possibly testicular carcinoma given the fact that it appears the patients left testicle is in the left inguinal canal. No obvious testicular mass. Scrotal ultrasound may be helpful for further evaluation. Image guided Biopsy is recommended of the left  retroperitoneal mass. 2. No significant findings in the chest. No evidence of a primary lung neoplasm or metastatic pulmonary disease. No mediastinal or hilar mass or adenopathy. 3. Extensive 3 vessel coronary artery calcifications. 4. Small left pleural effusion. 5. Distended gallbladder and cholelithiasis. 6. Bilateral inguinal hernias containing fat, fluid and small bowel on the right side. Electronically Signed   By: Marijo Sanes M.D.   On: 09/07/2015 13:35  Mr Brain Wo Contrast  09/06/2015  CLINICAL DATA:  LEFT lower extremity numbness and LEFT-sided weakness for 2 days. Night cough for 2-3 weeks. History of stroke and LEFT-sided weakness, diabetes, hypertension, coronary artery disease. EXAM: MRI HEAD WITHOUT CONTRAST TECHNIQUE: Multiplanar, multiecho pulse sequences of the brain and surrounding structures were obtained without intravenous contrast. COMPARISON:  CT head September 05, 2015 at 1626 hours FINDINGS: Multiple sequences are mild or moderately motion degraded. No reduced diffusion to suggest acute ischemia. No susceptibility artifact to suggest hemorrhage. Ventricles and sulci are normal for patient's age. Old RIGHT ventral pons infarct. Old RIGHT basal ganglia lacunar infarct. Mild RIGHT cerebral peduncle volume loss compatible with wallerian degeneration. Mild white matter changes compatible with chronic small vessel ischemic disease, decreased sensitivity due to patient motion. No abnormal extra-axial fluid collections. Dolicoectatic intracranial vessel flow voids seen at the skull base. Ocular globes and orbital contents are grossly normal though motion degraded. Paranasal sinuses and mastoid air cells are well aerated. No abnormal sellar expansion. No is abnormal cerebellar tonsillar descent below the foramen magnum. No suspicious bone marrow signal. IMPRESSION: No acute intracranial process on this motion degraded examination. Involutional changes and mild chronic small vessel ischemic  disease. Old RIGHT pontine infarct. Old RIGHT basal ganglia lacunar infarct. Electronically Signed   By: Elon Alas M.D.   On: 09/06/2015 02:55   Mr Lumbar Spine Wo Contrast  09/06/2015  CLINICAL DATA:  LEFT lower extremity numbness and LEFT-sided weakness for 2 days. History of stroke in LEFT-sided weakness, diabetes, hypertension, coronary artery disease. EXAM: MRI LUMBAR SPINE WITHOUT CONTRAST TECHNIQUE: Multiplanar, multisequence MR imaging of the lumbar spine was performed. No intravenous contrast was administered. COMPARISON:  None. FINDINGS: Lumbar vertebral bodies and posterior elements are intact and aligned with maintenance of lumbar lordosis. Intervertebral discs demonstrate normal morphology, with slight decreased T2 signal within the lower lumbar disc consistent with mild desiccation. Minimal subacute discogenic endplate changes at all lumbar levels, no STIR signal abnormality to suggest acute osseous process. Conus medullaris terminates at L1 and appears normal in morphology and signal characteristics. Limited assessment of cauda equina due to patient motion. LEFT abdominal mass partially imaged, with mass effect on the LEFT psoas muscle. Probable lymphadenopathy along LEFT Common iliac chain. Probable obstructive uropathy on the LEFT. Partially imaged sub cm gallstone. Level by level evaluation: L1-2 and L2-3: No significant disc bulge, canal stenosis or neural foraminal narrowing. L3-4: Small broad-based disc bulge, mild facet and ligamentum flavum redundancy without canal stenosis. Minimal bilateral neural foraminal narrowing. L4-5: Small broad-based disc bulge asymmetric to LEFT. Mild facet arthropathy and ligamentum flavum redundancy without canal stenosis. Minimal LEFT neural foraminal narrowing. L5-S1: No disc bulge, canal stenosis nor neural foraminal narrowing. IMPRESSION: No acute lumbar spine fracture nor malalignment. No canal stenosis. Minimal L3-4 and L4-5 neural foraminal  narrowing. Large LEFT retroperitoneal mass with mass effect on the LEFT psoas muscle and probable lymphadenopathy along LEFT iliac chain. Recommend CT of the abdomen and pelvis with contrast. Mass appears to result in LEFT obstructive uropathy. Electronically Signed   By: Elon Alas M.D.   On: 09/06/2015 23:18   US Scrotum  09/07/2015  CLINICAL DATA:  64 year old male with palpable testicular mass. EXAM: ULTRASOUND OF SCROTUM TECHNIQUE: Complete ultrasound examination of the testicles, epididymis, and other scrotal structures was performed. COMPARISON:  None. FINDINGS: Right testicle Measurements: 4.5 x 2.6 x 2 cm. No mass or microlithiasis visualized. Left testicle Measurements: 4.4 x 3.4 x 2.9 cm. A 2.2 x  3.3 x 2.3 cm oval hypoechoic area within the left testicle is noted with some vascular flow. No other abnormalities identified. Right epididymis:  Not visualized Left epididymis:  Normal in size and appearance. Hydrocele:  Small - moderate bilateral hydroceles noted. Varicocele:  Mild left varicocele noted. IMPRESSION: 2.2 x 3.3 x 2.3 cm oval hypoechoic area within the left testicle suspicious for testicular mass/neoplasm. Urology consultation is recommended. Unremarkable right testicle. Small to moderate bilateral hydroceles. Electronically Signed   By: Margarette Canada M.D.   On: 09/07/2015 20:02   US Renal  09/06/2015  CLINICAL DATA:  Acute renal failure. EXAM: RENAL / URINARY TRACT ULTRASOUND COMPLETE COMPARISON:  None. FINDINGS: Right Kidney: Length: 9.6 cm. Heterogeneous increased parenchymal echotexture with parenchymal thinning suggesting chronic medical renal disease. No hydronephrosis or solid mass. Left Kidney: Length: 9.8 cm. Heterogeneous increased parenchymal echotexture likely indicating chronic medical renal disease. Appears to be circumscribed hypoechoic appearance of the lower pole of the left kidney. This may indicate edema versus abscess. No hydronephrosis or solid mass identified.  Bladder: No bladder wall thickening or filling defect. Bilateral urine flow jets are demonstrated on color flow Doppler imaging. Incidental note of mildly enlarged prostate gland measuring 3.6 x 3.6 x 3.3 cm. IMPRESSION: Heterogeneous parenchymal echotexture bilaterally suggesting chronic medical renal disease. Hypoechoic appearance of the lower pole left kidney could indicate edema, pyelonephritis, or abscess. No hydronephrosis in either kidney. Mild prostate enlargement. Electronically Signed   By: Lucienne Capers M.D.   On: 09/06/2015 03:19   Ct Biopsy  09/08/2015  CLINICAL DATA:  Left retroperitoneal mass.  Left testicular mass. EXAM: CT-GUIDED BIOPSY LEFT RETROPERITONEAL MASS.  CORE. MEDICATIONS AND MEDICAL HISTORY: Versed 1 mg, Fentanyl 25 mcg. Additional Medications: None. ANESTHESIA/SEDATION: Moderate sedation time: 6 minutes PROCEDURE: The procedure, risks, benefits, and alternatives were explained to the patient. Questions regarding the procedure were encouraged and answered. The patient understands and consents to the procedure. The back was prepped with ChloraPrep in a sterile fashion, and a sterile drape was applied covering the operative field. A sterile gown and sterile gloves were used for the procedure. Under CT guidance, a(n) 17 gauge guide needle was advanced into the left retroperitoneal mass. Subsequently 3 18 gauge core biopsies were obtained. The guide needle was removed. Final imaging was performed. Patient tolerated the procedure well without complication. Vital sign monitoring by nursing staff during the procedure will continue as patient is in the special procedures unit for post procedure observation. FINDINGS: The images document guide needle placement within the left retroperitoneal mass. Post biopsy images demonstrate no hemorrhage. COMPLICATIONS: None IMPRESSION: Successful CT-guided core biopsy of a left retroperitoneal mass. Electronically Signed   By: Marybelle Killings M.D.   On:  09/08/2015 16:50   Portable Chest 1 View  09/06/2015  CLINICAL DATA:  Fever. EXAM: PORTABLE CHEST 1 VIEW COMPARISON:  09/05/2015.  07/20/2004. FINDINGS: Mediastinum and hilar structures are normal. Heart size stable. Persistent right lower lobe subsegmental atelectasis and/or scarring. No pleural effusion pneumothorax. IMPRESSION: Stable right base subsegmental atelectasis and/or scarring. No acute cardiopulmonary disease. Electronically Signed   By: Marcello Moores  Register   On: 09/06/2015 07:19     CBC  Recent Labs Lab 09/05/15 1500 09/06/15 0242 09/07/15 0917 09/08/15 0404 09/09/15 0353  WBC 5.9 4.2 4.3 3.8* 5.8  HGB 11.2* 9.8* 9.3* 9.2* 10.0*  HCT 34.3* 30.7* 28.6* 28.1* 30.6*  PLT 267 205 180 189 229  MCV 75.9* 77.3* 77.3* 77.0* 76.3*  MCH 24.8* 24.7* 25.1* 25.2* 24.9*  MCHC 32.7 31.9 32.5 32.7 32.7  RDW 16.1* 16.2* 16.5* 16.6* 16.7*  LYMPHSABS 0.7  --  0.9  --   --   MONOABS 1.6*  --  0.8  --   --   EOSABS 0.1  --  0.2  --   --   BASOSABS 0.0  --  0.0  --   --     Chemistries   Recent Labs Lab 09/05/15 1500 09/05/15 1837 09/05/15 2043 09/06/15 0242 09/07/15 0917 09/08/15 0404 09/09/15 0353 09/09/15 2148 09/10/15 0427 09/11/15 0258 09/12/15 0242  NA 129*  --  132* 135 135 133* 138  --  139 137 138  K 5.9*  --  5.0 5.0 5.2* 4.3 4.3  --  4.4 4.6 4.4  CL 93*  --  98* 100* 105 98* 102  --  104 104 107  CO2 25  --  23 20* 21* 22 24  --  20* 23 18*  GLUCOSE 150*  --  117* 116* 103* 225* 248*  --  264* 243* 281*  BUN 37*  --  32* 31* 24* 30* 38*  --  45* 45* 48*  CREATININE 3.44*  --  3.24* 3.14* 3.01* 3.19* 3.14*  --  2.93* 2.52* 2.35*  CALCIUM 9.2  --  8.2* 8.4* 8.2* 7.8* 8.3*  --  8.1* 7.5* 7.5*  MG  --  1.6* 1.8 2.3  --   --   --  2.0  --   --   --   AST 50*  --   --  34 41 35 25  --   --   --   --   ALT 21  --   --  19 20 22 18   --   --   --   --   ALKPHOS 79  --   --  61 64 63 54  --   --   --   --   BILITOT 1.3*  --   --  1.0 1.0 0.8 0.7  --   --   --   --     ------------------------------------------------------------------------------------------------------------------ estimated creatinine clearance is 29.9 mL/min (by C-G formula based on Cr of 2.35). ------------------------------------------------------------------------------------------------------------------ No results for input(s): HGBA1C in the last 72 hours. ------------------------------------------------------------------------------------------------------------------ No results for input(s): CHOL, HDL, LDLCALC, TRIG, CHOLHDL, LDLDIRECT in the last 72 hours. ------------------------------------------------------------------------------------------------------------------ No results for input(s): TSH, T4TOTAL, T3FREE, THYROIDAB in the last 72 hours.  Invalid input(s): FREET3 ------------------------------------------------------------------------------------------------------------------ No results for input(s): VITAMINB12, FOLATE, FERRITIN, TIBC, IRON, RETICCTPCT in the last 72 hours.  Coagulation profile  Recent Labs Lab 09/05/15 1500  INR 1.26    No results for input(s): DDIMER in the last 72 hours.  Cardiac Enzymes  Recent Labs Lab 09/05/15 1837  TROPONINI <0.03   ------------------------------------------------------------------------------------------------------------------ Invalid input(s): POCBNP   CBG:  Recent Labs Lab 09/11/15 1114 09/11/15 1605 09/11/15 2043 09/12/15 0633 09/12/15 1109  GLUCAP 304* 348* 267* 243* 285*       Studies: No results found.    Lab Results  Component Value Date   HGBA1C 6.2* 09/05/2015   HGBA1C 13.2* 12/22/2011   Lab Results  Component Value Date   CREATININE 2.35* 09/12/2015       Scheduled Meds: . aspirin  81 mg Oral Daily  . dexamethasone  4 mg Oral Q12H  . feeding supplement (GLUCERNA SHAKE)  237 mL Oral TID BM  . fluconazole  100 mg Oral Daily  . guaiFENesin  1,200 mg Oral BID  .  heparin   5,000 Units Subcutaneous 3 times per day  . insulin aspart  0-9 Units Subcutaneous TID WC  . insulin glargine  15 Units Subcutaneous Daily  . pantoprazole  40 mg Oral QHS  . sodium chloride  3 mL Intravenous Q12H   Continuous Infusions: . sodium chloride 75 mL/hr at 09/11/15 2307    Principal Problem:   SIRS (systemic inflammatory response syndrome) (HCC) Active Problems:   Diabetes mellitus (HCC)   Weakness   Hypotension   Hyperkalemia   ARF (acute renal failure) (HCC)   Dehydration   Left sided numbness   H/O: CVA (cerebrovascular accident)   CAD (coronary artery disease)   Oral thrush   Dysphagia   Anemia   Hyponatremia   Weakness of left side of body   Pelvic mass in male   Back pain    Time spent: 45 minutes   [facility] Pager [phone]. If 7PM-7AM, please contact night-coverage at [email or web address], password [facility] 09/12/2015, 1:33 PM  LOS: 7 days

## 2015-09-12 NOTE — Progress Notes (Signed)
IP PROGRESS NOTE  Subjective:   He has no complaint other than persistent left-sided weakness. He is up in a chair eating breakfast.  Objective: Vital signs in last 24 hours: Blood pressure 120/70, pulse 72, temperature 97.9 F (36.6 C), temperature source Oral, resp. rate 18, height $RemoveBe'5\' 5"'[id number]$  (1.651 m), weight 173 lb (78.472 kg), SpO2 93 %.  Intake/Output from previous day: 10/17 0701 - 10/18 0700 In: 0094 [P.O.:680; I.V.:575; IV Piggyback:50] Out: 751 [Urine:750; Stool:1]  Physical Exam:  Neurologic: 4/5 strength in the left arm and hand, 3/5 strength at the left leg and foot  Lab Results: No results for input(s): WBC, HGB, HCT, PLT in the last 72 hours.  BMET  Recent Labs  09/11/15 0258 09/12/15 0242  NA 137 138  K 4.6 4.4  CL 104 107  CO2 23 18*  GLUCOSE 243* 281*  BUN 45* 48*  CREATININE 2.52* 2.35*  CALCIUM 7.5* 7.5*   AFP-1.0, beta hCG-1  Studies/Results: No results found.  Medications: I have reviewed the patient's current medications.  Assessment/Plan:  1. Left retroperitoneal mass, adrenal masses, left testicular mass, status post a CT-guided biopsy of the left retroperitoneal mass 09/08/2015 2. Renal failure, left hydronephrosis, the creatinine has improved 3. Left hemiplegia-chronic following a CVA 4. Microcytic anemia 5. Fever-resolved on Decadron  Mr. Bradwell appears stable. Preliminary results from the retroperitoneal mass biopsy are consistent with lymphoma. Final histology review with immunohistochemical stains for subtyping of the lymphoma is pending. The testicular mass is likely related to involvement by non-Hodgkin's lymphoma.  Recommendations: 1. Complete staging for lymphoma with a PET scan and bone marrow biopsy 2. Can transferred to [facility] with plan to begin systemic therapy for lymphoma within the next few days 3. I will coordinate management of the testicular mass with [name]  LOS: 7 days   [facility], Dama Hedgepeth  09/12/2015,  1:30 PM

## 2015-09-13 ENCOUNTER — Other Ambulatory Visit: Payer: Self-pay | Admitting: Urology

## 2015-09-13 DIAGNOSIS — Z8673 Personal history of transient ischemic attack (TIA), and cerebral infarction without residual deficits: Secondary | ICD-10-CM

## 2015-09-13 DIAGNOSIS — R1310 Dysphagia, unspecified: Secondary | ICD-10-CM

## 2015-09-13 DIAGNOSIS — C8599 Non-Hodgkin lymphoma, unspecified, extranodal and solid organ sites: Secondary | ICD-10-CM | POA: Diagnosis present

## 2015-09-13 DIAGNOSIS — E875 Hyperkalemia: Secondary | ICD-10-CM

## 2015-09-13 DIAGNOSIS — C8330 Diffuse large B-cell lymphoma, unspecified site: Secondary | ICD-10-CM | POA: Diagnosis present

## 2015-09-13 LAB — BASIC METABOLIC PANEL
Anion gap: 11 (ref 5–15)
BUN: 49 mg/dL — ABNORMAL HIGH (ref 6–20)
CO2: 19 mmol/L — ABNORMAL LOW (ref 22–32)
CREATININE: 2.15 mg/dL — AB (ref 0.61–1.24)
Calcium: 7.6 mg/dL — ABNORMAL LOW (ref 8.9–10.3)
Chloride: 110 mmol/L (ref 101–111)
GFR calc Af Amer: 35 mL/min — ABNORMAL LOW (ref >60)
GFR, EST NON AFRICAN AMERICAN: 30 mL/min — AB (ref >60)
Glucose, Bld: 205 mg/dL — ABNORMAL HIGH (ref 65–99)
POTASSIUM: 4.2 mmol/L (ref 3.5–5.1)
SODIUM: 140 mmol/L (ref 135–145)

## 2015-09-13 LAB — CBC
HEMATOCRIT: 27.2 % — AB (ref 39.0–52.0)
Hemoglobin: 8.7 g/dL — ABNORMAL LOW (ref 13.0–17.0)
MCH: 25.1 pg — ABNORMAL LOW (ref 26.0–34.0)
MCHC: 32 g/dL (ref 30.0–36.0)
MCV: 78.6 fL (ref 78.0–100.0)
PLATELETS: 265 10*3/uL (ref 150–400)
RBC: 3.46 MIL/uL — ABNORMAL LOW (ref 4.22–5.81)
RDW: 17.4 % — AB (ref 11.5–15.5)
WBC: 9.2 10*3/uL (ref 4.0–10.5)

## 2015-09-13 LAB — GLUCOSE, CAPILLARY
Glucose-Capillary: 168 mg/dL — ABNORMAL HIGH (ref 65–99)
Glucose-Capillary: 125 mg/dL — ABNORMAL HIGH (ref 65–99)
Glucose-Capillary: 235 mg/dL — ABNORMAL HIGH (ref 65–99)
Glucose-Capillary: 289 mg/dL — ABNORMAL HIGH (ref 65–99)

## 2015-09-13 LAB — C DIFFICILE QUICK SCREEN W PCR REFLEX
C Diff antigen: NEGATIVE
C Diff interpretation: NEGATIVE
C Diff toxin: NEGATIVE

## 2015-09-13 LAB — URIC ACID: URIC ACID, SERUM: 8.1 mg/dL — AB (ref 4.4–7.6)

## 2015-09-13 MED ORDER — CARVEDILOL 6.25 MG PO TABS: 18.7500 mg | ORAL_TABLET | Freq: Two times a day (BID) | ORAL | Status: DC

## 2015-09-13 MED ORDER — CARVEDILOL 6.25 MG PO TABS: 6.2500 mg | ORAL_TABLET | Freq: Once | ORAL | Status: DC

## 2015-09-13 NOTE — Progress Notes (Addendum)
Nutrition Follow-up  DOCUMENTATION CODES:   Not applicable  INTERVENTION:  - Encourage PO intakes with meals and supplements - Continue Glucerna Shake TID - RD will continue to monitor for needs  NUTRITION DIAGNOSIS:   Increased nutrient needs related to catabolic illness as evidenced by estimated needs. -NEW  GOAL:   Patient will meet greater than or equal to 90% of their needs -met on average  MONITOR:   PO intake, Supplement acceptance, Weight trends, Labs, Skin, I & O's  ASSESSMENT:   Patient is a 64 y o with history of CVA with residual left sided weakness, DM, HTN, CAD who presented to ED with sudden onset of left lower extremity numbness and left-sided weakness on the day of admission.   10/19 Pt previously on Carb Modified diet. He ae 50% breakfast 10/16, 100% breakfast, 10% lunch, and 50% dinner 10/17 and 100% breakfast and 90% dinner yesterday (10/18). He states that his appetite is improving and that he has been feeling hungry before meals are delivered which is an improvement for him. He denies any abdominal pain or nausea with PO intakes. SLP saw pt 10/17 and recommended regular consistency diet with thin liquids; pt denies any chewing or swallowing difficulty since this assessment. Notes indicate pt with thrush but he denies any discomfort with intakes related to this.  He has been drinking Glucerna Shake and likes this supplements. Per review, pt has gained 4 lbs since RD assessment 09/06/15; will continue to monitor weight trends.  Likely meeting needs. Medications reviewed. Labs reviewed; CBGs: 208-348 mg/dL, BUN/creatinine elevated, Ca: 7.6 mg/dL, GFR: 30.  ADDENDUM: Per Radiology PA note 10/18 @ 1532: probable lymphoma. Had biopsy of retroperitoneal mass biopsy a few days ago. He has been seen by oncology, who now request staging bone marrow biopsy as part of workup.  Based on this, kcal and protein needs have been adjusted accordingly.     10/12 - He  reports a decreased appetite for the past 1-2 months, reporting "nothing tastes good".  - He denies any difficulty chewing or swallowing foods and liquids.  - He reveals that he consumes 1-2 meals per day PTA, however, often only eats a small portion of what is served ("maybe a few bites"). - Pt confirms UBW of 170# and denies any weight loss, however, reports that he started wearing suspenders due to his pants becoming too loose.  - Nutrition-Focused physical exam completed. Findings are mild fat depletion, mild muscle depletion, and no edema.  - Noted abdominal distention on exam, which may be masking true weight loss.  - Pt reports struggling with hypoglycemia as of late. He reveals he checks his blood sugars 4 times daily and typically yields readings of 40-80.  - He expresses frustration, as he has been through diabetes education classes twice and his glyburide dosage was recently reduced, but he still remains hypoglycemic.  - Spent most of this visit discussing management of hypoglycemia; discussed ways to add between meal snacks (such as yogurt, peanut butter crackers) to increase nutritional intake and prevent hypoglycemia.  - He recently started taking Glucerna supplements PTA and is amenable to have them once his diet is advanced.   Diet Order:  Diet Carb Modified Fluid consistency:: Thin; Room service appropriate?: Yes  Skin:  Reviewed, no issues  Last BM:  10/19  Height:   Ht Readings from Last 1 Encounters:  09/05/15 $RemoveB'5\' 5"'[id number]$  (1.651 m)    Weight:   Wt Readings from Last 1 Encounters:  09/12/15 173  lb (78.472 kg)    Ideal Body Weight:  61.8 kg  BMI:  Body mass index is 28.79 kg/(m^2).  Estimated Nutritional Needs:   Kcal:  9450-3888  Protein:  80-90 grams  Fluid:  2-2.2 L/day  EDUCATION NEEDS:   Education needs addressed     Jarome Matin, RD, LDN Inpatient Clinical Dietitian Pager # [phone] After hours/weekend pager # [phone]

## 2015-09-13 NOTE — Progress Notes (Addendum)
IP PROGRESS NOTE  Subjective:   He has no new complaint.  Objective: Vital signs in last 24 hours: Blood pressure 130/78, pulse 67, temperature 97.6 F (36.4 C), temperature source Oral, resp. rate 16, height 5\' 5"  (1.651 m), weight 173 lb (78.472 kg), SpO2 97 %.  Intake/Output from previous day: 10/18 0701 - 10/19 0700 In: 3326.3 [P.O.:720; I.V.:2606.3] Out: 2625 [Urine:2625]  Physical Exam: Lungs: Clear anteriorly Cardiac: Regular rate and rhythm Abdomen: No mass, no hepatosplenomegaly Vascular: No leg edema GU: Stable left testicular mass Neurologic: 4/5 strength in the left arm and hand, 3/5 strength at the left leg and foot  Lab Results:  Recent Labs  09/13/15 0600  WBC 9.2  HGB 8.7*  HCT 27.2*  PLT 265    BMET  Recent Labs  09/12/15 0242 09/13/15 0600  NA 138 140  K 4.4 4.2  CL 107 110  CO2 18* 19*  GLUCOSE 281* 205*  BUN 48* 49*  CREATININE 2.35* 2.15*  CALCIUM 7.5* 7.6*   AFP-1.0, beta hCG-1  Studies/Results: No results found.  Medications: I have reviewed the patient's current medications.  Assessment/Plan:  1. Left retroperitoneal mass, adrenal masses, left testicular mass, status post a CT-guided biopsy of the left retroperitoneal mass 09/08/2015-pathology confirmed large B-cell lymphoma, CD20 positive 2. Renal failure, left hydronephrosis, the creatinine has improved 3. Left hemiplegia-chronic following a CVA 4. Microcytic anemia 5. Fever-resolved on Decadron  Carlos Black appears stable. Additional review of the retroperitoneal mass biopsy confirms a diagnosis of large B-cell lymphoma. I recommend treatment with CHOP-rituximab.  Ideally he could complete staging with a PET scan and begin treatment as an outpatient. I discussed the case with [name]. The left orchiectomy and ureter stent placement cannot be scheduled until next week. If he can be discharged to a skilled nursing facility on 09/14/2015 I will arrange for a PET scan  09/18/2015 and chemotherapy next week. If he is not discharged over the next one to 2 days we will plan for inpatient chemotherapy. Recommendations: 1. Check prechemotherapy echocardiogram 2. Care management consult to arrange for skilled nursing facility placement-he will need to go to a facility that can transport him for treatment 3. Schedule the left colectomy and ureter stent placement 4. Inpatient versus outpatient chemotherapy pending timing of skilled nursing facility placement 5. Stop Decadron   LOS: 8 days   [name]  09/13/2015, 1:14 PM

## 2015-09-13 NOTE — Progress Notes (Addendum)
[facility] PROGRESS NOTE  Carlos Black AOZ:[id number] DOB: 1951/09/26 DOA: 09/05/2015 PCP: [name], MD  Length of stay: 8   Assessment/Plan:  1. 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 -with L hydronephrosis, Urology Dr.Grapey following  2. 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 on 10/14 . He suspected the patient has chronic renal insufficiency and the left-sided hydronephrosis may not be contributing  much to the patient's renal failure -Continue gentle IV hydration  4. Testicular mass, tumor markers negative, likely lymphoma, requires left radical orchiectomy/cystoscopy with retrograde pyelogram and stent placement -Timing per Dr.Grapey  5. NSVT  -2-D echo within normal limits, EF 60-65% with cardiac enzymes negative 2  6. Anemia -due to lymphoma, CKD, dilution etc  7. Hyperkalemia Resolved  8. hyponatremia Likely secondary to hypovolemic hyponatremia , improving  9.  diabetes mellitus   hemoglobin A1c 6.2 . Currently has steroid-induced hyperglycemia. Continue lantus, SSI  10.  history of CVA -with residual L sided weakness -start ASA prior to DC  11. HTN -Hold antihypertensives for now. Follow.  DVT prophylaxsis heparin  Code Status:  DNR Communication: wife at bedside Dispo: IF SNF not possible for tomorrow then will stay and start Chemo on Friday    Code Status Orders        Start     Ordered   09/05/15 1958  Do not  attempt resuscitation (DNR)   Continuous    Question Answer Comment  In the event of cardiac or respiratory ARREST Do not call a "code blue"   In the event of cardiac or respiratory ARREST Do not perform Intubation, CPR, defibrillation or ACLS   In the event of cardiac or respiratory ARREST Use medication by any route, position, wound care, and other measures to relive pain and suffering. May use oxygen, suction and manual treatment of airway obstruction as needed for comfort.      09/05/15 1957         Brief narrative: 64 y o with history of CVA with residual left sided weakness, DM, HTN, CAD who presented to ED with sudden onset of left lower extremity numbness and left-sided weakness on the day of admission. Patient stated that he woke up around 10 or 11 AM went to try to use the commode when he noticed a numbness in his left leg. Patient stated that he was extremely weak and unable to get off the commode and had to have 3 people help him up. Patient also endorses a cough or caring mostly at night over the past 2-3 weeks which is productive in nature with some brownish sputum. Patient denies any fevers, no chills, no nausea, no chest pain, no emesis, no shortness of breath, no abdominal pain, no diarrhea, no constipation, no dysuria, no melena, no hematemesis, no hematochezia, no visual changes, no facial droop, no slurred speech. Patient was in the emergency room CT head which was done was negative. Patient was noted in the ED to have systolic blood  pressures in the 90s with a temp of 101.4 respiratory rate in the 30s and tachycardic with heart rate up to 109. Patient was given a liter bolus. Comprehensive metabolic profile obtained had a sodium of 129 potassium of 5.9 chloride of 93 BUN of 37 creatinine of 3.44 albumin of 3.0 AST of 50 bilirubin of 1.3 otherwise was within normal limits. Lactic acid level was 1.99. CBC had a hemoglobin of 11.2 otherwise was within normal limits. Chest x-ray which  was done was negative for any acute infiltrate. Urinalysis was pending. Blood cultures were drawn. Patient was given a dose of IV vancomycin and IV Zosyn. [facility] were called to admit the patient for further evaluation and management.  Consultants:  None   Procedures None  Antibiotics: Anti-infectives    Start     Dose/Rate Route Frequency Ordered Stop   09/11/15 1000  fluconazole (DIFLUCAN) tablet 100 mg     100 mg Oral Daily 09/10/15 1258     09/09/15 1130  cefTRIAXone (ROCEPHIN) 1 g in dextrose 5 % 50 mL IVPB  Status:  Discontinued     1 g 100 mL/hr over 30 Minutes Intravenous Every 24 hours 09/09/15 1123 09/11/15 1355   09/07/15 1800  vancomycin (VANCOCIN) IVPB 750 mg/150 ml premix  Status:  Discontinued     750 mg 150 mL/hr over 60 Minutes Intravenous Every 24 hours 09/07/15 1141 09/09/15 1123   09/07/15 1730  vancomycin (VANCOCIN) IVPB 1000 mg/200 mL premix  Status:  Discontinued     1,000 mg 200 mL/hr over 60 Minutes Intravenous Every 48 hours 09/05/15 1712 09/07/15 1141   09/06/15 2200  piperacillin-tazobactam (ZOSYN) IVPB 3.375 g  Status:  Discontinued     3.375 g 12.5 mL/hr over 240 Minutes Intravenous 3 times per day 09/06/15 1304 09/09/15 1123   09/06/15 1000  fluconazole (DIFLUCAN) IVPB 100 mg  Status:  Discontinued     100 mg 50 mL/hr over 60 Minutes Intravenous Every 24 hours 09/05/15 1947 09/10/15 1258   09/05/15 2200  fluconazole (DIFLUCAN) IVPB 200 mg     200 mg 100 mL/hr over 60 Minutes Intravenous  Once 09/05/15 1946 09/05/15 2316   09/05/15 1800  piperacillin-tazobactam (ZOSYN) IVPB 3.375 g  Status:  Discontinued     3.375 g 100 mL/hr over 30 Minutes Intravenous  Once 09/05/15 1758 09/05/15 1800   09/05/15 1800  vancomycin (VANCOCIN) IVPB 1000 mg/200 mL premix  Status:  Discontinued     1,000 mg 200 mL/hr over 60 Minutes Intravenous  Once 09/05/15 1758 09/05/15 1759   09/05/15 1730  piperacillin-tazobactam (ZOSYN) IVPB 2.25 g  Status:   Discontinued     2.25 g 100 mL/hr over 30 Minutes Intravenous 4 times per day 09/05/15 1712 09/06/15 1304   09/05/15 1730  vancomycin (VANCOCIN) IVPB 1000 mg/200 mL premix  Status:  Discontinued     1,000 mg 200 mL/hr over 60 Minutes Intravenous  Once 09/05/15 1712 09/05/15 1715   09/05/15 1730  vancomycin (VANCOCIN) 1,500 mg in sodium chloride 0.9 % 500 mL IVPB     1,500 mg 250 mL/hr over 120 Minutes Intravenous  Once 09/05/15 1715 09/05/15 2026         HPI/Subjective: Feels ok, no events overnight  Objective: Filed Vitals:   09/12/15 0442 09/12/15 1343 09/12/15 1736 09/12/15 2106  BP: 120/70 129/79 133/75 130/78  Pulse: 72 79 70 67  Temp: 97.9 F (36.6 C) 97.8 F (36.6 C) 98 F (36.7 C) 97.6 F (36.4 C)  TempSrc: Oral Oral Oral Oral  Resp: $Remo'18 17 18 16  'rzOfm$ Height:      Weight: 78.472 kg (173 lb)     SpO2: 93% 98% 97% 97%    Intake/Output Summary (Last 24 hours) at 09/13/15 1134 Last data filed at 09/13/15 2263  Gross per 24 hour  Intake 3206.25 ml  Output   1975 ml  Net 1231.25 ml    Exam:  General: No acute respiratory distress Lungs: Clear to auscultation bilaterally without wheezes or crackles Cardiovascular: Regular rate and rhythm without murmur gallop or rub normal S1 and S2 Abdomen: Nontender, nondistended, soft, bowel sounds positive, no rebound, no ascites, no appreciable mass Extremities: No significant cyanosis, clubbing, or edema bilateral lower extremities NEuro: chronic L hemiplegia   Data Review   Micro Results Recent Results (from the past 240 hour(s))  Blood culture (routine x 2)     Status: None   Collection Time: 09/05/15  3:00 PM  Result Value Ref Range Status   Specimen Description BLOOD RIGHT ARM  Final   Special Requests BOTTLES DRAWN AEROBIC AND ANAEROBIC 5CC  Final   Culture NO GROWTH 5 DAYS  Final   Report Status 09/10/2015 FINAL  Final  Blood culture (routine x 2)     Status: None   Collection Time: 09/05/15  4:43 PM  Result  Value Ref Range Status   Specimen Description BLOOD LEFT HAND  Final   Special Requests BOTTLES DRAWN AEROBIC AND ANAEROBIC 4CC  Final   Culture NO GROWTH 5 DAYS  Final   Report Status 09/10/2015 FINAL  Final  MRSA PCR Screening     Status: None   Collection Time: 09/05/15  8:28 PM  Result Value Ref Range Status   MRSA by PCR NEGATIVE NEGATIVE Final    Comment:        The GeneXpert MRSA Assay (FDA approved for NASAL specimens only), is one component of a comprehensive MRSA colonization surveillance program. It is not intended to diagnose MRSA infection nor to guide or monitor treatment for MRSA infections.   Urine culture     Status: None   Collection Time: 09/05/15 10:20 PM  Result Value Ref Range Status   Specimen Description URINE, RANDOM  Final   Special Requests NONE  Final   Culture 3,000 COLONIES/mL INSIGNIFICANT GROWTH  Final   Report Status 09/07/2015 FINAL  Final  Culture, blood (routine x 2)     Status: None   Collection Time: 09/07/15  9:17 AM  Result Value Ref Range Status   Specimen Description BLOOD RIGHT ANTECUBITAL  Final   Special Requests BOTTLES DRAWN AEROBIC AND ANAEROBIC 5CC 10CC  Final   Culture NO GROWTH 5 DAYS  Final   Report Status 09/12/2015 FINAL  Final  Culture, blood (routine x 2)     Status: None   Collection Time: 09/07/15  9:27 AM  Result Value Ref Range Status   Specimen Description BLOOD RIGHT HAND  Final   Special Requests BOTTLES DRAWN AEROBIC AND ANAEROBIC 10CC  Final   Culture NO GROWTH 5 DAYS  Final   Report Status 09/12/2015 FINAL  Final  C difficile quick scan w PCR reflex     Status: None   Collection Time: 09/13/15  4:00 AM  Result Value Ref Range Status   C Diff antigen NEGATIVE NEGATIVE Final   C Diff toxin NEGATIVE NEGATIVE Final   C Diff interpretation Negative for toxigenic C. difficile  Final    Radiology Reports Ct Abdomen  Pelvis Wo Contrast  09/07/2015  CLINICAL DATA:  Evaluate retroperitoneal mass seen on recent  MRI of the lumbar spine. EXAM: CT CHEST, ABDOMEN AND PELVIS WITHOUT CONTRAST TECHNIQUE: Multidetector CT imaging of the chest, abdomen and pelvis was performed following the standard protocol without IV contrast. COMPARISON:  Lumbar spine MRI 09/06/2015 and renal ultrasound 09/06/2015 FINDINGS: CT CHEST FINDINGS Mediastinum/Lymph Nodes: No chest wall mass, supraclavicular or axillary lymphadenopathy. Small scattered lymph nodes are noted. The thyroid gland is grossly normal. The heart is normal in size. No pericardial effusion. The aorta is normal in caliber. Mild tortuosity and scattered atherosclerotic calcifications. There are extensive 3 vessel coronary artery calcifications. Moderate distal esophageal wall thickening. Could not exclude neoplasm or esophagitis. No paraesophageal lymphadenopathy. Lungs/Pleura: Limited examination due to breathing motion artifact. There is a small left pleural effusion and areas of atelectasis. Rounded pleural density noted posteriorly at the right lung apex measures -45 Hounsfield units and is probably a small benign pleural lipoma. No worrisome pulmonary lesions to suggest a primary lung neoplasm or metastatic disease. Musculoskeletal: No significant bony findings. CT ABDOMEN PELVIS FINDINGS Hepatobiliary: No obvious hepatic lesions without contrast. Mild hepatomegaly. The gallbladder is distended and there are small gallstones noted. Pancreas: No pancreatic mass, inflammation or ductal dilatation. Spleen: Upper limits of normal.  No focal lesions. Adrenals/Urinary Tract: Bilateral adrenal gland masses. The right measures a maximum of 37.5 mm and the left 31 mm. No obvious renal mass. There is left-sided hydronephrosis due to a large left-sided retroperitoneal mass which is surrounding the left ureter and lower pole region of the left kidney. I do not see a fat plane between this mass and the aorta. It extends down into the upper left pelvis. This could reflect aggressive  lymphoma or retroperitoneal sarcoma. The mass measures approximately 16.4 x 9.7 x 8.7 cm. Recommend image guided biopsy. PET-CT may also be helpful for further evaluation and accurate staging. Stomach/Bowel: The stomach, duodenum, small bowel and colon are grossly normal. No inflammatory changes, mass lesions or obstructive findings. The terminal ileum is normal. Vascular/Lymphatic: Large left-sided retroperitoneal mass as discussed above. Moderate atherosclerotic calcifications involving the aorta but no aneurysm. Reproductive: The bladder, prostate gland and seminal vesicles are unremarkable. Other: 3.6 x 2.1 cm internal iliac nodal mass on the left side. Small amount of free the pelvic fluid and presacral edema. Bilateral inguinal hernias containing fat and fluid and small bowel on the right side. Suspect undescended testicle in the left inguinal canal. This would raise the possibility of testicular cancer. No inguinal adenopathy. Musculoskeletal: No significant bony findings. IMPRESSION: 1. Bulky left-sided retroperitoneal mass which is obstructing the left ureter and causing left-sided hydronephrosis. There are also bilateral adrenal gland masses. Findings could be due to aggressive lymphoma, retroperitoneal sarcoma or possibly testicular carcinoma given the fact that it appears the patients left testicle is in the left inguinal canal. No obvious testicular mass. Scrotal ultrasound may be helpful for further evaluation. Image guided Biopsy is recommended of the left retroperitoneal mass. 2. No significant findings in the chest. No evidence of a primary lung neoplasm or metastatic pulmonary disease. No mediastinal or hilar mass or adenopathy. 3. Extensive 3 vessel coronary artery calcifications. 4. Small left pleural effusion. 5. Distended gallbladder and cholelithiasis. 6. Bilateral inguinal hernias containing fat, fluid and small bowel on the right side. Electronically Signed   By: Marijo Sanes M.D.   On:  09/07/2015 13:35   Dg Chest 2 View  09/05/2015  CLINICAL DATA:  64 year old with  acute onset of left lower extremity paresis/numbness which began earlier today. Fever. Current history of hypertension and diabetes. Prior stroke and MI. EXAM: CHEST  2 VIEW COMPARISON:  07/26/2004, 07/20/2004. FINDINGS: AP semi-erect and lateral images were obtained. Chronic elevation of the right hemidiaphragm with chronic scar/atelectasis involving the right lower lobe and right middle lobe, unchanged. Lungs otherwise clear. No localized airspace consolidation. No pleural effusions. No pneumothorax. Normal pulmonary vascularity. Cardiac silhouette upper normal in size for AP technique. Thoracic aorta mildly atherosclerotic and tortuous, unchanged. Hilar and mediastinal contours otherwise unremarkable. Visualized bony thorax intact with only mild degenerative changes in the mid thoracic region. IMPRESSION: 1.  No acute cardiopulmonary disease. 2. Stable chronic elevation of the right hemidiaphragm and chronic scar/atelectasis involving the right lower lobe and right middle lobe. Electronically Signed   By: Evangeline Dakin M.D.   On: 09/05/2015 15:26   Ct Head Wo Contrast  09/05/2015  CLINICAL DATA:  Left-sided weakness from previous CVA, weakness has increased over the last day with difficulty walking EXAM: CT HEAD WITHOUT CONTRAST TECHNIQUE: Contiguous axial images were obtained from the base of the skull through the vertex without intravenous contrast. COMPARISON:  12/21/2011 FINDINGS: The bony calvarium is intact. Atrophic changes are noted. No findings to suggest acute hemorrhage, acute infarction or space-occupying mass lesion are noted. IMPRESSION: Atrophic changes without acute abnormality. Electronically Signed   By: Inez Catalina M.D.   On: 09/05/2015 16:37   Ct Chest Wo Contrast  09/07/2015  CLINICAL DATA:  Evaluate retroperitoneal mass seen on recent MRI of the lumbar spine. EXAM: CT CHEST, ABDOMEN AND PELVIS  WITHOUT CONTRAST TECHNIQUE: Multidetector CT imaging of the chest, abdomen and pelvis was performed following the standard protocol without IV contrast. COMPARISON:  Lumbar spine MRI 09/06/2015 and renal ultrasound 09/06/2015 FINDINGS: CT CHEST FINDINGS Mediastinum/Lymph Nodes: No chest wall mass, supraclavicular or axillary lymphadenopathy. Small scattered lymph nodes are noted. The thyroid gland is grossly normal. The heart is normal in size. No pericardial effusion. The aorta is normal in caliber. Mild tortuosity and scattered atherosclerotic calcifications. There are extensive 3 vessel coronary artery calcifications. Moderate distal esophageal wall thickening. Could not exclude neoplasm or esophagitis. No paraesophageal lymphadenopathy. Lungs/Pleura: Limited examination due to breathing motion artifact. There is a small left pleural effusion and areas of atelectasis. Rounded pleural density noted posteriorly at the right lung apex measures -45 Hounsfield units and is probably a small benign pleural lipoma. No worrisome pulmonary lesions to suggest a primary lung neoplasm or metastatic disease. Musculoskeletal: No significant bony findings. CT ABDOMEN PELVIS FINDINGS Hepatobiliary: No obvious hepatic lesions without contrast. Mild hepatomegaly. The gallbladder is distended and there are small gallstones noted. Pancreas: No pancreatic mass, inflammation or ductal dilatation. Spleen: Upper limits of normal.  No focal lesions. Adrenals/Urinary Tract: Bilateral adrenal gland masses. The right measures a maximum of 37.5 mm and the left 31 mm. No obvious renal mass. There is left-sided hydronephrosis due to a large left-sided retroperitoneal mass which is surrounding the left ureter and lower pole region of the left kidney. I do not see a fat plane between this mass and the aorta. It extends down into the upper left pelvis. This could reflect aggressive lymphoma or retroperitoneal sarcoma. The mass measures  approximately 16.4 x 9.7 x 8.7 cm. Recommend image guided biopsy. PET-CT may also be helpful for further evaluation and accurate staging. Stomach/Bowel: The stomach, duodenum, small bowel and colon are grossly normal. No inflammatory changes, mass lesions or obstructive findings. The terminal  ileum is normal. Vascular/Lymphatic: Large left-sided retroperitoneal mass as discussed above. Moderate atherosclerotic calcifications involving the aorta but no aneurysm. Reproductive: The bladder, prostate gland and seminal vesicles are unremarkable. Other: 3.6 x 2.1 cm internal iliac nodal mass on the left side. Small amount of free the pelvic fluid and presacral edema. Bilateral inguinal hernias containing fat and fluid and small bowel on the right side. Suspect undescended testicle in the left inguinal canal. This would raise the possibility of testicular cancer. No inguinal adenopathy. Musculoskeletal: No significant bony findings. IMPRESSION: 1. Bulky left-sided retroperitoneal mass which is obstructing the left ureter and causing left-sided hydronephrosis. There are also bilateral adrenal gland masses. Findings could be due to aggressive lymphoma, retroperitoneal sarcoma or possibly testicular carcinoma given the fact that it appears the patients left testicle is in the left inguinal canal. No obvious testicular mass. Scrotal ultrasound may be helpful for further evaluation. Image guided Biopsy is recommended of the left retroperitoneal mass. 2. No significant findings in the chest. No evidence of a primary lung neoplasm or metastatic pulmonary disease. No mediastinal or hilar mass or adenopathy. 3. Extensive 3 vessel coronary artery calcifications. 4. Small left pleural effusion. 5. Distended gallbladder and cholelithiasis. 6. Bilateral inguinal hernias containing fat, fluid and small bowel on the right side. Electronically Signed   By: Marijo Sanes M.D.   On: 09/07/2015 13:35   Mr Brain Wo Contrast  09/06/2015   CLINICAL DATA:  LEFT lower extremity numbness and LEFT-sided weakness for 2 days. Night cough for 2-3 weeks. History of stroke and LEFT-sided weakness, diabetes, hypertension, coronary artery disease. EXAM: MRI HEAD WITHOUT CONTRAST TECHNIQUE: Multiplanar, multiecho pulse sequences of the brain and surrounding structures were obtained without intravenous contrast. COMPARISON:  CT head September 05, 2015 at 1626 hours FINDINGS: Multiple sequences are mild or moderately motion degraded. No reduced diffusion to suggest acute ischemia. No susceptibility artifact to suggest hemorrhage. Ventricles and sulci are normal for patient's age. Old RIGHT ventral pons infarct. Old RIGHT basal ganglia lacunar infarct. Mild RIGHT cerebral peduncle volume loss compatible with wallerian degeneration. Mild white matter changes compatible with chronic small vessel ischemic disease, decreased sensitivity due to patient motion. No abnormal extra-axial fluid collections. Dolicoectatic intracranial vessel flow voids seen at the skull base. Ocular globes and orbital contents are grossly normal though motion degraded. Paranasal sinuses and mastoid air cells are well aerated. No abnormal sellar expansion. No is abnormal cerebellar tonsillar descent below the foramen magnum. No suspicious bone marrow signal. IMPRESSION: No acute intracranial process on this motion degraded examination. Involutional changes and mild chronic small vessel ischemic disease. Old RIGHT pontine infarct. Old RIGHT basal ganglia lacunar infarct. Electronically Signed   By: Elon Alas M.D.   On: 09/06/2015 02:55   Mr Lumbar Spine Wo Contrast  09/06/2015  CLINICAL DATA:  LEFT lower extremity numbness and LEFT-sided weakness for 2 days. History of stroke in LEFT-sided weakness, diabetes, hypertension, coronary artery disease. EXAM: MRI LUMBAR SPINE WITHOUT CONTRAST TECHNIQUE: Multiplanar, multisequence MR imaging of the lumbar spine was performed. No intravenous  contrast was administered. COMPARISON:  None. FINDINGS: Lumbar vertebral bodies and posterior elements are intact and aligned with maintenance of lumbar lordosis. Intervertebral discs demonstrate normal morphology, with slight decreased T2 signal within the lower lumbar disc consistent with mild desiccation. Minimal subacute discogenic endplate changes at all lumbar levels, no STIR signal abnormality to suggest acute osseous process. Conus medullaris terminates at L1 and appears normal in morphology and signal characteristics. Limited assessment of cauda  equina due to patient motion. LEFT abdominal mass partially imaged, with mass effect on the LEFT psoas muscle. Probable lymphadenopathy along LEFT Common iliac chain. Probable obstructive uropathy on the LEFT. Partially imaged sub cm gallstone. Level by level evaluation: L1-2 and L2-3: No significant disc bulge, canal stenosis or neural foraminal narrowing. L3-4: Small broad-based disc bulge, mild facet and ligamentum flavum redundancy without canal stenosis. Minimal bilateral neural foraminal narrowing. L4-5: Small broad-based disc bulge asymmetric to LEFT. Mild facet arthropathy and ligamentum flavum redundancy without canal stenosis. Minimal LEFT neural foraminal narrowing. L5-S1: No disc bulge, canal stenosis nor neural foraminal narrowing. IMPRESSION: No acute lumbar spine fracture nor malalignment. No canal stenosis. Minimal L3-4 and L4-5 neural foraminal narrowing. Large LEFT retroperitoneal mass with mass effect on the LEFT psoas muscle and probable lymphadenopathy along LEFT iliac chain. Recommend CT of the abdomen and pelvis with contrast. Mass appears to result in LEFT obstructive uropathy. Electronically Signed   By: Elon Alas M.D.   On: 09/06/2015 23:18   US Scrotum  09/07/2015  CLINICAL DATA:  64 year old male with palpable testicular mass. EXAM: ULTRASOUND OF SCROTUM TECHNIQUE: Complete ultrasound examination of the testicles, epididymis,  and other scrotal structures was performed. COMPARISON:  None. FINDINGS: Right testicle Measurements: 4.5 x 2.6 x 2 cm. No mass or microlithiasis visualized. Left testicle Measurements: 4.4 x 3.4 x 2.9 cm. A 2.2 x 3.3 x 2.3 cm oval hypoechoic area within the left testicle is noted with some vascular flow. No other abnormalities identified. Right epididymis:  Not visualized Left epididymis:  Normal in size and appearance. Hydrocele:  Small - moderate bilateral hydroceles noted. Varicocele:  Mild left varicocele noted. IMPRESSION: 2.2 x 3.3 x 2.3 cm oval hypoechoic area within the left testicle suspicious for testicular mass/neoplasm. Urology consultation is recommended. Unremarkable right testicle. Small to moderate bilateral hydroceles. Electronically Signed   By: Margarette Canada M.D.   On: 09/07/2015 20:02   US Renal  09/06/2015  CLINICAL DATA:  Acute renal failure. EXAM: RENAL / URINARY TRACT ULTRASOUND COMPLETE COMPARISON:  None. FINDINGS: Right Kidney: Length: 9.6 cm. Heterogeneous increased parenchymal echotexture with parenchymal thinning suggesting chronic medical renal disease. No hydronephrosis or solid mass. Left Kidney: Length: 9.8 cm. Heterogeneous increased parenchymal echotexture likely indicating chronic medical renal disease. Appears to be circumscribed hypoechoic appearance of the lower pole of the left kidney. This may indicate edema versus abscess. No hydronephrosis or solid mass identified. Bladder: No bladder wall thickening or filling defect. Bilateral urine flow jets are demonstrated on color flow Doppler imaging. Incidental note of mildly enlarged prostate gland measuring 3.6 x 3.6 x 3.3 cm. IMPRESSION: Heterogeneous parenchymal echotexture bilaterally suggesting chronic medical renal disease. Hypoechoic appearance of the lower pole left kidney could indicate edema, pyelonephritis, or abscess. No hydronephrosis in either kidney. Mild prostate enlargement. Electronically Signed   By: Lucienne Capers M.D.   On: 09/06/2015 03:19   Ct Biopsy  09/08/2015  CLINICAL DATA:  Left retroperitoneal mass.  Left testicular mass. EXAM: CT-GUIDED BIOPSY LEFT RETROPERITONEAL MASS.  CORE. MEDICATIONS AND MEDICAL HISTORY: Versed 1 mg, Fentanyl 25 mcg. Additional Medications: None. ANESTHESIA/SEDATION: Moderate sedation time: 6 minutes PROCEDURE: The procedure, risks, benefits, and alternatives were explained to the patient. Questions regarding the procedure were encouraged and answered. The patient understands and consents to the procedure. The back was prepped with ChloraPrep in a sterile fashion, and a sterile drape was applied covering the operative field. A sterile gown and sterile gloves were used for the procedure.  Under CT guidance, a(n) 17 gauge guide needle was advanced into the left retroperitoneal mass. Subsequently 3 18 gauge core biopsies were obtained. The guide needle was removed. Final imaging was performed. Patient tolerated the procedure well without complication. Vital sign monitoring by nursing staff during the procedure will continue as patient is in the special procedures unit for post procedure observation. FINDINGS: The images document guide needle placement within the left retroperitoneal mass. Post biopsy images demonstrate no hemorrhage. COMPLICATIONS: None IMPRESSION: Successful CT-guided core biopsy of a left retroperitoneal mass. Electronically Signed   By: Marybelle Killings M.D.   On: 09/08/2015 16:50   Portable Chest 1 View  09/06/2015  CLINICAL DATA:  Fever. EXAM: PORTABLE CHEST 1 VIEW COMPARISON:  09/05/2015.  07/20/2004. FINDINGS: Mediastinum and hilar structures are normal. Heart size stable. Persistent right lower lobe subsegmental atelectasis and/or scarring. No pleural effusion pneumothorax. IMPRESSION: Stable right base subsegmental atelectasis and/or scarring. No acute cardiopulmonary disease. Electronically Signed   By: Marcello Moores  Register   On: 09/06/2015 07:19      CBC  Recent Labs Lab 09/07/15 0917 09/08/15 0404 09/09/15 0353 09/13/15 0600  WBC 4.3 3.8* 5.8 9.2  HGB 9.3* 9.2* 10.0* 8.7*  HCT 28.6* 28.1* 30.6* 27.2*  PLT 180 189 229 265  MCV 77.3* 77.0* 76.3* 78.6  MCH 25.1* 25.2* 24.9* 25.1*  MCHC 32.5 32.7 32.7 32.0  RDW 16.5* 16.6* 16.7* 17.4*  LYMPHSABS 0.9  --   --   --   MONOABS 0.8  --   --   --   EOSABS 0.2  --   --   --   BASOSABS 0.0  --   --   --     Chemistries   Recent Labs Lab 09/07/15 0917 09/08/15 0404 09/09/15 0353 09/09/15 2148 09/10/15 0427 09/11/15 0258 09/12/15 0242 09/13/15 0600  NA 135 133* 138  --  139 137 138 140  K 5.2* 4.3 4.3  --  4.4 4.6 4.4 4.2  CL 105 98* 102  --  104 104 107 110  CO2 21* 22 24  --  20* 23 18* 19*  GLUCOSE 103* 225* 248*  --  264* 243* 281* 205*  BUN 24* 30* 38*  --  45* 45* 48* 49*  CREATININE 3.01* 3.19* 3.14*  --  2.93* 2.52* 2.35* 2.15*  CALCIUM 8.2* 7.8* 8.3*  --  8.1* 7.5* 7.5* 7.6*  MG  --   --   --  2.0  --   --   --   --   AST 41 35 25  --   --   --   --   --   ALT $Re'20 22 18  'nZr$ --   --   --   --   --   ALKPHOS 64 63 54  --   --   --   --   --   BILITOT 1.0 0.8 0.7  --   --   --   --   --    ------------------------------------------------------------------------------------------------------------------ estimated creatinine clearance is 32.6 mL/min (by C-G formula based on Cr of 2.15). ------------------------------------------------------------------------------------------------------------------ No results for input(s): HGBA1C in the last 72 hours. ------------------------------------------------------------------------------------------------------------------ No results for input(s): CHOL, HDL, LDLCALC, TRIG, CHOLHDL, LDLDIRECT in the last 72 hours. ------------------------------------------------------------------------------------------------------------------ No results for input(s): TSH, T4TOTAL, T3FREE, THYROIDAB in the last 72 hours.  Invalid input(s):  FREET3 ------------------------------------------------------------------------------------------------------------------ No results for input(s): VITAMINB12, FOLATE, FERRITIN, TIBC, IRON, RETICCTPCT in the last 72 hours.  Coagulation profile No results for  input(s): INR, PROTIME in the last 168 hours.  No results for input(s): DDIMER in the last 72 hours.  Cardiac Enzymes No results for input(s): CKMB, TROPONINI, MYOGLOBIN in the last 168 hours.  Invalid input(s): CK ------------------------------------------------------------------------------------------------------------------ Invalid input(s): POCBNP   CBG:  Recent Labs Lab 09/11/15 2043 09/12/15 0633 09/12/15 1109 09/12/15 1624 09/13/15 0832  GLUCAP 267* 243* 285* 258* 168*       Studies: No results found.    Lab Results  Component Value Date   HGBA1C 6.2* 09/05/2015   HGBA1C 13.2* 12/22/2011   Lab Results  Component Value Date   CREATININE 2.15* 09/13/2015       Scheduled Meds: . aspirin  81 mg Oral Daily  . dexamethasone  4 mg Oral Q12H  . feeding supplement (GLUCERNA SHAKE)  237 mL Oral TID BM  . fluconazole  100 mg Oral Daily  . guaiFENesin  1,200 mg Oral BID  . heparin  5,000 Units Subcutaneous 3 times per day  . insulin aspart  0-9 Units Subcutaneous TID WC  . insulin glargine  15 Units Subcutaneous Daily  . pantoprazole  40 mg Oral QHS  . sodium chloride  3 mL Intravenous Q12H   Continuous Infusions: . sodium chloride 75 mL/hr at 09/13/15 1002    Principal Problem:   SIRS (systemic inflammatory response syndrome) (HCC) Active Problems:   Diabetes mellitus (HCC)   Weakness   Hypotension   Hyperkalemia   ARF (acute renal failure) (HCC)   Dehydration   Left sided numbness   H/O: CVA (cerebrovascular accident)   CAD (coronary artery disease)   Oral thrush   Dysphagia   Anemia   Hyponatremia   Weakness of left side of body   Pelvic mass in male   Back pain    Time spent:  35 minutes   [facility] Pager [phone]. If 7PM-7AM, please contact night-coverage at [email or web address], password [facility] 09/13/2015, 11:34 AM  LOS: 8 days

## 2015-09-13 NOTE — Progress Notes (Signed)
Occupational Therapy Treatment Patient Details Name: Carlos Black MRN: [id number] DOB: 06-09-1951 Today's Date: 09/13/2015    History of present illness 64 y o with history of CVA with residual left sided weakness, DM, HTN, CAD who presented to ED with sudden onset of left lower extremity numbness and left-sided weakness on the day of admission. Patient stated that he was extremely weak and unable to get off the commode and had to have 3 people help him up.   OT comments  Carlos Black stated [facility].  Pt would do well on CIR or SNF  Follow Up Recommendations       Equipment Recommendations  None recommended by OT    Recommendations for Other Services      Precautions / Restrictions Precautions Precautions: Fall Precaution Comments: left hemiparesis from previous CVA Restrictions Weight Bearing Restrictions: No       Mobility Bed Mobility          pt in chair        Transfers    pt sitting in chair. Declined standing                      ADL Overall ADL's : Needs assistance/impaired Eating/Feeding: Set up;Sitting Eating/Feeding Details (indicate cue type and reason): pt does use L hand to A with holding and for stabilizing Grooming: Minimal assistance;Sitting                                 General ADL Comments: Pt in chair sitting very crooked.  Much of  OT session spent on repositioining.  legs down and needed to be elevated due to edema.  L arm propped on pillows due to arm down beside trunk in chair.  Educated Black and pt on proper positioning in chair.                Cognition   Behavior During Therapy: Flat affect;WFL for tasks assessed/performed Overall Cognitive Status: History of cognitive impairments - at baseline                       Extremity/Trunk Assessment       LUE with limited ROM. OT did perform gentle stretching and positiong with LUE. Pt stated he didn't  perform ROM with this arm often.                    Pertinent Vitals/ Pain       Pain Assessment: No/denies pain            Progress Toward Goals  OT Goals(current goals can now be found in the care plan section)  Progress towards OT goals: Progressing toward goals  Acute Rehab OT Goals Patient Stated Goal: Get better and become more independent.  Plan Discharge plan remains appropriate       End of Session     Activity Tolerance Patient tolerated treatment well   Patient Left in chair;with call bell/phone within reach;with family/visitor present;with chair alarm set   Nurse Communication Mobility status        Time: 8242-3536 OT Time Calculation (min): 17 min  Charges: OT General Charges $OT Visit: 1 Procedure OT Treatments $Self Care/Home Management : 8-22 mins  Darius Fillingim D 09/13/2015, 11:23 AM

## 2015-09-13 NOTE — Progress Notes (Signed)
CSW gave bed offers. Pt accepted bed at [facility].   Belia Heman, [place] Work  [employer] [phone]

## 2015-09-13 NOTE — Progress Notes (Addendum)
Inpatient Diabetes Program Recommendations  AACE/ADA: New Consensus Statement on Inpatient Glycemic Control (2015)  Target Ranges:  Prepandial:   less than 140 mg/dL      Peak postprandial:   less than 180 mg/dL (1-2 hours)      Critically ill patients:  140 - 180 mg/dL   Review of Glycemic Control  Inpatient Diabetes Program Recommendations: Noted addition of lantus 15 units  While on Decadron would benefit from increase in correction to moderate q 4 hrs.  Thank you Rosita Kea, RN, MSN, CDE  Diabetes Inpatient Program Office: [phone] Pager: [phone] 8:00 am to 5:00 pm

## 2015-09-14 ENCOUNTER — Other Ambulatory Visit: Payer: Self-pay | Admitting: Nurse Practitioner

## 2015-09-14 ENCOUNTER — Other Ambulatory Visit (HOSPITAL_COMMUNITY): Payer: Medicare Other

## 2015-09-14 ENCOUNTER — Telehealth: Payer: Self-pay | Admitting: *Deleted

## 2015-09-14 ENCOUNTER — Telehealth: Payer: Self-pay | Admitting: Nurse Practitioner

## 2015-09-14 DIAGNOSIS — N178 Other acute kidney failure: Secondary | ICD-10-CM

## 2015-09-14 DIAGNOSIS — R21 Rash and other nonspecific skin eruption: Secondary | ICD-10-CM

## 2015-09-14 DIAGNOSIS — E871 Hypo-osmolality and hyponatremia: Secondary | ICD-10-CM

## 2015-09-14 DIAGNOSIS — C8330 Diffuse large B-cell lymphoma, unspecified site: Secondary | ICD-10-CM

## 2015-09-14 LAB — BASIC METABOLIC PANEL
ANION GAP: 8 (ref 5–15)
BUN: 45 mg/dL — ABNORMAL HIGH (ref 6–20)
CALCIUM: 8.5 mg/dL — AB (ref 8.9–10.3)
CHLORIDE: 112 mmol/L — AB (ref 101–111)
CO2: 19 mmol/L — AB (ref 22–32)
Creatinine, Ser: 1.99 mg/dL — ABNORMAL HIGH (ref 0.61–1.24)
GFR calc non Af Amer: 33 mL/min — ABNORMAL LOW (ref >60)
GFR, EST AFRICAN AMERICAN: 39 mL/min — AB (ref >60)
Glucose, Bld: 112 mg/dL — ABNORMAL HIGH (ref 65–99)
Potassium: 3.9 mmol/L (ref 3.5–5.1)
SODIUM: 139 mmol/L (ref 135–145)

## 2015-09-14 LAB — GLUCOSE, CAPILLARY
Glucose-Capillary: 89 mg/dL (ref 65–99)
Glucose-Capillary: 221 mg/dL — ABNORMAL HIGH (ref 65–99)

## 2015-09-14 MED ORDER — ASPIRIN 81 MG PO CHEW: 81.0000 mg | CHEWABLE_TABLET | Freq: Every day | ORAL | Status: AC

## 2015-09-14 MED ORDER — ONDANSETRON HCL 4 MG PO TABS: 4.0000 mg | ORAL_TABLET | Freq: Four times a day (QID) | ORAL | Status: AC | PRN

## 2015-09-14 MED ORDER — DIPHENHYDRAMINE HCL 25 MG PO CAPS
ORAL_CAPSULE | ORAL | Status: AC
  Administered 2015-09-14: 25 mg
  Filled 2015-09-14: qty 1

## 2015-09-14 MED ORDER — PANTOPRAZOLE SODIUM 40 MG PO TBEC: 40.0000 mg | DELAYED_RELEASE_TABLET | Freq: Every day | ORAL | Status: AC

## 2015-09-14 MED ORDER — DIPHENHYDRAMINE HCL 25 MG PO CAPS: 25.0000 mg | ORAL_CAPSULE | Freq: Four times a day (QID) | ORAL | Status: DC | PRN

## 2015-09-14 MED ORDER — OXYCODONE HCL 5 MG PO TABS: 5.0000 mg | ORAL_TABLET | ORAL | Status: DC | PRN

## 2015-09-14 NOTE — Discharge Summary (Addendum)
Physician Discharge Summary  Carlos Black JXB:[id number] DOB: [birth year] DOA: 09/05/2015  PCP: [name], MD  Admit date: 09/05/2015 Discharge date: 09/14/2015  Time spent: 45 minutes  Recommendations for Outpatient Follow-up:  1. 10/24 : Monday at [facility] Day surgery for radical left orchiectomy with cystoscopy and retrograde pyelography and possible double J stent by Dr.Mark Karsten Ro, [facility] 2. 10/25 at 9:30am for Chemo Education, at [facility]  3. 10/27 at 8:45am for Chemotherapy and 9:15 for FU  4. PET SCAN to be scheduled  Discharge Diagnoses:    High Grade B cell lymphoma   Testicular mass   H/o CVA   Left Hemiplegia   Retroperitoneal lymphoma   Diabetes mellitus   AKI on CKD 3   Weakness   Hypotension   Hyperkalemia   ARF (acute renal failure)    Dehydration   Left sided numbness   H/O: CVA (cerebrovascular accident)   CAD (coronary artery disease)   Oral thrush   Dysphagia   Anemia   Hyponatremia   Weakness of left side of body   Pelvic mass in male   Back pain     Malignant lymphoma, large B-cell, diffuse    Discharge Condition: stable  Diet recommendation: Diabetic, heart healthy  Filed Weights   09/11/15 0600 09/12/15 0442 09/14/15 0508  Weight: 75.751 kg (167 lb) 78.472 kg (173 lb) 78.5 kg (173 lb 1 oz)    History of present illness:  Chief Complaint: left sided numbness and weakness  HPI: Carlos Black is a 64 y.o. male  Patient is a 63 y o with history of CVA with residual left sided weakness, DM, HTN, CAD who presented to ED with sudden onset of left lower extremity numbness and left-sided weakness on the day of admission  Hospital Course:  . High Grade B cell lymphoma/left retroperitoneal mass  - MRI of the lumbar spine on 10/12 showed a large left retroperitoneal mass with mass effect on the left psoas muscle,  -with L hydronephrosis, Urology Dr.Grapey following - s/p CT guided biopsy 10/14   -Biopsy positive for High Grade B cell lymphoma, followed by Dr.SHerril, he will be scheduled for Outpatient PET scan and will start Chemo next week  2. Tumor fever  -due to underlying lymphoma -all cultures negative, was on antibiotics on admission, then stopped -met SIRS criteria on admission  3 AKI on CKD 2-3 -last creatinine in system was 1.4 in 2013, recent baseline unknown -creatinine improving with hydration, on admission 3.4, now 1.9 at discharge -also with left-sided hydronephrosis, related to mass, Dr.Grapey (Urology) consulting -Dr.Abrol d/w [name] on 10/14 . He suspected the patient has chronic renal insufficiency and the left-sided hydronephrosis may not be contributing much to the patient's renal failure  4. Testicular mass, tumor markers negative, likely lymphoma, requires left radical orchiectomy/cystoscopy with retrograde pyelogram and possible stent placement -scheduled for Monday with Dr.Ottelin  5. NSVT  -2-D echo within normal limits, EF 60-65% with cardiac enzymes negative 2  6. Anemia -due to lymphoma, CKD, dilution etc  7. Hyperkalemia -due to AKI, Resolved  8. hyponatremia Likely secondary to hypovolemic hyponatremia , improving  9. diabetes mellitus  hemoglobin A1c 6.2 . GLipizide and metformin stopped -Glipizide may be resumed down the road if his CBGs trend up  10. history of CVA -with residual L sided weakness -MRI without new stroke or acute findings -start ASA prior to DC  11. HTN -continue Amlodipine, COzaar stopped due to AKI .  Consultations:  Urology Dr.Grapey  Onc [email or web address]  Discharge Exam: Filed Vitals:   09/14/15 0508  BP: 113/68  Pulse: 50  Temp: 98 F (36.7 C)  Resp: 24    General: AAOx3 Cardiovascular: S1S2/RRR Respiratory: CTAB  Discharge Instructions   Discharge Instructions    Diet - low sodium heart healthy    Complete by:  As directed      Diet Carb Modified    Complete by:  As directed       Increase activity slowly    Complete by:  As directed           Current Discharge Medication List    START taking these medications   Details  aspirin 81 MG chewable tablet Chew 1 tablet (81 mg total) by mouth daily.    ondansetron (ZOFRAN) 4 MG tablet Take 1 tablet (4 mg total) by mouth every 6 (six) hours as needed for nausea. Qty: 20 tablet, Refills: 0    oxyCODONE (OXY IR/ROXICODONE) 5 MG immediate release tablet Take 1 tablet (5 mg total) by mouth every 4 (four) hours as needed for moderate pain. Qty: 30 tablet, Refills: 0    pantoprazole (PROTONIX) 40 MG tablet Take 1 tablet (40 mg total) by mouth at bedtime.      CONTINUE these medications which have NOT CHANGED   Details  amLODipine (NORVASC) 10 MG tablet Take 1 tablet (10 mg total) by mouth daily. Qty: 30 tablet, Refills: 11    atorvastatin (LIPITOR) 10 MG tablet Take 1 tablet (10 mg total) by mouth daily. Qty: 30 tablet, Refills: 11      STOP taking these medications     amLODipine (NORVASC) 10 MG tablet      atorvastatin (LIPITOR) 10 MG tablet      losartan (COZAAR) 100 MG tablet      metFORMIN (GLUCOPHAGE) 1000 MG tablet      glimepiride (AMARYL) 4 MG tablet      metFORMIN (GLUCOPHAGE) 1000 MG tablet        No Known Allergies    The results of significant diagnostics from this hospitalization (including imaging, microbiology, ancillary and laboratory) are listed below for reference.    Significant Diagnostic Studies: Ct Abdomen Pelvis Wo Contrast  09/07/2015  CLINICAL DATA:  Evaluate retroperitoneal mass seen on recent MRI of the lumbar spine. EXAM: CT CHEST, ABDOMEN AND PELVIS WITHOUT CONTRAST TECHNIQUE: Multidetector CT imaging of the chest, abdomen and pelvis was performed following the standard protocol without IV contrast. COMPARISON:  Lumbar spine MRI 09/06/2015 and renal ultrasound 09/06/2015 FINDINGS: CT CHEST FINDINGS Mediastinum/Lymph Nodes: No chest wall mass, supraclavicular or axillary  lymphadenopathy. Small scattered lymph nodes are noted. The thyroid gland is grossly normal. The heart is normal in size. No pericardial effusion. The aorta is normal in caliber. Mild tortuosity and scattered atherosclerotic calcifications. There are extensive 3 vessel coronary artery calcifications. Moderate distal esophageal wall thickening. Could not exclude neoplasm or esophagitis. No paraesophageal lymphadenopathy. Lungs/Pleura: Limited examination due to breathing motion artifact. There is a small left pleural effusion and areas of atelectasis. Rounded pleural density noted posteriorly at the right lung apex measures -45 Hounsfield units and is probably a small benign pleural lipoma. No worrisome pulmonary lesions to suggest a primary lung neoplasm or metastatic disease. Musculoskeletal: No significant bony findings. CT ABDOMEN PELVIS FINDINGS Hepatobiliary: No obvious hepatic lesions without contrast. Mild hepatomegaly. The gallbladder is distended and there are small gallstones noted. Pancreas: No pancreatic mass, inflammation or ductal dilatation. Spleen: Upper limits of  normal.  No focal lesions. Adrenals/Urinary Tract: Bilateral adrenal gland masses. The right measures a maximum of 37.5 mm and the left 31 mm. No obvious renal mass. There is left-sided hydronephrosis due to a large left-sided retroperitoneal mass which is surrounding the left ureter and lower pole region of the left kidney. I do not see a fat plane between this mass and the aorta. It extends down into the upper left pelvis. This could reflect aggressive lymphoma or retroperitoneal sarcoma. The mass measures approximately 16.4 x 9.7 x 8.7 cm. Recommend image guided biopsy. PET-CT may also be helpful for further evaluation and accurate staging. Stomach/Bowel: The stomach, duodenum, small bowel and colon are grossly normal. No inflammatory changes, mass lesions or obstructive findings. The terminal ileum is normal. Vascular/Lymphatic: Large  left-sided retroperitoneal mass as discussed above. Moderate atherosclerotic calcifications involving the aorta but no aneurysm. Reproductive: The bladder, prostate gland and seminal vesicles are unremarkable. Other: 3.6 x 2.1 cm internal iliac nodal mass on the left side. Small amount of free the pelvic fluid and presacral edema. Bilateral inguinal hernias containing fat and fluid and small bowel on the right side. Suspect undescended testicle in the left inguinal canal. This would raise the possibility of testicular cancer. No inguinal adenopathy. Musculoskeletal: No significant bony findings. IMPRESSION: 1. Bulky left-sided retroperitoneal mass which is obstructing the left ureter and causing left-sided hydronephrosis. There are also bilateral adrenal gland masses. Findings could be due to aggressive lymphoma, retroperitoneal sarcoma or possibly testicular carcinoma given the fact that it appears the patients left testicle is in the left inguinal canal. No obvious testicular mass. Scrotal ultrasound may be helpful for further evaluation. Image guided Biopsy is recommended of the left retroperitoneal mass. 2. No significant findings in the chest. No evidence of a primary lung neoplasm or metastatic pulmonary disease. No mediastinal or hilar mass or adenopathy. 3. Extensive 3 vessel coronary artery calcifications. 4. Small left pleural effusion. 5. Distended gallbladder and cholelithiasis. 6. Bilateral inguinal hernias containing fat, fluid and small bowel on the right side. Electronically Signed   By: Marijo Sanes M.D.   On: 09/07/2015 13:35   Dg Chest 2 View  09/05/2015  CLINICAL DATA:  64 year old with acute onset of left lower extremity paresis/numbness which began earlier today. Fever. Current history of hypertension and diabetes. Prior stroke and MI. EXAM: CHEST  2 VIEW COMPARISON:  07/26/2004, 07/20/2004. FINDINGS: AP semi-erect and lateral images were obtained. Chronic elevation of the right  hemidiaphragm with chronic scar/atelectasis involving the right lower lobe and right middle lobe, unchanged. Lungs otherwise clear. No localized airspace consolidation. No pleural effusions. No pneumothorax. Normal pulmonary vascularity. Cardiac silhouette upper normal in size for AP technique. Thoracic aorta mildly atherosclerotic and tortuous, unchanged. Hilar and mediastinal contours otherwise unremarkable. Visualized bony thorax intact with only mild degenerative changes in the mid thoracic region. IMPRESSION: 1.  No acute cardiopulmonary disease. 2. Stable chronic elevation of the right hemidiaphragm and chronic scar/atelectasis involving the right lower lobe and right middle lobe. Electronically Signed   By: Evangeline Dakin M.D.   On: 09/05/2015 15:26   Ct Head Wo Contrast  09/05/2015  CLINICAL DATA:  Left-sided weakness from previous CVA, weakness has increased over the last day with difficulty walking EXAM: CT HEAD WITHOUT CONTRAST TECHNIQUE: Contiguous axial images were obtained from the base of the skull through the vertex without intravenous contrast. COMPARISON:  12/21/2011 FINDINGS: The bony calvarium is intact. Atrophic changes are noted. No findings to suggest acute hemorrhage, acute infarction  or space-occupying mass lesion are noted. IMPRESSION: Atrophic changes without acute abnormality. Electronically Signed   By: Inez Catalina M.D.   On: 09/05/2015 16:37   Ct Chest Wo Contrast  09/07/2015  CLINICAL DATA:  Evaluate retroperitoneal mass seen on recent MRI of the lumbar spine. EXAM: CT CHEST, ABDOMEN AND PELVIS WITHOUT CONTRAST TECHNIQUE: Multidetector CT imaging of the chest, abdomen and pelvis was performed following the standard protocol without IV contrast. COMPARISON:  Lumbar spine MRI 09/06/2015 and renal ultrasound 09/06/2015 FINDINGS: CT CHEST FINDINGS Mediastinum/Lymph Nodes: No chest wall mass, supraclavicular or axillary lymphadenopathy. Small scattered lymph nodes are noted. The  thyroid gland is grossly normal. The heart is normal in size. No pericardial effusion. The aorta is normal in caliber. Mild tortuosity and scattered atherosclerotic calcifications. There are extensive 3 vessel coronary artery calcifications. Moderate distal esophageal wall thickening. Could not exclude neoplasm or esophagitis. No paraesophageal lymphadenopathy. Lungs/Pleura: Limited examination due to breathing motion artifact. There is a small left pleural effusion and areas of atelectasis. Rounded pleural density noted posteriorly at the right lung apex measures -45 Hounsfield units and is probably a small benign pleural lipoma. No worrisome pulmonary lesions to suggest a primary lung neoplasm or metastatic disease. Musculoskeletal: No significant bony findings. CT ABDOMEN PELVIS FINDINGS Hepatobiliary: No obvious hepatic lesions without contrast. Mild hepatomegaly. The gallbladder is distended and there are small gallstones noted. Pancreas: No pancreatic mass, inflammation or ductal dilatation. Spleen: Upper limits of normal.  No focal lesions. Adrenals/Urinary Tract: Bilateral adrenal gland masses. The right measures a maximum of 37.5 mm and the left 31 mm. No obvious renal mass. There is left-sided hydronephrosis due to a large left-sided retroperitoneal mass which is surrounding the left ureter and lower pole region of the left kidney. I do not see a fat plane between this mass and the aorta. It extends down into the upper left pelvis. This could reflect aggressive lymphoma or retroperitoneal sarcoma. The mass measures approximately 16.4 x 9.7 x 8.7 cm. Recommend image guided biopsy. PET-CT may also be helpful for further evaluation and accurate staging. Stomach/Bowel: The stomach, duodenum, small bowel and colon are grossly normal. No inflammatory changes, mass lesions or obstructive findings. The terminal ileum is normal. Vascular/Lymphatic: Large left-sided retroperitoneal mass as discussed above. Moderate  atherosclerotic calcifications involving the aorta but no aneurysm. Reproductive: The bladder, prostate gland and seminal vesicles are unremarkable. Other: 3.6 x 2.1 cm internal iliac nodal mass on the left side. Small amount of free the pelvic fluid and presacral edema. Bilateral inguinal hernias containing fat and fluid and small bowel on the right side. Suspect undescended testicle in the left inguinal canal. This would raise the possibility of testicular cancer. No inguinal adenopathy. Musculoskeletal: No significant bony findings. IMPRESSION: 1. Bulky left-sided retroperitoneal mass which is obstructing the left ureter and causing left-sided hydronephrosis. There are also bilateral adrenal gland masses. Findings could be due to aggressive lymphoma, retroperitoneal sarcoma or possibly testicular carcinoma given the fact that it appears the patients left testicle is in the left inguinal canal. No obvious testicular mass. Scrotal ultrasound may be helpful for further evaluation. Image guided Biopsy is recommended of the left retroperitoneal mass. 2. No significant findings in the chest. No evidence of a primary lung neoplasm or metastatic pulmonary disease. No mediastinal or hilar mass or adenopathy. 3. Extensive 3 vessel coronary artery calcifications. 4. Small left pleural effusion. 5. Distended gallbladder and cholelithiasis. 6. Bilateral inguinal hernias containing fat, fluid and small bowel on the right side.  Electronically Signed   By: Marijo Sanes M.D.   On: 09/07/2015 13:35   Mr Brain Wo Contrast  09/06/2015  CLINICAL DATA:  LEFT lower extremity numbness and LEFT-sided weakness for 2 days. Night cough for 2-3 weeks. History of stroke and LEFT-sided weakness, diabetes, hypertension, coronary artery disease. EXAM: MRI HEAD WITHOUT CONTRAST TECHNIQUE: Multiplanar, multiecho pulse sequences of the brain and surrounding structures were obtained without intravenous contrast. COMPARISON:  CT head September 05, 2015 at 1626 hours FINDINGS: Multiple sequences are mild or moderately motion degraded. No reduced diffusion to suggest acute ischemia. No susceptibility artifact to suggest hemorrhage. Ventricles and sulci are normal for patient's age. Old RIGHT ventral pons infarct. Old RIGHT basal ganglia lacunar infarct. Mild RIGHT cerebral peduncle volume loss compatible with wallerian degeneration. Mild white matter changes compatible with chronic small vessel ischemic disease, decreased sensitivity due to patient motion. No abnormal extra-axial fluid collections. Dolicoectatic intracranial vessel flow voids seen at the skull base. Ocular globes and orbital contents are grossly normal though motion degraded. Paranasal sinuses and mastoid air cells are well aerated. No abnormal sellar expansion. No is abnormal cerebellar tonsillar descent below the foramen magnum. No suspicious bone marrow signal. IMPRESSION: No acute intracranial process on this motion degraded examination. Involutional changes and mild chronic small vessel ischemic disease. Old RIGHT pontine infarct. Old RIGHT basal ganglia lacunar infarct. Electronically Signed   By: Elon Alas M.D.   On: 09/06/2015 02:55   Mr Lumbar Spine Wo Contrast  09/06/2015  CLINICAL DATA:  LEFT lower extremity numbness and LEFT-sided weakness for 2 days. History of stroke in LEFT-sided weakness, diabetes, hypertension, coronary artery disease. EXAM: MRI LUMBAR SPINE WITHOUT CONTRAST TECHNIQUE: Multiplanar, multisequence MR imaging of the lumbar spine was performed. No intravenous contrast was administered. COMPARISON:  None. FINDINGS: Lumbar vertebral bodies and posterior elements are intact and aligned with maintenance of lumbar lordosis. Intervertebral discs demonstrate normal morphology, with slight decreased T2 signal within the lower lumbar disc consistent with mild desiccation. Minimal subacute discogenic endplate changes at all lumbar levels, no STIR signal  abnormality to suggest acute osseous process. Conus medullaris terminates at L1 and appears normal in morphology and signal characteristics. Limited assessment of cauda equina due to patient motion. LEFT abdominal mass partially imaged, with mass effect on the LEFT psoas muscle. Probable lymphadenopathy along LEFT Common iliac chain. Probable obstructive uropathy on the LEFT. Partially imaged sub cm gallstone. Level by level evaluation: L1-2 and L2-3: No significant disc bulge, canal stenosis or neural foraminal narrowing. L3-4: Small broad-based disc bulge, mild facet and ligamentum flavum redundancy without canal stenosis. Minimal bilateral neural foraminal narrowing. L4-5: Small broad-based disc bulge asymmetric to LEFT. Mild facet arthropathy and ligamentum flavum redundancy without canal stenosis. Minimal LEFT neural foraminal narrowing. L5-S1: No disc bulge, canal stenosis nor neural foraminal narrowing. IMPRESSION: No acute lumbar spine fracture nor malalignment. No canal stenosis. Minimal L3-4 and L4-5 neural foraminal narrowing. Large LEFT retroperitoneal mass with mass effect on the LEFT psoas muscle and probable lymphadenopathy along LEFT iliac chain. Recommend CT of the abdomen and pelvis with contrast. Mass appears to result in LEFT obstructive uropathy. Electronically Signed   By: Elon Alas M.D.   On: 09/06/2015 23:18   US Scrotum  09/07/2015  CLINICAL DATA:  64 year old male with palpable testicular mass. EXAM: ULTRASOUND OF SCROTUM TECHNIQUE: Complete ultrasound examination of the testicles, epididymis, and other scrotal structures was performed. COMPARISON:  None. FINDINGS: Right testicle Measurements: 4.5 x 2.6 x 2 cm. No  mass or microlithiasis visualized. Left testicle Measurements: 4.4 x 3.4 x 2.9 cm. A 2.2 x 3.3 x 2.3 cm oval hypoechoic area within the left testicle is noted with some vascular flow. No other abnormalities identified. Right epididymis:  Not visualized Left epididymis:   Normal in size and appearance. Hydrocele:  Small - moderate bilateral hydroceles noted. Varicocele:  Mild left varicocele noted. IMPRESSION: 2.2 x 3.3 x 2.3 cm oval hypoechoic area within the left testicle suspicious for testicular mass/neoplasm. Urology consultation is recommended. Unremarkable right testicle. Small to moderate bilateral hydroceles. Electronically Signed   By: Margarette Canada M.D.   On: 09/07/2015 20:02   US Renal  09/06/2015  CLINICAL DATA:  Acute renal failure. EXAM: RENAL / URINARY TRACT ULTRASOUND COMPLETE COMPARISON:  None. FINDINGS: Right Kidney: Length: 9.6 cm. Heterogeneous increased parenchymal echotexture with parenchymal thinning suggesting chronic medical renal disease. No hydronephrosis or solid mass. Left Kidney: Length: 9.8 cm. Heterogeneous increased parenchymal echotexture likely indicating chronic medical renal disease. Appears to be circumscribed hypoechoic appearance of the lower pole of the left kidney. This may indicate edema versus abscess. No hydronephrosis or solid mass identified. Bladder: No bladder wall thickening or filling defect. Bilateral urine flow jets are demonstrated on color flow Doppler imaging. Incidental note of mildly enlarged prostate gland measuring 3.6 x 3.6 x 3.3 cm. IMPRESSION: Heterogeneous parenchymal echotexture bilaterally suggesting chronic medical renal disease. Hypoechoic appearance of the lower pole left kidney could indicate edema, pyelonephritis, or abscess. No hydronephrosis in either kidney. Mild prostate enlargement. Electronically Signed   By: Lucienne Capers M.D.   On: 09/06/2015 03:19   Ct Biopsy  09/08/2015  CLINICAL DATA:  Left retroperitoneal mass.  Left testicular mass. EXAM: CT-GUIDED BIOPSY LEFT RETROPERITONEAL MASS.  CORE. MEDICATIONS AND MEDICAL HISTORY: Versed 1 mg, Fentanyl 25 mcg. Additional Medications: None. ANESTHESIA/SEDATION: Moderate sedation time: 6 minutes PROCEDURE: The procedure, risks, benefits, and  alternatives were explained to the patient. Questions regarding the procedure were encouraged and answered. The patient understands and consents to the procedure. The back was prepped with ChloraPrep in a sterile fashion, and a sterile drape was applied covering the operative field. A sterile gown and sterile gloves were used for the procedure. Under CT guidance, a(n) 17 gauge guide needle was advanced into the left retroperitoneal mass. Subsequently 3 18 gauge core biopsies were obtained. The guide needle was removed. Final imaging was performed. Patient tolerated the procedure well without complication. Vital sign monitoring by nursing staff during the procedure will continue as patient is in the special procedures unit for post procedure observation. FINDINGS: The images document guide needle placement within the left retroperitoneal mass. Post biopsy images demonstrate no hemorrhage. COMPLICATIONS: None IMPRESSION: Successful CT-guided core biopsy of a left retroperitoneal mass. Electronically Signed   By: Marybelle Killings M.D.   On: 09/08/2015 16:50   Portable Chest 1 View  09/06/2015  CLINICAL DATA:  Fever. EXAM: PORTABLE CHEST 1 VIEW COMPARISON:  09/05/2015.  07/20/2004. FINDINGS: Mediastinum and hilar structures are normal. Heart size stable. Persistent right lower lobe subsegmental atelectasis and/or scarring. No pleural effusion pneumothorax. IMPRESSION: Stable right base subsegmental atelectasis and/or scarring. No acute cardiopulmonary disease. Electronically Signed   By: Marcello Moores  Register   On: 09/06/2015 07:19    Microbiology: Recent Results (from the past 240 hour(s))  Blood culture (routine x 2)     Status: None   Collection Time: 09/05/15  3:00 PM  Result Value Ref Range Status   Specimen Description BLOOD RIGHT ARM  Final   Special Requests BOTTLES DRAWN AEROBIC AND ANAEROBIC 5CC  Final   Culture NO GROWTH 5 DAYS  Final   Report Status 09/10/2015 FINAL  Final  Blood culture (routine x 2)      Status: None   Collection Time: 09/05/15  4:43 PM  Result Value Ref Range Status   Specimen Description BLOOD LEFT HAND  Final   Special Requests BOTTLES DRAWN AEROBIC AND ANAEROBIC 4CC  Final   Culture NO GROWTH 5 DAYS  Final   Report Status 09/10/2015 FINAL  Final  MRSA PCR Screening     Status: None   Collection Time: 09/05/15  8:28 PM  Result Value Ref Range Status   MRSA by PCR NEGATIVE NEGATIVE Final    Comment:        The GeneXpert MRSA Assay (FDA approved for NASAL specimens only), is one component of a comprehensive MRSA colonization surveillance program. It is not intended to diagnose MRSA infection nor to guide or monitor treatment for MRSA infections.   Urine culture     Status: None   Collection Time: 09/05/15 10:20 PM  Result Value Ref Range Status   Specimen Description URINE, RANDOM  Final   Special Requests NONE  Final   Culture 3,000 COLONIES/mL INSIGNIFICANT GROWTH  Final   Report Status 09/07/2015 FINAL  Final  Culture, blood (routine x 2)     Status: None   Collection Time: 09/07/15  9:17 AM  Result Value Ref Range Status   Specimen Description BLOOD RIGHT ANTECUBITAL  Final   Special Requests BOTTLES DRAWN AEROBIC AND ANAEROBIC 5CC 10CC  Final   Culture NO GROWTH 5 DAYS  Final   Report Status 09/12/2015 FINAL  Final  Culture, blood (routine x 2)     Status: None   Collection Time: 09/07/15  9:27 AM  Result Value Ref Range Status   Specimen Description BLOOD RIGHT HAND  Final   Special Requests BOTTLES DRAWN AEROBIC AND ANAEROBIC 10CC  Final   Culture NO GROWTH 5 DAYS  Final   Report Status 09/12/2015 FINAL  Final  C difficile quick scan w PCR reflex     Status: None   Collection Time: 09/13/15  4:00 AM  Result Value Ref Range Status   C Diff antigen NEGATIVE NEGATIVE Final   C Diff toxin NEGATIVE NEGATIVE Final   C Diff interpretation Negative for toxigenic C. difficile  Final     Labs: Basic Metabolic Panel:  Recent Labs Lab  09/09/15 2148 09/10/15 0427 09/11/15 0258 09/12/15 0242 09/13/15 0600 09/14/15 0900  NA  --  139 137 138 140 139  K  --  4.4 4.6 4.4 4.2 3.9  CL  --  104 104 107 110 112*  CO2  --  20* 23 18* 19* 19*  GLUCOSE  --  264* 243* 281* 205* 112*  BUN  --  45* 45* 48* 49* 45*  CREATININE  --  2.93* 2.52* 2.35* 2.15* 1.99*  CALCIUM  --  8.1* 7.5* 7.5* 7.6* 8.5*  MG 2.0  --   --   --   --   --    Liver Function Tests:  Recent Labs Lab 09/08/15 0404 09/09/15 0353  AST 35 25  ALT 22 18  ALKPHOS 63 54  BILITOT 0.8 0.7  PROT 6.5 6.8  ALBUMIN 2.4* 2.3*   No results for input(s): LIPASE, AMYLASE in the last 168 hours. No results for input(s): AMMONIA in the last 168 hours. CBC:  Recent Labs Lab 09/08/15 0404 09/09/15 0353 09/13/15 0600  WBC 3.8* 5.8 9.2  HGB 9.2* 10.0* 8.7*  HCT 28.1* 30.6* 27.2*  MCV 77.0* 76.3* 78.6  PLT 189 229 265   Cardiac Enzymes: No results for input(s): CKTOTAL, CKMB, CKMBINDEX, TROPONINI in the last 168 hours. BNP: BNP (last 3 results) No results for input(s): BNP in the last 8760 hours.  ProBNP (last 3 results) No results for input(s): PROBNP in the last 8760 hours.  CBG:  Recent Labs Lab 09/13/15 1201 09/13/15 1727 09/13/15 2215 09/14/15 0733 09/14/15 1213  GLUCAP 125* 235* 289* 89 221*       Signed:  Dorma Altman  [facility] 09/14/2015, 1:08 PM

## 2015-09-14 NOTE — Progress Notes (Signed)
Subjective: Patient reports no new complaints or concerns. He denies any voiding issues. He has been transferred to [facility] long and our hope was to get him on the schedule for this week for orchiectomy and possible stent. There is currently no open surgical time and therefore he has been scheduled for Monday, October 24 with surgery by [name].  Objective: Vital signs in last 24 hours: Temp:  [97.4 F (36.3 C)-98 F (36.7 C)] 98 F (36.7 C) (10/20 0508) Pulse Rate:  [50-85] 50 (10/20 0508) Resp:  [16-24] 24 (10/20 0508) BP: (113-128)/(68-81) 113/68 mmHg (10/20 0508) SpO2:  [94 %-98 %] 96 % (10/20 0508) Weight:  [78.5 kg (173 lb 1 oz)] 78.5 kg (173 lb 1 oz) (10/20 0508)  Intake/Output from previous day: 10/19 0701 - 10/20 0700 In: 2412.5 [P.O.:1200; I.V.:1212.5] Out: 1350 [Urine:1350] Intake/Output this shift:    Physical Exam:  Constitutional: Vital signs reviewed. WD WN in NAD   Eyes: PERRL, No scleral icterus.   Cardiovascular: RRR Pulmonary/Chest: Normal effort Abdominal: Soft. Non-tender, non-distended, bowel sounds are normal, no masses, organomegaly, or guarding present.  Genitourinary: No change Extremities: No cyanosis or edema   Lab Results:  Recent Labs  09/13/15 0600  HGB 8.7*  HCT 27.2*   BMET  Recent Labs  09/12/15 0242 09/13/15 0600  NA 138 140  K 4.4 4.2  CL 107 110  CO2 18* 19*  GLUCOSE 281* 205*  BUN 48* 49*  CREATININE 2.35* 2.15*  CALCIUM 7.5* 7.6*   No results for input(s): LABPT, INR in the last 72 hours. No results for input(s): LABURIN in the last 72 hours. Results for orders placed or performed during the hospital encounter of 09/05/15  Blood culture (routine x 2)     Status: None   Collection Time: 09/05/15  3:00 PM  Result Value Ref Range Status   Specimen Description BLOOD RIGHT ARM  Final   Special Requests BOTTLES DRAWN AEROBIC AND ANAEROBIC 5CC  Final   Culture NO GROWTH 5 DAYS  Final   Report Status 09/10/2015 FINAL   Final  Blood culture (routine x 2)     Status: None   Collection Time: 09/05/15  4:43 PM  Result Value Ref Range Status   Specimen Description BLOOD LEFT HAND  Final   Special Requests BOTTLES DRAWN AEROBIC AND ANAEROBIC 4CC  Final   Culture NO GROWTH 5 DAYS  Final   Report Status 09/10/2015 FINAL  Final  MRSA PCR Screening     Status: None   Collection Time: 09/05/15  8:28 PM  Result Value Ref Range Status   MRSA by PCR NEGATIVE NEGATIVE Final    Comment:        The GeneXpert MRSA Assay (FDA approved for NASAL specimens only), is one component of a comprehensive MRSA colonization surveillance program. It is not intended to diagnose MRSA infection nor to guide or monitor treatment for MRSA infections.   Urine culture     Status: None   Collection Time: 09/05/15 10:20 PM  Result Value Ref Range Status   Specimen Description URINE, RANDOM  Final   Special Requests NONE  Final   Culture 3,000 COLONIES/mL INSIGNIFICANT GROWTH  Final   Report Status 09/07/2015 FINAL  Final  Culture, blood (routine x 2)     Status: None   Collection Time: 09/07/15  9:17 AM  Result Value Ref Range Status   Specimen Description BLOOD RIGHT ANTECUBITAL  Final   Special Requests BOTTLES DRAWN AEROBIC AND ANAEROBIC  5CC 10CC  Final   Culture NO GROWTH 5 DAYS  Final   Report Status 09/12/2015 FINAL  Final  Culture, blood (routine x 2)     Status: None   Collection Time: 09/07/15  9:27 AM  Result Value Ref Range Status   Specimen Description BLOOD RIGHT HAND  Final   Special Requests BOTTLES DRAWN AEROBIC AND ANAEROBIC 10CC  Final   Culture NO GROWTH 5 DAYS  Final   Report Status 09/12/2015 FINAL  Final  C difficile quick scan w PCR reflex     Status: None   Collection Time: 09/13/15  4:00 AM  Result Value Ref Range Status   C Diff antigen NEGATIVE NEGATIVE Final   C Diff toxin NEGATIVE NEGATIVE Final   C Diff interpretation Negative for toxigenic C. difficile  Final    Studies/Results: No  results found.  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  Plan is for him to undergo radical left orchiectomy with cystoscopy and retrograde pyelography. If there is evidence of ureteral obstruction due to the retroperitoneal mass a double-J stent will be placed. Creatinine has been improving spontaneously.   LOS: 9 days   Tailor Lucking S 09/14/2015, 8:19 AM

## 2015-09-14 NOTE — Clinical Social Work Placement (Signed)
   CLINICAL SOCIAL WORK PLACEMENT  NOTE  Date:  09/14/2015  Patient Details  Name: Carlos Black MRN: [id number] Date of Birth: March 13, 1951  Clinical Social Work is seeking post-discharge placement for this patient at the [place] level of care (*CSW will initial, date and re-position this form in  chart as items are completed):  Yes   Patient/family provided with [place] Work Department's list of facilities offering this level of care within the geographic area requested by the patient (or if unable, by the patient's family).  Yes   Patient/family informed of their freedom to choose among providers that offer the needed level of care, that participate in Medicare, Medicaid or managed care program needed by the patient, have an available bed and are willing to accept the patient.  Yes   Patient/family informed of [facility]'s ownership interest in [facility] and [facility], as well as of the fact that they are under no obligation to receive care at these facilities.  PASRR submitted to EDS on       PASRR number received on       Existing PASRR number confirmed on 09/09/15     FL2 transmitted to all facilities in geographic area requested by pt/family on       FL2 transmitted to all facilities within larger geographic area on       Patient informed that his/her managed care company has contracts with or will negotiate with certain facilities, including the following:   [employer])     Yes   Patient/family informed of bed offers received.  Patient chooses bed at [facility]     Physician recommends and patient chooses bed at      Patient to be transferred to [facility] on 09[facility]14[facility]15.  Patient to be transferred to facility by Ambulance  Corey Harold)     Patient family notified on 09/14/15 of transfer.  Name of family member notified:  Pt and pt wife at bedside.      PHYSICIAN Please  prepare priority discharge summary, including medications, Please sign FL2, Please sign DNR, Please prepare prescriptions     Additional Comment:    _______________________________________________ Harlon Flor, Student-SW 09/14/2015, 2:17 PM

## 2015-09-14 NOTE — Telephone Encounter (Signed)
Per staff message and POF I have scheduled appts. Advised scheduler of appts and of MD visit to move. JMW

## 2015-09-14 NOTE — Progress Notes (Signed)
IP PROGRESS NOTE  Subjective:   He complains of pruritus this morning. No other complaint.  Objective: Vital signs in last 24 hours: Blood pressure 113/68, pulse 50, temperature 98 F (36.7 C), temperature source Oral, resp. rate 24, height 5\' 5"  (1.651 m), weight 173 lb 1 oz (78.5 kg), SpO2 96 %.  Intake/Output from previous day: 10/19 0701 - 10/20 0700 In: 2412.5 [P.O.:1200; I.V.:1212.5] Out: 1350 [Urine:1350]  Physical Exam: Lungs: Clear anteriorly Cardiac: Regular rate and rhythm Abdomen: A mass is palpable on deep palpation in the left mid to lower abdomen Vascular: No leg edema Neurologic: 4/5 strength in the left arm and hand, 3/5 strength at the left leg and foot Skin: Mild erythema at the left upper arm and thighs  Lab Results:  Recent Labs  09/13/15 0600  WBC 9.2  HGB 8.7*  HCT 27.2*  PLT 265    BMET  Recent Labs  09/12/15 0242 09/13/15 0600  NA 138 140  K 4.4 4.2  CL 107 110  CO2 18* 19*  GLUCOSE 281* 205*  BUN 48* 49*  CREATININE 2.35* 2.15*  CALCIUM 7.5* 7.6*   AFP-1.0, beta hCG-1  Studies/Results: Echocardiogram 09/11/2015-LVEF 60-65% Medications: I have reviewed the patient's current medications.  Assessment/Plan:  1. Left retroperitoneal mass, adrenal masses, left testicular mass, status post a CT-guided biopsy of the left retroperitoneal mass 09/08/2015-pathology confirmed large B-cell lymphoma, CD20 positive 2. Renal failure, left hydronephrosis, the creatinine has improved 3. Left hemiplegia-chronic following a CVA 4. Microcytic anemia 5. Fever-resolved on Decadron, Decadron discontinued 09/13/2015 6. Rash/pruritus-? Contact dermatitis versus drug rash  Carlos Black appears stable aside from the pruritus. The plan for discharge to a skilled nursing facility is noted. He is scheduled for the orchiectomy 09/18/2015. I will try to schedule the PET scan 4 09/09/2015 or 09/20/2015 with the plan to begin systemic therapy on  09/21/2015.   Recommendations: 1. Outpatient follow-up at the [facility] for an office visit, chemotherapy teaching class, and chemotherapy during the week of 09/18/2015 2. Check hepatitis B serology pre-rituximab-I will add labs from 09/13/2015    LOS: 9 days   Carlos Black  09/14/2015, 9:07 AM

## 2015-09-14 NOTE — Progress Notes (Signed)
CSW received authorization of pt insurance. Pt for dc today to [facility].  BSW Intern facilitated pt dc needs including contacting facility, faxing pt dc summary via [facility], BSW Intern and CSW met with pt and pt wife at bedside, and providing RN phone number to call Report.  CSW provided RN with [facility] number. RN to call [facility] to transport when medically ready.   No further social work needs identified.   BSW Intern signing off.  Harlon Flor, [place] Intern Clinical Social Work Department  [phone]

## 2015-09-14 NOTE — Telephone Encounter (Signed)
pof noted,inbasket to HIM as this is a new patient,inbasket to michelle to add 6hr tx 10/27

## 2015-09-14 NOTE — Progress Notes (Signed)
Physical Therapy Treatment Patient Details Name: Trevar Boehringer MRN: [id number] DOB: Nov 04, 1951 Today's Date: 09/14/2015    History of Present Illness 64 y o with history of CVA with residual left sided weakness, DM, HTN, CAD who presented to ED with sudden onset of left lower extremity numbness and left-sided weakness on the day of admission. Patient stated that he was extremely weak and unable to get off the commode and had to have 3 people help him up.    PT Comments    Progressing with mobility. Pt tolerated session well. Continue to recommend SNF  Follow Up Recommendations  SNF;Supervision/Assistance - 24 hour     Equipment Recommendations       Recommendations for Other Services OT consult     Precautions / Restrictions Precautions Precautions: Fall Precaution Comments: left hemiparesis from previous CVA Restrictions Weight Bearing Restrictions: No    Mobility  Bed Mobility               General bed mobility comments: Sitting in chair upon PT arrival.   Transfers Overall transfer level: Needs assistance Equipment used: Rolling walker (2 wheeled) Transfers: Sit to/from Stand Sit to Stand: +2 physical assistance;+2 safety/equipment;Mod assist         General transfer comment: Assist to rise, stabilize, position L hand on walker. Increased time. VCs hand placement.   Ambulation/Gait Ambulation/Gait assistance: +2 safety/equipment;Mod assist Ambulation Distance (Feet): 60 Feet Assistive device: Rolling walker (2 wheeled) Gait Pattern/deviations: Step-to pattern;Step-through pattern;Decreased stride length     General Gait Details: Assist to advance L LE, stabilize pt, maneuver safety with RW. L knee recurvatum noted during stance. VCs for safety, technique.    Stairs            Wheelchair Mobility    Modified Rankin (Stroke Patients Only)       Balance                                    Cognition Arousal/Alertness:  Awake/alert Behavior During Therapy: Flat affect;WFL for tasks assessed/performed Overall Cognitive Status: History of cognitive impairments - at baseline                      Exercises      General Comments        Pertinent Vitals/Pain Pain Assessment: No/denies pain    Home Living                      Prior Function            PT Goals (current goals can now be found in the care plan section) Progress towards PT goals: Progressing toward goals    Frequency  Min 4X/week    PT Plan Current plan remains appropriate    Co-evaluation             End of Session Equipment Utilized During Treatment: Gait belt (L AFO) Activity Tolerance: Patient tolerated treatment well Patient left: in chair;with call bell/phone within reach;with chair alarm set     Time: 4765-4650 PT Time Calculation (min) (ACUTE ONLY): 17 min  Charges:  $Gait Training: 8-22 mins                    G Codes:      Weston Anna, MPT Pager: [phone]

## 2015-09-14 NOTE — Telephone Encounter (Signed)
Appointments altered due to length of chemo,pateint will get appointments at d/c from hospital

## 2015-09-14 NOTE — Progress Notes (Signed)
CSW continuing to follow.  CSW received notification that pt chose bed at [facility].  CSW contacted [facility] and notified facility of acceptance of bed offer. [facility] stated that facility would initiate insurance authorization through [facility], but insurance will need up to date PT treatment note in order to provide to insurance company. CSW notified PT assigned to pt today.   CSW updated MD and will update MD regarding progress of [employer] authorization.   CSW to continue to follow.  Alison Murray, MSW, [place] Work [phone]

## 2015-09-15 ENCOUNTER — Other Ambulatory Visit: Payer: Self-pay | Admitting: Oncology

## 2015-09-15 ENCOUNTER — Other Ambulatory Visit (HOSPITAL_COMMUNITY): Payer: Self-pay | Admitting: *Deleted

## 2015-09-15 ENCOUNTER — Encounter (HOSPITAL_COMMUNITY): Payer: Self-pay | Admitting: *Deleted

## 2015-09-15 LAB — HEPATITIS B CORE ANTIBODY, TOTAL: Hep B Core Total Ab: NEGATIVE

## 2015-09-15 LAB — HEPATITIS B SURFACE ANTIGEN: Hepatitis B Surface Ag: NEGATIVE

## 2015-09-15 NOTE — Progress Notes (Addendum)
Dr Con Memos reviewed EKGs from 09/06/2015 and 12/23/2011 (on chart and in epic), ECHO 09/11/2015 (on chart and in epic), hospital admission note 09/05/2015 (on chart and in epic), CT chest report 09/07/2015 (on chart and in epic), and lab results 09/14/2015-BMP, and 09/13/2015 CBC (on chart and in epic). Pt to have EKG day of surgery 09/18/2015.

## 2015-09-15 NOTE — Progress Notes (Signed)
Preop instructions for Carlos Black    Date of Birth 01-10-1951  Date of Procedure 09-18-2015     Doctor :Carlos Black Time to arrive at Carlos Black 900 am Report to: Admitting   Procedure:Left orichectomy left cystoscopy, left stent placement   Do not eat or drink past midnight the night before your procedure.(To include any tube feedings-must be discontinued)  Take these morning medications only with sips of water.(or give through gastrostomy or feeding tube). Pantaprazole, Amlodipine, Atorvastatin  Dr. Karsten Black office to fax order for [facility] showers, see Carlos Black preparing for surgery instruction sheet enclosed.  Note: No Insulin or Diabetic meds should be given or taken the morning of the procedure!   [place] LPN  POEUM:[phone] FAX [place] POA:pt signs own consents Transportation contact phone#: FACILITY TO PROVIDE TRANSPORTATION PER WIFE Carlos Black, WIFE TO MEET PATIENT AT HOSPITAL ADMITTING 900 AM.   Please send day of procedure:current med list and meds last taken that day, confirm nothing by mouth status from what time, Patient Demographic info( to include DNR status, problem list, allergies)    Bring Insurance card and picture ID Leave all jewelry and other valuables at place where living( no metal or rings to be worn) No contact lens Women-no make-up, no lotions,perfumes,powders Men-no colognes,lotions  Any questions day of procedure,call short stay [phone]  Sent from :Carlos Black [facility]                   Carlos Black                   Fax:[phone]  Sent by :Carlos Black

## 2015-09-15 NOTE — Progress Notes (Signed)
Faxed pre op instructions to [facility] fax [phone] attention robert peyton LPN, spoke with Herbie Baltimore by phone all faxed instructions received and understood.

## 2015-09-15 NOTE — Progress Notes (Deleted)
Preop instructions WIO:MBTDHR Persons Date of Birth 1951-03-10  Date of Procedure:09-18-2015      Doctor:Ottelin_ Time to arrive at [place] am Arrive at [place] office   Procedure: Left Orichiectomy, Left cystoscopy, Left ureteral stent placement    Do not eat or drink past midnight the night before your procedure.(To include any tube feedings-must be discontinued)  Take these morning medications only with sips of water.(or give through gastrostomy or feeding tube).  Amlodipine, Pantaprazole, Atorvastatin Note: No Insulin or Diabetic meds should be given or taken the morning of the procedure! PLEASE SEE [place] PREPARING FOR SURGERY INSTRUCTIONS SHEET ENCLOSED. WIFE [name] IS TO MEET PATIENT AT [facility] ADMITTING 900AM DAY OF SURGERY, FACILITY IS TO PROVIDE TRANSPORTATION.   Facility contact:Robert Peyton LPN  CBULA:[phone] Health Care POA: patient signs own consents  Transportation contact phone#facility to provide transportation per wife Hassan Rowan.  Please send day of procedure:current med list and meds last taken that day, confirm nothing by mouth status from what time, Patient Demographic info( to include DNR status, problem list, allergies)   RN contact name/phone#:Robert Ria Clock LPN phone [phone] and Fax #:[phone]  [place] card and picture ID Leave all jewelry and other valuables at place where living( no metal or rings to be worn) No contact lens Women-no make-up, no lotions,perfumes,powders Men-no colognes,lotions  Any questions day of procedure,call  Short Stay [phone]   Sent from :[place] RN                   Nolan                  [phone]

## 2015-09-17 ENCOUNTER — Other Ambulatory Visit: Payer: Self-pay | Admitting: Oncology

## 2015-09-18 ENCOUNTER — Ambulatory Visit (HOSPITAL_COMMUNITY): Payer: Medicare HMO

## 2015-09-18 ENCOUNTER — Ambulatory Visit (HOSPITAL_COMMUNITY): Payer: Medicare HMO | Admitting: Certified Registered"

## 2015-09-18 ENCOUNTER — Encounter (HOSPITAL_COMMUNITY): Payer: Self-pay

## 2015-09-18 ENCOUNTER — Other Ambulatory Visit: Payer: Self-pay | Admitting: *Deleted

## 2015-09-18 ENCOUNTER — Encounter (HOSPITAL_COMMUNITY)
Admission: RE | Disposition: A | Discharge status: Home / Self Care | Payer: Self-pay | Source: Ambulatory Visit | Attending: Urology

## 2015-09-18 ENCOUNTER — Ambulatory Visit (HOSPITAL_COMMUNITY)
Admission: RE | Admit: 2015-09-18 | Discharge: 2015-09-18 | Disposition: A | Discharge status: Home / Self Care | Payer: Medicare HMO | Source: Ambulatory Visit | Attending: Urology | Admitting: Urology

## 2015-09-18 ENCOUNTER — Telehealth: Payer: Self-pay | Admitting: Oncology

## 2015-09-18 DIAGNOSIS — Z419 Encounter for procedure for purposes other than remedying health state, unspecified: Secondary | ICD-10-CM

## 2015-09-18 DIAGNOSIS — R9389 Abnormal findings on diagnostic imaging of other specified body structures: Secondary | ICD-10-CM

## 2015-09-18 HISTORY — PX: ORCHIECTOMY: SHX2116

## 2015-09-18 HISTORY — PX: CYSTOSCOPY W/ URETERAL STENT PLACEMENT: SHX1429

## 2015-09-18 LAB — GLUCOSE, CAPILLARY
Glucose-Capillary: 169 mg/dL — ABNORMAL HIGH (ref 65–99)
Glucose-Capillary: 159 mg/dL — ABNORMAL HIGH (ref 65–99)

## 2015-09-18 LAB — BASIC METABOLIC PANEL
Anion gap: 10 (ref 5–15)
BUN: 29 mg/dL — ABNORMAL HIGH (ref 6–20)
CO2: 23 mmol/L (ref 22–32)
Calcium: 8.5 mg/dL — ABNORMAL LOW (ref 8.9–10.3)
Chloride: 103 mmol/L (ref 101–111)
Creatinine, Ser: 3.35 mg/dL — ABNORMAL HIGH (ref 0.61–1.24)
GFR calc Af Amer: 21 mL/min — ABNORMAL LOW (ref >60)
GFR calc non Af Amer: 18 mL/min — ABNORMAL LOW (ref >60)
Glucose, Bld: 184 mg/dL — ABNORMAL HIGH (ref 65–99)
Potassium: 4.5 mmol/L (ref 3.5–5.1)
Sodium: 136 mmol/L (ref 135–145)

## 2015-09-18 LAB — CBC
HCT: 29.4 % — ABNORMAL LOW (ref 39.0–52.0)
Hemoglobin: 9.3 g/dL — ABNORMAL LOW (ref 13.0–17.0)
MCH: 25.1 pg — ABNORMAL LOW (ref 26.0–34.0)
MCHC: 31.6 g/dL (ref 30.0–36.0)
MCV: 79.5 fL (ref 78.0–100.0)
Platelets: 237 10*3/uL (ref 150–400)
RBC: 3.7 MIL/uL — ABNORMAL LOW (ref 4.22–5.81)
RDW: 17.7 % — ABNORMAL HIGH (ref 11.5–15.5)
WBC: 7.6 10*3/uL (ref 4.0–10.5)

## 2015-09-18 SURGERY — ORCHIECTOMY
Anesthesia: General | Laterality: Left

## 2015-09-18 MED ORDER — FENTANYL CITRATE (PF) 100 MCG/2ML IJ SOLN: 25.0000 ug | INTRAMUSCULAR | Status: DC | PRN

## 2015-09-18 MED ORDER — OXYCODONE HCL 5 MG PO TABS: 5.0000 mg | ORAL_TABLET | ORAL | Status: DC | PRN

## 2015-09-18 MED ORDER — LACTATED RINGERS IV SOLN
INTRAVENOUS | Status: DC
  Administered 2015-09-18: 14:00:00 via INTRAVENOUS
  Administered 2015-09-18: 1000 mL via INTRAVENOUS

## 2015-09-18 MED ORDER — CEFAZOLIN SODIUM-DEXTROSE 2-3 GM-% IV SOLR
2.0000 g | INTRAVENOUS | Status: AC
  Administered 2015-09-18: 2 g via INTRAVENOUS

## 2015-09-18 MED ORDER — OXYCODONE HCL 5 MG/5ML PO SOLN
5.0000 mg | Freq: Once | ORAL | Status: DC | PRN
  Filled 2015-09-18: qty 5

## 2015-09-18 MED ORDER — SUCCINYLCHOLINE CHLORIDE 20 MG/ML IJ SOLN
INTRAMUSCULAR | Status: DC | PRN
  Administered 2015-09-18: 70 mg via INTRAVENOUS

## 2015-09-18 MED ORDER — ACETAMINOPHEN 10 MG/ML IV SOLN
INTRAVENOUS | Status: DC | PRN
  Administered 2015-09-18: 1000 mg via INTRAVENOUS

## 2015-09-18 MED ORDER — OXYCODONE HCL 5 MG PO TABS: 5.0000 mg | ORAL_TABLET | ORAL | Status: AC | PRN

## 2015-09-18 MED ORDER — PROPOFOL 10 MG/ML IV BOLUS
INTRAVENOUS | Status: DC | PRN
  Administered 2015-09-18: 140 mg via INTRAVENOUS
  Administered 2015-09-18: 50 mg via INTRAVENOUS

## 2015-09-18 MED ORDER — ONDANSETRON HCL 4 MG/2ML IJ SOLN
INTRAMUSCULAR | Status: DC | PRN
  Administered 2015-09-18: 4 mg via INTRAVENOUS

## 2015-09-18 MED ORDER — ALLOPURINOL 300 MG PO TABS: 300.0000 mg | ORAL_TABLET | Freq: Every day | ORAL | Status: DC

## 2015-09-18 MED ORDER — CEFAZOLIN SODIUM-DEXTROSE 2-3 GM-% IV SOLR
INTRAVENOUS | Status: AC
  Filled 2015-09-18: qty 50

## 2015-09-18 MED ORDER — PROPOFOL 10 MG/ML IV BOLUS
INTRAVENOUS | Status: AC
  Filled 2015-09-18: qty 20

## 2015-09-18 MED ORDER — OXYCODONE HCL 5 MG PO TABS: 5.0000 mg | ORAL_TABLET | Freq: Once | ORAL | Status: DC | PRN

## 2015-09-18 MED ORDER — BUPIVACAINE HCL (PF) 0.5 % IJ SOLN
INTRAMUSCULAR | Status: AC
  Filled 2015-09-18: qty 30

## 2015-09-18 MED ORDER — FENTANYL CITRATE (PF) 100 MCG/2ML IJ SOLN
INTRAMUSCULAR | Status: DC | PRN
  Administered 2015-09-18: 25 ug via INTRAVENOUS
  Administered 2015-09-18: 50 ug via INTRAVENOUS
  Administered 2015-09-18 (×2): 25 ug via INTRAVENOUS
  Administered 2015-09-18: 50 ug via INTRAVENOUS
  Administered 2015-09-18: 25 ug via INTRAVENOUS

## 2015-09-18 MED ORDER — ONDANSETRON HCL 4 MG/2ML IJ SOLN
INTRAMUSCULAR | Status: AC
  Filled 2015-09-18: qty 2

## 2015-09-18 MED ORDER — FENTANYL CITRATE (PF) 100 MCG/2ML IJ SOLN
INTRAMUSCULAR | Status: AC
  Filled 2015-09-18: qty 4

## 2015-09-18 MED ORDER — LIDOCAINE HCL (CARDIAC) 20 MG/ML IV SOLN
INTRAVENOUS | Status: AC
  Filled 2015-09-18: qty 5

## 2015-09-18 MED ORDER — ACETAMINOPHEN 325 MG PO TABS
325.0000 mg | ORAL_TABLET | ORAL | Status: DC | PRN
Start: 2015-09-18 — End: 2015-09-18

## 2015-09-18 MED ORDER — LIDOCAINE HCL (CARDIAC) 20 MG/ML IV SOLN
INTRAVENOUS | Status: DC | PRN
  Administered 2015-09-18: 70 mg via INTRAVENOUS

## 2015-09-18 MED ORDER — BUPIVACAINE-EPINEPHRINE (PF) 0.5% -1:200000 IJ SOLN
INTRAMUSCULAR | Status: DC | PRN
  Administered 2015-09-18: 10 mL via PERINEURAL

## 2015-09-18 MED ORDER — IOHEXOL 300 MG/ML  SOLN
INTRAMUSCULAR | Status: DC | PRN
  Administered 2015-09-18: 10 mL

## 2015-09-18 MED ORDER — ACETAMINOPHEN 160 MG/5ML PO SOLN
325.0000 mg | ORAL | Status: DC | PRN
  Filled 2015-09-18: qty 20.3

## 2015-09-18 MED ORDER — ACETAMINOPHEN 10 MG/ML IV SOLN
INTRAVENOUS | Status: AC
  Filled 2015-09-18: qty 100

## 2015-09-18 MED ORDER — BUPIVACAINE-EPINEPHRINE 0.5% -1:200000 IJ SOLN: INTRAMUSCULAR | Status: DC | PRN

## 2015-09-18 SURGICAL SUPPLY — 51 items
ADH SKN CLS APL DERMABOND .7 (GAUZE/BANDAGES/DRESSINGS) ×1
APL SKNCLS STERI-STRIP NONHPOA (GAUZE/BANDAGES/DRESSINGS)
BAG URINE DRAINAGE (UROLOGICAL SUPPLIES) ×1 IMPLANT
BAG URO CATCHER STRL LF (DRAPE) ×2 IMPLANT
BASKET ZERO TIP NITINOL 2.4FR (BASKET) IMPLANT
BENZOIN TINCTURE PRP APPL 2/3 (GAUZE/BANDAGES/DRESSINGS) IMPLANT
BNDG GAUZE ELAST 4 BULKY (GAUZE/BANDAGES/DRESSINGS) ×1 IMPLANT
BSKT STON RTRVL ZERO TP 2.4FR (BASKET)
CATH FOLEY 2W COUNCIL 5CC 18FR (CATHETERS) ×1 IMPLANT
CATH INTERMIT  6FR 70CM (CATHETERS) ×2 IMPLANT
CLOTH BEACON ORANGE TIMEOUT ST (SAFETY) ×2 IMPLANT
COVER SURGICAL LIGHT HANDLE (MISCELLANEOUS) ×3 IMPLANT
DERMABOND ADVANCED (GAUZE/BANDAGES/DRESSINGS) ×1
DERMABOND ADVANCED .7 DNX12 (GAUZE/BANDAGES/DRESSINGS) IMPLANT
DRAIN PENROSE 18X1/2 LTX STRL (DRAIN) ×1 IMPLANT
DRAIN PENROSE 18X1/4 LTX STRL (WOUND CARE) ×2 IMPLANT
DRAPE LAPAROTOMY T 98X78 PEDS (DRAPES) ×2 IMPLANT
ELECT REM PT RETURN 9FT ADLT (ELECTROSURGICAL) ×2
ELECTRODE REM PT RTRN 9FT ADLT (ELECTROSURGICAL) ×1 IMPLANT
GAUZE SPONGE 4X4 12PLY STRL (GAUZE/BANDAGES/DRESSINGS) IMPLANT
GLOVE BIOGEL M 8.0 STRL (GLOVE) ×3 IMPLANT
GOWN STRL REUS W/ TWL XL LVL3 (GOWN DISPOSABLE) ×1 IMPLANT
GOWN STRL REUS W/TWL XL LVL3 (GOWN DISPOSABLE) ×7 IMPLANT
GUIDEWIRE ANG ZIPWIRE 038X150 (WIRE) IMPLANT
GUIDEWIRE STR DUAL SENSOR (WIRE) ×1 IMPLANT
HOLDER FOLEY CATH W/STRAP (MISCELLANEOUS) ×1 IMPLANT
KIT BASIN OR (CUSTOM PROCEDURE TRAY) ×2 IMPLANT
LIQUID BAND (GAUZE/BANDAGES/DRESSINGS) ×2 IMPLANT
MANIFOLD NEPTUNE II (INSTRUMENTS) ×2 IMPLANT
NEEDLE HYPO 22GX1.5 SAFETY (NEEDLE) ×2 IMPLANT
NS IRRIG 1000ML POUR BTL (IV SOLUTION) ×3 IMPLANT
PACK CYSTO (CUSTOM PROCEDURE TRAY) ×2 IMPLANT
PACK GENERAL/GYN (CUSTOM PROCEDURE TRAY) ×2 IMPLANT
PLUG CATH AND CAP STER (CATHETERS) ×1 IMPLANT
STENT CONTOUR 6FRX26X.038 (STENTS) ×1 IMPLANT
STRIP CLOSURE SKIN 1/2X4 (GAUZE/BANDAGES/DRESSINGS) IMPLANT
SUPPORT SCROTAL LG STRP (MISCELLANEOUS) ×2 IMPLANT
SUT MNCRL AB 4-0 PS2 18 (SUTURE) ×2 IMPLANT
SUT SILK 0 (SUTURE) ×2
SUT SILK 0 30XBRD TIE 6 (SUTURE) ×1 IMPLANT
SUT SILK 0 SH 30 (SUTURE) ×1 IMPLANT
SUT SILK 2 0 (SUTURE) ×2
SUT SILK 2-0 18XBRD TIE 12 (SUTURE) ×1 IMPLANT
SUT SILK 3 0 SH 30 (SUTURE) ×2 IMPLANT
SUT VIC AB 2-0 SH 27 (SUTURE) ×2
SUT VIC AB 2-0 SH 27X BRD (SUTURE) IMPLANT
SUT VIC AB 3-0 SH 27 (SUTURE)
SUT VIC AB 3-0 SH 27XBRD (SUTURE) IMPLANT
SYR CONTROL 10ML LL (SYRINGE) ×4 IMPLANT
TOWEL OR 17X26 10 PK STRL BLUE (TOWEL DISPOSABLE) ×3 IMPLANT
TUBING CONNECTING 10 (TUBING) ×2 IMPLANT

## 2015-09-18 NOTE — Discharge Instructions (Signed)
Scrotal surgery postoperative instructions  Wound:  In most cases your incision will have absorbable sutures that will dissolve within the first 10-20 days. Some will fall out even earlier. Expect some redness as the sutures dissolved but this should occur only around the sutures. If there is generalized redness, especially with increasing pain or swelling, let us know. The scrotum will very likely get "black and blue" as the blood in the tissues spread. Sometimes the whole scrotum will turn colors. The black and blue is followed by a yellow and brown color. In time, all the discoloration will go away. In some cases some firm swelling in the area of the testicle may persist for up to 4-6 weeks after the surgery and is considered normal in most cases.  Diet:  You may return to your normal diet within 24 hours following your surgery. You may note some mild nausea and possibly vomiting the first 6-8 hours following surgery. This is usually due to the side effects of anesthesia, and will disappear quite soon. I would suggest clear liquids and a very light meal the first evening following your surgery.  Activity:  Your physical activity should be restricted the first 48 hours. During that time you should remain relatively inactive, moving about only when necessary. During the first 7-10 days following surgery he should avoid lifting any heavy objects (anything greater than 15 pounds), and avoid strenuous exercise. If you work, ask [place] specifically about your restrictions, both for work and home. We will write a note to your employer if needed.  You should plan to wear a tight pair of jockey shorts or an athletic supporter for the first 4-5 days, even to sleep. This will keep the scrotum immobilized to some degree and keep the swelling down.  Ice packs should be placed on and off over the scrotum for the first 48 hours. Frozen peas or corn in a ZipLock bag can be frozen, used and re-frozen. Fifteen minutes  on and 15 minutes off is a reasonable schedule. The ice is a good pain reliever and keeps the swelling down.  Hygiene:  You may shower 48 hours after your surgery. Tub bathing should be restricted until the seventh day.          Medication:  You will be sent home with some type of pain medication. In many cases you will be sent home with a narcotic pain pill (hydrococone or oxycodone). If the pain is not too bad, you may take either Tylenol (acetaminophen) or Advil (ibuprofen) which contain no narcotic agents, and might be tolerated a little better, with fewer side effects. If the pain medication you are sent home with does not control the pain, you will have to let us know. Some narcotic pain medications cannot be given or refilled by a phone call to a pharmacy.  Problems you should report to [place]:   Fever of 101.0 degrees Fahrenheit or greater.  Moderate or severe swelling under the skin incision or involving the scrotum.  Drug reaction such as hives, a rash, nausea or vomiting.  Post stent placement instructions   Definitions:  Ureter: The duct that transports urine from the kidney to the bladder. Stent: A plastic hollow tube that is placed into the ureter, from the kidney to the bladder to prevent the ureter from swelling shut.  General instructions:  Despite the fact that no skin incisions were used, the area around the ureter and bladder is raw and irritated. The stent is a foreign  body which can further irritate the bladder wall. This irritation is manifested by increased frequency of urination, both day and night, and by an increase in the urge to urinate. In some, the urge to urinate is present almost always. Sometimes the urge is strong enough that you may not be able to stop your self from urinating. This can often be controlled with medication but does not occur in everyone. A stent can safely be left in place for 3 months or greater.  You may see some blood in your  urine while the stent is in place and a few days afterward. Do not be alarmed, even if the urine is clear for a while. Get off your feet and drink lots of fluids until clearing occurs. If you start to pass clots or don't improve, call us.  Diet:  You may return to your normal diet immediately. Because of the raw surface of your bladder, alcohol, spicy foods, foods high in acid and drinks with caffeine may cause irritation or frequency and should be used in moderation. To keep your urine flowing freely and avoid constipation, drink plenty of fluids during the day (8-10 glasses). Tip: Avoid cranberry juice because it is very acidic.  Activity:  Your physical activity doesn't need to be restricted. However, if you are very active, you may see some blood in the urine. We suggest that you reduce your activity under the circumstances until the bleeding has stopped.  Bowels:  It is important to keep your bowels regular during the postoperative period. Straining with bowel movements can cause bleeding. A bowel movement every other day is reasonable. Use a mild laxative if needed, such as milk of magnesia 2-3 tablespoons, or 2 Dulcolax tablets. Call if you continue to have problems. If you had been taking narcotics for pain, before, during or after your surgery, you may be constipated. Take a laxative if necessary.  Medication:  You should resume your pre-surgery medications unless told not to. In addition you may be given an antibiotic to prevent or treat infection. Antibiotics are not always necessary. All medication should be taken as prescribed until the bottles are finished unless you are having an unusual reaction to one of the drugs.  Problems you should report to [place]:  a. Fever greater than 101F. b. Heavy bleeding, or clots (see notes above about blood in urine). c. Inability to urinate. d. Drug reactions (hives, rash, nausea, vomiting, diarrhea). e. Severe burning or pain with urination that  is not improving.

## 2015-09-18 NOTE — Interval H&P Note (Signed)
History and Physical Interval Note:  09/18/2015 4:06 AM  Carlos Black  has presented today for surgery, with the diagnosis of LEFT TESTICULAR TUMOR, LEFT HYDRONEPHROSIS  The various methods of treatment have been discussed with the patient and family. After consideration of risks, benefits and other options for treatment, the patient has consented to  Procedure(s): LEFT ORCHIECTOMY (Left) CYSTOSCOPY WITH LEFT RETROGRADE PYELOGRAM/URETERAL STENT PLACEMENT (Left) as a surgical intervention .  The patient's history has been reviewed, patient examined, no change in status, stable for surgery.  I have reviewed the patient's chart and labs.  Questions were answered to the patient's satisfaction.     Claybon Jabs

## 2015-09-18 NOTE — H&P (View-Only) (Signed)
Urology Consult  Referring physician: Dr. Benay Spice Reason for referral: Retroperitoneal mass, left testicular mass, left hydronephrosis  History of Present Illness: Carlos Black is a 64 year old male with multiple medical issues who was admitted several days ago with left-sided weakness and is now been diagnosed with a large retroperitoneal mass. He receives his primary care through Dr. Lavone Orn and is seen regularly primarily for diabetes mellitus. Recently he has experienced episodes of lower extremity weakness. Recently he was unable to stand after having a bowel movement and was brought to the [facility] He has had an extensive evaluation with a multitude of imaging studies. These have been reviewed and appropriate images reviewed. Initially urology was contacted yesterday with concerns over hydronephrosis. The urologist on call reviewed an ultrasound which revealed no hydronephrosis and did not feel there was any need for urologic assessment. In the interim some additional information and imaging has come to light. In addition to a very large retroperitoneal mass the patient has undergone clinical exam which revealed a left-sided testicular mass confirmed on scrotal ultrasonography. In addition CT imaging does suggest at least some degree of left-sided hydronephrosis probably secondary to extrinsic compression of the ureter. The patient has significant chronic renal insufficiency with a creatinine of approximately 3. Renal ultrasound suggested an echo pattern consistent with medical renal disease. There is no evidence of right-sided hydronephrosis. The patient has subsequently undergone percutaneous biopsy of the retroperitoneal mass this afternoon. Results are pending. He complains of ongoing lower extremity weakness but has no other significant complaints. He has had some mild abdominal bloating but no abdominal flank or back discomfort at this time. He stills me he has had some issues with  fecal incontinence. He is currently wearing a condom catheter for urinary drainage but denies any urinary incontinence. He's had no gross hematuria and denies any real voiding complaints. 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 Beta hCG and alpha-fetoprotein levels were normal.  Past Medical History  Diagnosis Date  . Hypertension   . Coronary artery disease   . Myocardial infarction ([place])   . CAD (coronary artery disease) 09/05/2015  . SIRS (systemic inflammatory response syndrome) ([place]) 09/06/2015  . Acute renal failure (ARF) ([place]) 09/06/2015  . Type II diabetes mellitus ([place])   . Stroke [facility]) 2004    w/left sided weakness/notes 09/05/2015   Past Surgical History  Procedure Laterality Date  . Basilar arterty stent  07/25/2004     Dr. Abel Presto Deveshwar/notes 02/01/2011    Medications:  Scheduled: . aspirin  81 mg Oral Daily  . dexamethasone  4 mg Intravenous 4 times per day  . feeding supplement (GLUCERNA SHAKE)  237 mL Oral TID BM  . fentaNYL      . fluconazole (DIFLUCAN) IV  100 mg Intravenous Q24H  . guaiFENesin  1,200 mg Oral BID  . heparin  5,000 Units Subcutaneous 3 times per day  . insulin aspart  0-9 Units Subcutaneous TID WC  . lidocaine      . midazolam      . pantoprazole (PROTONIX) IV  40 mg Intravenous Q24H  . piperacillin-tazobactam (ZOSYN)  IV  3.375 g Intravenous 3 times per day  . sodium chloride  3 mL Intravenous Q12H  . vancomycin  750 mg Intravenous Q24H    Allergies: No Known Allergies  Family History  Problem Relation Age of Onset  . Diabetes Mellitus II Mother   . Pneumonia Father   .  Diabetes Father     Social History:  reports that he has never smoked. He does not have any smokeless tobacco history on file. He reports that he does not drink alcohol or use illicit drugs.  ROS positive for above-mentioned symptoms in history of present illness. Patient denies any other  current complaints at this time.  Physical Exam:  Vital signs in last 24 hours: Temp:  [97.6 F (36.4 C)-98.1 F (36.7 C)] 97.6 F (36.4 C) (10/14 0620) Pulse Rate:  [78-94] 88 (10/14 1531) Resp:  [16-28] 16 (10/14 1531) BP: (99-122)/(65-79) 122/67 mmHg (10/14 1531) SpO2:  [91 %-99 %] 92 % (10/14 1531) Weight:  [77 kg (169 lb 12.1 oz)] 77 kg (169 lb 12.1 oz) (10/14 2620)  Constitutional: Vital signs reviewed. WD WN in NAD Head: Normocephalic and atraumatic   Eyes: PERRL, No scleral icterus.  Neck: Supple No  Gross JVD, mass, thyromegaly, or carotid bruit present.  Cardiovascular: RRR Pulmonary/Chest: Normal effort Abdominal: Soft. Non-tender slight abdominal distention consistent with mild ileus. No real tenderness. Genitourinary: Normal external telemetry with condom catheter. Left testicle enlarged with exam consistent with testicular neoplasm Extremities: No cyanosis or edema  Neurological: Grossly non-focal questionable general lower extremity weakness with some left-sided decreased sensation Skin: Warm,very dry and intact. No rash, cyanosis   Laboratory Data:  Results for orders placed or performed during the hospital encounter of 09/05/15 (from the past 72 hour(s))  Glucose, capillary     Status: Abnormal   Collection Time: 09/05/15  8:12 PM  Result Value Ref Range   Glucose-Capillary 120 (H) 65 - 99 mg/dL   Comment 1 Capillary Specimen   MRSA PCR Screening     Status: None   Collection Time: 09/05/15  8:28 PM  Result Value Ref Range   MRSA by PCR NEGATIVE NEGATIVE    Comment:        The GeneXpert MRSA Assay (FDA approved for NASAL specimens only), is one component of a comprehensive MRSA colonization surveillance program. It is not intended to diagnose MRSA infection nor to guide or monitor treatment for MRSA infections.   Hemoglobin A1c     Status: Abnormal   Collection Time: 09/05/15  8:43 PM  Result Value Ref Range   Hgb A1c MFr Bld 6.2 (H) 4.8 - 5.6 %     Comment: (NOTE)         Pre-diabetes: 5.7 - 6.4         Diabetes: >6.4         Glycemic control for adults with diabetes: <7.0    Mean Plasma Glucose 131 mg/dL    Comment: (NOTE) Performed At: [facility] [place], [place] [id number] Lindon Romp MD AG:[id number]   Magnesium     Status: None   Collection Time: 09/05/15  8:43 PM  Result Value Ref Range   Magnesium 1.8 1.7 - 2.4 mg/dL  TSH     Status: None   Collection Time: 09/05/15  8:43 PM  Result Value Ref Range   TSH 2.894 0.350 - 4.500 uIU/mL  Basic metabolic panel     Status: Abnormal   Collection Time: 09/05/15  8:43 PM  Result Value Ref Range   Sodium 132 (L) 135 - 145 mmol/L   Potassium 5.0 3.5 - 5.1 mmol/L   Chloride 98 (L) 101 - 111 mmol/L   CO2 23 22 - 32 mmol/L   Glucose, Bld 117 (H) 65 - 99 mg/dL   BUN 32 (H) 6 -  20 mg/dL   Creatinine, Ser 3.24 (H) 0.61 - 1.24 mg/dL   Calcium 8.2 (L) 8.9 - 10.3 mg/dL   GFR calc non Af Amer 19 (L) >60 mL/min   GFR calc Af Amer 22 (L) >60 mL/min    Comment: (NOTE) The eGFR has been calculated using the CKD EPI equation. This calculation has not been validated in all clinical situations. eGFR's persistently <60 mL/min signify possible Chronic Kidney Disease.    Anion gap 11 5 - 15  Urinalysis, Routine w reflex microscopic (not at [facility])     Status: Abnormal   Collection Time: 09/05/15 10:20 PM  Result Value Ref Range   Color, Urine YELLOW YELLOW   APPearance CLOUDY (A) CLEAR   Specific Gravity, Urine 1.016 1.005 - 1.030   pH 5.0 5.0 - 8.0   Glucose, UA NEGATIVE NEGATIVE mg/dL   Hgb urine dipstick MODERATE (A) NEGATIVE   Bilirubin Urine NEGATIVE NEGATIVE   Ketones, ur 15 (A) NEGATIVE mg/dL   Protein, ur 100 (A) NEGATIVE mg/dL   Urobilinogen, UA 1.0 0.0 - 1.0 mg/dL   Nitrite NEGATIVE NEGATIVE   Leukocytes, UA SMALL (A) NEGATIVE  Urine culture     Status: None   Collection Time: 09/05/15 10:20 PM  Result Value Ref Range   Specimen Description  URINE, RANDOM    Special Requests NONE    Culture 3,000 COLONIES/mL INSIGNIFICANT GROWTH    Report Status 09/07/2015 FINAL   Sodium, urine, random     Status: None   Collection Time: 09/05/15 10:20 PM  Result Value Ref Range   Sodium, Ur 75 mmol/L  Creatinine, urine, random     Status: None   Collection Time: 09/05/15 10:20 PM  Result Value Ref Range   Creatinine, Urine 176.86 mg/dL  Urine microscopic-add on     Status: Abnormal   Collection Time: 09/05/15 10:20 PM  Result Value Ref Range   Squamous Epithelial / LPF RARE RARE   WBC, UA 11-20 <3 WBC/hpf   RBC / HPF 11-20 <3 RBC/hpf   Bacteria, UA FEW (A) RARE   Crystals URIC ACID CRYSTALS (A) NEGATIVE  Lactic acid, plasma     Status: None   Collection Time: 09/05/15 10:33 PM  Result Value Ref Range   Lactic Acid, Venous 1.7 0.5 - 2.0 mmol/L  Glucose, capillary     Status: Abnormal   Collection Time: 09/05/15 11:32 PM  Result Value Ref Range   Glucose-Capillary 109 (H) 65 - 99 mg/dL   Comment 1 Capillary Specimen   Magnesium     Status: None   Collection Time: 09/06/15  2:42 AM  Result Value Ref Range   Magnesium 2.3 1.7 - 2.4 mg/dL  Comprehensive metabolic panel     Status: Abnormal   Collection Time: 09/06/15  2:42 AM  Result Value Ref Range   Sodium 135 135 - 145 mmol/L   Potassium 5.0 3.5 - 5.1 mmol/L   Chloride 100 (L) 101 - 111 mmol/L   CO2 20 (L) 22 - 32 mmol/L   Glucose, Bld 116 (H) 65 - 99 mg/dL   BUN 31 (H) 6 - 20 mg/dL   Creatinine, Ser 3.14 (H) 0.61 - 1.24 mg/dL   Calcium 8.4 (L) 8.9 - 10.3 mg/dL   Total Protein 6.3 (L) 6.5 - 8.1 g/dL   Albumin 2.4 (L) 3.5 - 5.0 g/dL   AST 34 15 - 41 U/L   ALT 19 17 - 63 U/L   Alkaline Phosphatase 61 38 - 126  U/L   Total Bilirubin 1.0 0.3 - 1.2 mg/dL   GFR calc non Af Amer 19 (L) >60 mL/min   GFR calc Af Amer 22 (L) >60 mL/min    Comment: (NOTE) The eGFR has been calculated using the CKD EPI equation. This calculation has not been validated in all clinical  situations. eGFR's persistently <60 mL/min signify possible Chronic Kidney Disease.    Anion gap 15 5 - 15  CBC     Status: Abnormal   Collection Time: 09/06/15  2:42 AM  Result Value Ref Range   WBC 4.2 4.0 - 10.5 K/uL   RBC 3.97 (L) 4.22 - 5.81 MIL/uL   Hemoglobin 9.8 (L) 13.0 - 17.0 g/dL   HCT 30.7 (L) 39.0 - 52.0 %   MCV 77.3 (L) 78.0 - 100.0 fL   MCH 24.7 (L) 26.0 - 34.0 pg   MCHC 31.9 30.0 - 36.0 g/dL   RDW 16.2 (H) 11.5 - 15.5 %   Platelets 205 150 - 400 K/uL  Glucose, capillary     Status: None   Collection Time: 09/06/15  6:50 AM  Result Value Ref Range   Glucose-Capillary 90 65 - 99 mg/dL   Comment 1 Capillary Specimen   Glucose, capillary     Status: None   Collection Time: 09/06/15 12:05 PM  Result Value Ref Range   Glucose-Capillary 79 65 - 99 mg/dL   Comment 1 Capillary Specimen   Glucose, capillary     Status: None   Collection Time: 09/06/15  4:29 PM  Result Value Ref Range   Glucose-Capillary 99 65 - 99 mg/dL   Comment 1 Capillary Specimen   Glucose, capillary     Status: Abnormal   Collection Time: 09/06/15  9:07 PM  Result Value Ref Range   Glucose-Capillary 105 (H) 65 - 99 mg/dL   Comment 1 Notify RN    Comment 2 Document in Chart   Glucose, capillary     Status: None   Collection Time: 09/07/15  6:23 AM  Result Value Ref Range   Glucose-Capillary 72 65 - 99 mg/dL   Comment 1 Notify RN    Comment 2 Document in Chart   Comprehensive metabolic panel     Status: Abnormal   Collection Time: 09/07/15  9:17 AM  Result Value Ref Range   Sodium 135 135 - 145 mmol/L   Potassium 5.2 (H) 3.5 - 5.1 mmol/L   Chloride 105 101 - 111 mmol/L   CO2 21 (L) 22 - 32 mmol/L   Glucose, Bld 103 (H) 65 - 99 mg/dL   BUN 24 (H) 6 - 20 mg/dL   Creatinine, Ser 3.01 (H) 0.61 - 1.24 mg/dL   Calcium 8.2 (L) 8.9 - 10.3 mg/dL   Total Protein 6.6 6.5 - 8.1 g/dL   Albumin 2.4 (L) 3.5 - 5.0 g/dL   AST 41 15 - 41 U/L   ALT 20 17 - 63 U/L   Alkaline Phosphatase 64 38 - 126 U/L    Total Bilirubin 1.0 0.3 - 1.2 mg/dL   GFR calc non Af Amer 20 (L) >60 mL/min   GFR calc Af Amer 24 (L) >60 mL/min    Comment: (NOTE) The eGFR has been calculated using the CKD EPI equation. This calculation has not been validated in all clinical situations. eGFR's persistently <60 mL/min signify possible Chronic Kidney Disease.    Anion gap 9 5 - 15  Lactate dehydrogenase     Status: Abnormal   Collection Time:  09/07/15  9:17 AM  Result Value Ref Range   LDH 376 (H) 98 - 192 U/L  Uric acid     Status: None   Collection Time: 09/07/15  9:17 AM  Result Value Ref Range   Uric Acid, Serum 6.7 4.4 - 7.6 mg/dL  CBC with Differential/Platelet     Status: Abnormal   Collection Time: 09/07/15  9:17 AM  Result Value Ref Range   WBC 4.3 4.0 - 10.5 K/uL   RBC 3.70 (L) 4.22 - 5.81 MIL/uL   Hemoglobin 9.3 (L) 13.0 - 17.0 g/dL   HCT 28.6 (L) 39.0 - 52.0 %   MCV 77.3 (L) 78.0 - 100.0 fL   MCH 25.1 (L) 26.0 - 34.0 pg   MCHC 32.5 30.0 - 36.0 g/dL   RDW 16.5 (H) 11.5 - 15.5 %   Platelets 180 150 - 400 K/uL   Neutrophils Relative % 57 %   Neutro Abs 2.4 1.7 - 7.7 K/uL   Lymphocytes Relative 21 %   Lymphs Abs 0.9 0.7 - 4.0 K/uL   Monocytes Relative 18 %   Monocytes Absolute 0.8 0.1 - 1.0 K/uL   Eosinophils Relative 4 %   Eosinophils Absolute 0.2 0.0 - 0.7 K/uL   Basophils Relative 0 %   Basophils Absolute 0.0 0.0 - 0.1 K/uL  Culture, blood (routine x 2)     Status: None (Preliminary result)   Collection Time: 09/07/15  9:17 AM  Result Value Ref Range   Specimen Description BLOOD RIGHT ANTECUBITAL    Special Requests BOTTLES DRAWN AEROBIC AND ANAEROBIC 5CC 10CC    Culture NO GROWTH 1 DAY    Report Status PENDING   Culture, blood (routine x 2)     Status: None (Preliminary result)   Collection Time: 09/07/15  9:27 AM  Result Value Ref Range   Specimen Description BLOOD RIGHT HAND    Special Requests BOTTLES DRAWN AEROBIC AND ANAEROBIC 10CC    Culture NO GROWTH 1 DAY    Report Status  PENDING   Glucose, capillary     Status: Abnormal   Collection Time: 09/07/15 11:16 AM  Result Value Ref Range   Glucose-Capillary 113 (H) 65 - 99 mg/dL   Comment 1 Notify RN   Lactic acid, plasma     Status: None   Collection Time: 09/07/15 11:42 AM  Result Value Ref Range   Lactic Acid, Venous 1.8 0.5 - 2.0 mmol/L  Beta HCG, Quant (tumor marker)     Status: None   Collection Time: 09/07/15  4:16 PM  Result Value Ref Range   Beta hCG, Tumor Marker 1 0 - 3 mIU/mL    Comment: (NOTE) Roche ECLIA methodology Performed At: [facility] [place], [place] [id number] Lindon Romp MD [id number]   AFP tumor marker     Status: None   Collection Time: 09/07/15  4:16 PM  Result Value Ref Range   AFP-Tumor Marker 1.0 0.0 - 8.3 ng/mL    Comment: (NOTE) Roche ECLIA methodology Performed At: [facility] [place], [place] [id number] Lindon Romp MD [id number]   Glucose, capillary     Status: Abnormal   Collection Time: 09/07/15  5:09 PM  Result Value Ref Range   Glucose-Capillary 176 (H) 65 - 99 mg/dL   Comment 1 Notify RN    Comment 2 Document in Chart   Ferritin     Status: Abnormal   Collection Time: 09/07/15  6:40 PM  Result  Value Ref Range   Ferritin 766 (H) 24 - 336 ng/mL  Glucose, capillary     Status: Abnormal   Collection Time: 09/07/15  8:57 PM  Result Value Ref Range   Glucose-Capillary 276 (H) 65 - 99 mg/dL   Comment 1 Notify RN    Comment 2 Document in Chart   CBC     Status: Abnormal   Collection Time: 09/08/15  4:04 AM  Result Value Ref Range   WBC 3.8 (L) 4.0 - 10.5 K/uL   RBC 3.65 (L) 4.22 - 5.81 MIL/uL   Hemoglobin 9.2 (L) 13.0 - 17.0 g/dL   HCT 28.1 (L) 39.0 - 52.0 %   MCV 77.0 (L) 78.0 - 100.0 fL   MCH 25.2 (L) 26.0 - 34.0 pg   MCHC 32.7 30.0 - 36.0 g/dL   RDW 16.6 (H) 11.5 - 15.5 %   Platelets 189 150 - 400 K/uL  Comprehensive metabolic panel     Status: Abnormal   Collection Time: 09/08/15  4:04  AM  Result Value Ref Range   Sodium 133 (L) 135 - 145 mmol/L   Potassium 4.3 3.5 - 5.1 mmol/L    Comment: DELTA CHECK NOTED   Chloride 98 (L) 101 - 111 mmol/L   CO2 22 22 - 32 mmol/L   Glucose, Bld 225 (H) 65 - 99 mg/dL   BUN 30 (H) 6 - 20 mg/dL   Creatinine, Ser 3.19 (H) 0.61 - 1.24 mg/dL   Calcium 7.8 (L) 8.9 - 10.3 mg/dL   Total Protein 6.5 6.5 - 8.1 g/dL   Albumin 2.4 (L) 3.5 - 5.0 g/dL   AST 35 15 - 41 U/L   ALT 22 17 - 63 U/L   Alkaline Phosphatase 63 38 - 126 U/L   Total Bilirubin 0.8 0.3 - 1.2 mg/dL   GFR calc non Af Amer 19 (L) >60 mL/min   GFR calc Af Amer 22 (L) >60 mL/min    Comment: (NOTE) The eGFR has been calculated using the CKD EPI equation. This calculation has not been validated in all clinical situations. eGFR's persistently <60 mL/min signify possible Chronic Kidney Disease.    Anion gap 13 5 - 15  Glucose, capillary     Status: Abnormal   Collection Time: 09/08/15  6:18 AM  Result Value Ref Range   Glucose-Capillary 196 (H) 65 - 99 mg/dL  Glucose, capillary     Status: Abnormal   Collection Time: 09/08/15 11:21 AM  Result Value Ref Range   Glucose-Capillary 171 (H) 65 - 99 mg/dL   Comment 1 Notify RN   Glucose, capillary     Status: Abnormal   Collection Time: 09/08/15  4:45 PM  Result Value Ref Range   Glucose-Capillary 241 (H) 65 - 99 mg/dL   Recent Results (from the past 240 hour(s))  Blood culture (routine x 2)     Status: None (Preliminary result)   Collection Time: 09/05/15  3:00 PM  Result Value Ref Range Status   Specimen Description BLOOD RIGHT ARM  Final   Special Requests BOTTLES DRAWN AEROBIC AND ANAEROBIC 5CC  Final   Culture NO GROWTH 3 DAYS  Final   Report Status PENDING  Incomplete  Blood culture (routine x 2)     Status: None (Preliminary result)   Collection Time: 09/05/15  4:43 PM  Result Value Ref Range Status   Specimen Description BLOOD LEFT HAND  Final   Special Requests BOTTLES DRAWN AEROBIC AND ANAEROBIC 4CC  Final  Culture NO GROWTH 3 DAYS  Final   Report Status PENDING  Incomplete  MRSA PCR Screening     Status: None   Collection Time: 09/05/15  8:28 PM  Result Value Ref Range Status   MRSA by PCR NEGATIVE NEGATIVE Final    Comment:        The GeneXpert MRSA Assay (FDA approved for NASAL specimens only), is one component of a comprehensive MRSA colonization surveillance program. It is not intended to diagnose MRSA infection nor to guide or monitor treatment for MRSA infections.   Urine culture     Status: None   Collection Time: 09/05/15 10:20 PM  Result Value Ref Range Status   Specimen Description URINE, RANDOM  Final   Special Requests NONE  Final   Culture 3,000 COLONIES/mL INSIGNIFICANT GROWTH  Final   Report Status 09/07/2015 FINAL  Final  Culture, blood (routine x 2)     Status: None (Preliminary result)   Collection Time: 09/07/15  9:17 AM  Result Value Ref Range Status   Specimen Description BLOOD RIGHT ANTECUBITAL  Final   Special Requests BOTTLES DRAWN AEROBIC AND ANAEROBIC 5CC 10CC  Final   Culture NO GROWTH 1 DAY  Final   Report Status PENDING  Incomplete  Culture, blood (routine x 2)     Status: None (Preliminary result)   Collection Time: 09/07/15  9:27 AM  Result Value Ref Range Status   Specimen Description BLOOD RIGHT HAND  Final   Special Requests BOTTLES DRAWN AEROBIC AND ANAEROBIC 10CC  Final   Culture NO GROWTH 1 DAY  Final   Report Status PENDING  Incomplete   Creatinine:  Recent Labs  09/05/15 1500 09/05/15 2043 09/06/15 0242 09/07/15 0917 09/08/15 0404  CREATININE 3.44* 3.24* 3.14* 3.01* 3.19*   Baseline Creatinine:   Impression/Assessment:  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 Testicular cancer other than seminoma would be distinctly unusual in this age group. Seminoma is always negative for alpha-fetoprotein elevation and only shows elevation in beta  hCG 5-10% of the time. The patient does have significant chronic renal insufficiency. The left-sided hydronephrosis may be a contributing element bite given the lack of hydronephrosis on the right side his renal function is more likely elevated due to chronic renal disease secondary to his diabetes mellitus. His creatinine is trending downward slightly and therefore there is no urgent intervention needed with regard to stent placement. The patient will ultimately require a left radical orchiectomy. Concurrently he may require cystoscopy with retrograde pyelogram and stent placement. We had tentatively put him on the schedule for Monday morning to transfer to [facility] long for that procedure. I think it best at this point to delay a definitive procedure until we have the retroperitoneal biopsy results. A complete strategy for his care can then be established. There really is no urgent need to intervene with regard to what appears to be relatively mild left hydronephrosis.  Plan:  As above. We'll currently not plan for any surgical intervention until we have biopsy results on the retroperitoneal mass.  Cornel Werber S 09/08/2015, 8:11 PM      Beta-hCG

## 2015-09-18 NOTE — Op Note (Signed)
Preoperative diagnosis:  1. Left hydronephrosis 2. Left scrotal mass  Postoperative diagnosis: 1. Left hydronephrosis 2. Left scrotal mass 3. Urethral stricture, initial encounter  Procedure(s): 1. Cystoscopy, dilation of urethral stricture, initial encounter 2. Left retrograde pyelogram 3. Left JJ ureteral stent placement 6 x 26 without string 4. Left radical orchiectomy  Surgeon: Kathie Rhodes, MD Resident: Jearld Adjutant, MD   Anesthesia: general   Complications: none  EBL: 10 ml's  Specimens: Left testicle and spermatic cord  Disposition of specimens: Pathology  Intraoperative findings: obliterated prostatic urethra with a urethral channel at the 6 o'clock position, easily dilated to 28 french. Left RPG with mildly dilated and tortuous ureter to the level of the known proximal lymphoma with proximal dilation and hydronephrosis. Well sized 6 x 26 JJ stent. Left testicle was firm with palpable mass.   Indication: 64 y.o. male with left hydro, known lymphoma causing compression and hydro and a left scrotal mass, likely lymphoma, here for stent placement and radical orchiectomy.  Description of procedure: The patient was brought to the OR and placed under general anesthesia. The patient was then placed in dorsal lithotomy and prepped and draped for cystoscopy. Preoperative antibiotics were given and a timeout was performed.  We began with a 71 F rigid cystoscope and identified the obliterated urethra with no obvious channel. We used a sensor wire to gently probe suspicious areas and were able to pass the sensor wire at the 6 o'clock position. The placement in the bladder was confirmed on fluoroscopy and then a 6 french open ended was used to draw back urine. The wire was replaced and the patient was serially dilated with Michel Harrow dilators to 28 [place]. We then passed the cystoscope into the bladder over the wire.  Once in the bladder we identified the left UO and gently cannulated  this with an open ended and performed an RPG with findings above. We advanced a sensor wire to the level of the renal pelvis and removed the open ended. Over our sensor wire we passed a 6 x 26 JJ stent under direct and fluoroscopic vision and with removal of the wire saw a good curl in the renal pelvis and bladder. We then passed the sensor wire into the bladder, leaving this in place and removing the cystoscope. We then passed an 65 F council tip catheter over the wire into the bladder and filled the balloon with 10 cc's of sterile water before removing our sensor wire, draining the bladder and capping the foley.  At this time we moved the patient into supine position and re-prepped and draped for our orchiectomy. We performed another timeout and confirmed location and patient.   We began by making a 4 cm left inguinal incision over the external ring. We dissected down to the external oblique fascia and identified our external ring. There was an inguinal hernia identified at this time but we were able to retract the hernia out of our surgery site.   We were able to deliver the testicle out of the incision at this time without opening the inguinal canal. With the testicle free, we were able to get around the spermatic cord and place a penrose drain to provide a tourniquet around the cord.  We bluntly dissected the testicle free from scrotal attachments down to the gubernaculum. This was transected with electrocautery for hemostasis, paying careful attention not to button hole the scrotum.   With the testicle free of the scrotum and delivered out of the incision we  were able to gain length on our spermatic cord with blunt dissection of fascial and cremasteric attachments. With length to the peritoneal reflection with suture ligated the spermatic cord with 0 silk suture and then with a 0 silk tie. We then transected the cord distally and delivered the specimen for pathology. We cut the ties about 3-4 cm long  for later identification.   At this time we obtained hemostasis with electrocautery and irrigated the wound. We had good hemostasis so we instilled 0.25% Marcaine into the incision and began to close.   We used 3-0 vicryl to close the scarpa layer in a running fashion and then 4-0 Monocryl to close the skin in a subcuticular fashion. The wound was dressed with dermabond and scrotal fluffs and scrotal support were applied. The patient was awoken from anesthesia and transferred to PACU for recovery prior to discharge home.     I was present and participated in the entire operation I have made the necessary changes and/or additions to the above noted documentation as necessary, and agree with the documentation, as recorded by the resident.

## 2015-09-18 NOTE — Transfer of Care (Signed)
Immediate Anesthesia Transfer of Care Note  Patient: Carlos Black  Procedure(s) Performed: Procedure(s) (LRB): LEFT RADICAL ORCHIECTOMY (Left) CYSTOSCOPY WITH LEFT URETERAL DILATION, LEFT RETROGRADE PYELOGRAM/URETERAL STENT PLACEMENT (Left)  Patient Location: PACU  Anesthesia Type: General  Level of Consciousness: awake, sedated, patient cooperative and responds to stimulation  Airway & Oxygen Therapy: Patient Spontanous Breathing and Patient connected to face mask oxygen  Post-op Assessment: Report given to PACU RN, Post -op Vital signs reviewed and stable and Patient moving all extremities  Post vital signs: Reviewed and stable/ pale Fm 100 % monitored during transport to RR, feverous, ointment applied to cracked lips and sore   Complications: No apparent anesthesia complications

## 2015-09-18 NOTE — Progress Notes (Signed)
Called report to Control and instrumentation engineer at [employer]

## 2015-09-18 NOTE — Progress Notes (Signed)
[name] aware of stringy clots in cath. Orders given for irrigation. Also asked to call waiting room and speak with son. Given phone nuber for waiting room.

## 2015-09-18 NOTE — Anesthesia Postprocedure Evaluation (Signed)
  Anesthesia Post-op Note  Patient: Carlos Black  Procedure(s) Performed: Procedure(s) (LRB): LEFT RADICAL ORCHIECTOMY (Left) CYSTOSCOPY WITH LEFT URETERAL DILATION, LEFT RETROGRADE PYELOGRAM/URETERAL STENT PLACEMENT (Left)  Patient Location: PACU  Anesthesia Type: General  Level of Consciousness: awake and alert   Airway and Oxygen Therapy: Patient Spontanous Breathing  Post-op Pain: mild  Post-op Assessment: Post-op Vital signs reviewed, Patient's Cardiovascular Status Stable, Respiratory Function Stable, Patent Airway and No signs of Nausea or vomiting  Last Vitals:  Filed Vitals:   09/18/15 1500  BP: 131/78  Pulse: 98  Temp:   Resp:     Post-op Vital Signs: stable   Complications: No apparent anesthesia complications

## 2015-09-18 NOTE — Telephone Encounter (Signed)
Added inj for 10/29 per 10/24 pof. New pt - [facility] patient will be given schedule at 1st appointment 10/25.

## 2015-09-18 NOTE — Anesthesia Preprocedure Evaluation (Addendum)
Anesthesia Evaluation  Patient identified by MRN, date of birth, ID band Patient awake    Reviewed: Allergy & Precautions, NPO status , Patient's Chart, lab work & pertinent test results  History of Anesthesia Complications Negative for: history of anesthetic complications  Airway Mallampati: II  TM Distance: >3 FB Neck ROM: Full    Dental  (+) Chipped, Poor Dentition, Missing,    Pulmonary neg pulmonary ROS,    breath sounds clear to auscultation       Cardiovascular hypertension, Pt. on medications (-) angina+ CAD and + Past MI   Rhythm:Regular Rate:Tachycardia     Neuro/Psych  Headaches, CVA, Residual Symptoms negative psych ROS   GI/Hepatic negative GI ROS, Neg liver ROS,   Endo/Other  diabetes, Type 2  Renal/GU ARFRenal disease     Musculoskeletal   Abdominal   Peds  Hematology   Anesthesia Other Findings   Reproductive/Obstetrics                            Anesthesia Physical Anesthesia Plan  ASA: III  Anesthesia Plan: General   Post-op Pain Management:    Induction: Intravenous  Airway Management Planned: Oral ETT  Additional Equipment: None  Intra-op Plan:   Post-operative Plan: Extubation in OR  Informed Consent: I have reviewed the patients History and Physical, chart, labs and discussed the procedure including the risks, benefits and alternatives for the proposed anesthesia with the patient or authorized representative who has indicated his/her understanding and acceptance.   Dental advisory given  Plan Discussed with: CRNA and Surgeon  Anesthesia Plan Comments:         Anesthesia Quick Evaluation

## 2015-09-18 NOTE — Anesthesia Procedure Notes (Signed)
Procedure Name: Intubation Date/Time: 09/18/2015 12:56 PM Performed by: Justice Rocher Pre-anesthesia Checklist: Patient identified, Emergency Drugs available, Suction available and Patient being monitored Patient Re-evaluated:Patient Re-evaluated prior to inductionOxygen Delivery Method: Circle System Utilized Preoxygenation: Pre-oxygenation with 100% oxygen Intubation Type: IV induction Ventilation: Mask ventilation without difficulty Laryngoscope Size: Mac and 4 Grade View: Grade II Tube type: Oral Tube size: 7.5 mm Number of attempts: 1 Airway Equipment and Method: Stylet and Oral airway Placement Confirmation: ETT inserted through vocal cords under direct vision,  positive ETCO2 and breath sounds checked- equal and bilateral Secured at: 21 cm Tube secured with: Tape Dental Injury: Teeth and Oropharynx as per pre-operative assessment  Comments: All teeth poor condition, Pt stated many loose preop. Discolored with oral odor - lips cracked with open sore on top lip. Lubrication applied. Teeth as preop after oral intubation

## 2015-09-18 NOTE — Progress Notes (Signed)
Called [facility] for transport.  

## 2015-09-18 NOTE — Progress Notes (Signed)
Cath irrigated with 50 ml NS to clear clot. Fluid returned easily, no more clots seen. Urine clear yellow.

## 2015-09-19 ENCOUNTER — Other Ambulatory Visit: Payer: Medicare Other

## 2015-09-19 ENCOUNTER — Inpatient Hospital Stay (HOSPITAL_COMMUNITY): Payer: Medicare HMO

## 2015-09-19 ENCOUNTER — Ambulatory Visit (HOSPITAL_BASED_OUTPATIENT_CLINIC_OR_DEPARTMENT_OTHER): Payer: Medicare HMO | Admitting: Nurse Practitioner

## 2015-09-19 ENCOUNTER — Other Ambulatory Visit: Payer: Medicare HMO

## 2015-09-19 ENCOUNTER — Encounter (HOSPITAL_COMMUNITY): Payer: Self-pay | Admitting: Urology

## 2015-09-19 ENCOUNTER — Inpatient Hospital Stay (HOSPITAL_COMMUNITY)
Admission: AD | Admit: 2015-09-19 | Discharge: 2015-10-26 | DRG: 823 | Disposition: E | Payer: Medicare HMO | Source: Ambulatory Visit | Attending: Pulmonary Disease | Admitting: Pulmonary Disease

## 2015-09-19 VITALS — BP 103/63 | HR 113 | Temp 100.3°F | Resp 24

## 2015-09-19 DIAGNOSIS — Z5111 Encounter for antineoplastic chemotherapy: Secondary | ICD-10-CM | POA: Diagnosis not present

## 2015-09-19 DIAGNOSIS — N390 Urinary tract infection, site not specified: Secondary | ICD-10-CM | POA: Diagnosis present

## 2015-09-19 DIAGNOSIS — N184 Chronic kidney disease, stage 4 (severe): Secondary | ICD-10-CM | POA: Diagnosis present

## 2015-09-19 DIAGNOSIS — R531 Weakness: Secondary | ICD-10-CM | POA: Diagnosis present

## 2015-09-19 DIAGNOSIS — N179 Acute kidney failure, unspecified: Secondary | ICD-10-CM

## 2015-09-19 DIAGNOSIS — D638 Anemia in other chronic diseases classified elsewhere: Secondary | ICD-10-CM | POA: Diagnosis not present

## 2015-09-19 DIAGNOSIS — I2510 Atherosclerotic heart disease of native coronary artery without angina pectoris: Secondary | ICD-10-CM | POA: Diagnosis present

## 2015-09-19 DIAGNOSIS — R6521 Severe sepsis with septic shock: Secondary | ICD-10-CM | POA: Diagnosis not present

## 2015-09-19 DIAGNOSIS — E2740 Unspecified adrenocortical insufficiency: Secondary | ICD-10-CM | POA: Diagnosis present

## 2015-09-19 DIAGNOSIS — D649 Anemia, unspecified: Secondary | ICD-10-CM | POA: Diagnosis not present

## 2015-09-19 DIAGNOSIS — R509 Fever, unspecified: Secondary | ICD-10-CM | POA: Diagnosis not present

## 2015-09-19 DIAGNOSIS — T380X5A Adverse effect of glucocorticoids and synthetic analogues, initial encounter: Secondary | ICD-10-CM | POA: Diagnosis present

## 2015-09-19 DIAGNOSIS — Z79899 Other long term (current) drug therapy: Secondary | ICD-10-CM | POA: Diagnosis not present

## 2015-09-19 DIAGNOSIS — R100 Acute abdomen: Secondary | ICD-10-CM | POA: Diagnosis not present

## 2015-09-19 DIAGNOSIS — R109 Unspecified abdominal pain: Secondary | ICD-10-CM

## 2015-09-19 DIAGNOSIS — C8338 Diffuse large B-cell lymphoma, lymph nodes of multiple sites: Principal | ICD-10-CM | POA: Diagnosis present

## 2015-09-19 DIAGNOSIS — E875 Hyperkalemia: Secondary | ICD-10-CM | POA: Diagnosis not present

## 2015-09-19 DIAGNOSIS — N138 Other obstructive and reflux uropathy: Secondary | ICD-10-CM | POA: Diagnosis present

## 2015-09-19 DIAGNOSIS — E43 Unspecified severe protein-calorie malnutrition: Secondary | ICD-10-CM | POA: Diagnosis present

## 2015-09-19 DIAGNOSIS — E860 Dehydration: Secondary | ICD-10-CM | POA: Diagnosis not present

## 2015-09-19 DIAGNOSIS — Z01818 Encounter for other preprocedural examination: Secondary | ICD-10-CM

## 2015-09-19 DIAGNOSIS — N509 Disorder of male genital organs, unspecified: Secondary | ICD-10-CM | POA: Diagnosis not present

## 2015-09-19 DIAGNOSIS — I69354 Hemiplegia and hemiparesis following cerebral infarction affecting left non-dominant side: Secondary | ICD-10-CM

## 2015-09-19 DIAGNOSIS — R5081 Fever presenting with conditions classified elsewhere: Secondary | ICD-10-CM | POA: Diagnosis present

## 2015-09-19 DIAGNOSIS — E1165 Type 2 diabetes mellitus with hyperglycemia: Secondary | ICD-10-CM | POA: Diagnosis present

## 2015-09-19 DIAGNOSIS — D61810 Antineoplastic chemotherapy induced pancytopenia: Secondary | ICD-10-CM | POA: Diagnosis present

## 2015-09-19 DIAGNOSIS — J9601 Acute respiratory failure with hypoxia: Secondary | ICD-10-CM | POA: Insufficient documentation

## 2015-09-19 DIAGNOSIS — Z8673 Personal history of transient ischemic attack (TIA), and cerebral infarction without residual deficits: Secondary | ICD-10-CM | POA: Diagnosis not present

## 2015-09-19 DIAGNOSIS — K829 Disease of gallbladder, unspecified: Secondary | ICD-10-CM

## 2015-09-19 DIAGNOSIS — Z09 Encounter for follow-up examination after completed treatment for conditions other than malignant neoplasm: Secondary | ICD-10-CM

## 2015-09-19 DIAGNOSIS — E1122 Type 2 diabetes mellitus with diabetic chronic kidney disease: Secondary | ICD-10-CM | POA: Diagnosis present

## 2015-09-19 DIAGNOSIS — Z7982 Long term (current) use of aspirin: Secondary | ICD-10-CM

## 2015-09-19 DIAGNOSIS — Z5189 Encounter for other specified aftercare: Secondary | ICD-10-CM | POA: Diagnosis not present

## 2015-09-19 DIAGNOSIS — Z515 Encounter for palliative care: Secondary | ICD-10-CM | POA: Insufficient documentation

## 2015-09-19 DIAGNOSIS — R4182 Altered mental status, unspecified: Secondary | ICD-10-CM | POA: Diagnosis not present

## 2015-09-19 DIAGNOSIS — A4189 Other specified sepsis: Secondary | ICD-10-CM | POA: Diagnosis not present

## 2015-09-19 DIAGNOSIS — I129 Hypertensive chronic kidney disease with stage 1 through stage 4 chronic kidney disease, or unspecified chronic kidney disease: Secondary | ICD-10-CM | POA: Diagnosis present

## 2015-09-19 DIAGNOSIS — E883 Tumor lysis syndrome: Secondary | ICD-10-CM | POA: Diagnosis present

## 2015-09-19 DIAGNOSIS — K567 Ileus, unspecified: Secondary | ICD-10-CM | POA: Diagnosis present

## 2015-09-19 DIAGNOSIS — Z7189 Other specified counseling: Secondary | ICD-10-CM | POA: Insufficient documentation

## 2015-09-19 DIAGNOSIS — N170 Acute kidney failure with tubular necrosis: Secondary | ICD-10-CM | POA: Diagnosis not present

## 2015-09-19 DIAGNOSIS — K4090 Unilateral inguinal hernia, without obstruction or gangrene, not specified as recurrent: Secondary | ICD-10-CM | POA: Diagnosis present

## 2015-09-19 DIAGNOSIS — G9341 Metabolic encephalopathy: Secondary | ICD-10-CM | POA: Diagnosis not present

## 2015-09-19 DIAGNOSIS — I252 Old myocardial infarction: Secondary | ICD-10-CM | POA: Diagnosis not present

## 2015-09-19 DIAGNOSIS — A419 Sepsis, unspecified organism: Secondary | ICD-10-CM | POA: Diagnosis not present

## 2015-09-19 DIAGNOSIS — C8593 Non-Hodgkin lymphoma, unspecified, intra-abdominal lymph nodes: Secondary | ICD-10-CM

## 2015-09-19 DIAGNOSIS — Z66 Do not resuscitate: Secondary | ICD-10-CM | POA: Diagnosis present

## 2015-09-19 DIAGNOSIS — K7200 Acute and subacute hepatic failure without coma: Secondary | ICD-10-CM | POA: Diagnosis not present

## 2015-09-19 DIAGNOSIS — D701 Agranulocytosis secondary to cancer chemotherapy: Secondary | ICD-10-CM | POA: Diagnosis not present

## 2015-09-19 DIAGNOSIS — C864 Blastic NK-cell lymphoma: Secondary | ICD-10-CM | POA: Diagnosis not present

## 2015-09-19 DIAGNOSIS — Z6828 Body mass index (BMI) 28.0-28.9, adult: Secondary | ICD-10-CM

## 2015-09-19 DIAGNOSIS — K1230 Oral mucositis (ulcerative), unspecified: Secondary | ICD-10-CM | POA: Diagnosis not present

## 2015-09-19 DIAGNOSIS — T451X5A Adverse effect of antineoplastic and immunosuppressive drugs, initial encounter: Secondary | ICD-10-CM | POA: Diagnosis present

## 2015-09-19 DIAGNOSIS — L8990 Pressure ulcer of unspecified site, unspecified stage: Secondary | ICD-10-CM | POA: Insufficient documentation

## 2015-09-19 DIAGNOSIS — E872 Acidosis: Secondary | ICD-10-CM | POA: Diagnosis present

## 2015-09-19 DIAGNOSIS — R0902 Hypoxemia: Secondary | ICD-10-CM

## 2015-09-19 DIAGNOSIS — Z833 Family history of diabetes mellitus: Secondary | ICD-10-CM

## 2015-09-19 DIAGNOSIS — D509 Iron deficiency anemia, unspecified: Secondary | ICD-10-CM | POA: Diagnosis not present

## 2015-09-19 DIAGNOSIS — J9690 Respiratory failure, unspecified, unspecified whether with hypoxia or hypercapnia: Secondary | ICD-10-CM

## 2015-09-19 DIAGNOSIS — C8590 Non-Hodgkin lymphoma, unspecified, unspecified site: Secondary | ICD-10-CM

## 2015-09-19 DIAGNOSIS — C8599 Non-Hodgkin lymphoma, unspecified, extranodal and solid organ sites: Secondary | ICD-10-CM

## 2015-09-19 DIAGNOSIS — C8330 Diffuse large B-cell lymphoma, unspecified site: Secondary | ICD-10-CM

## 2015-09-19 DIAGNOSIS — L89159 Pressure ulcer of sacral region, unspecified stage: Secondary | ICD-10-CM | POA: Diagnosis present

## 2015-09-19 DIAGNOSIS — D709 Neutropenia, unspecified: Secondary | ICD-10-CM | POA: Diagnosis not present

## 2015-09-19 DIAGNOSIS — B965 Pseudomonas (aeruginosa) (mallei) (pseudomallei) as the cause of diseases classified elsewhere: Secondary | ICD-10-CM | POA: Diagnosis present

## 2015-09-19 DIAGNOSIS — R319 Hematuria, unspecified: Secondary | ICD-10-CM | POA: Diagnosis present

## 2015-09-19 DIAGNOSIS — R410 Disorientation, unspecified: Secondary | ICD-10-CM

## 2015-09-19 DIAGNOSIS — N19 Unspecified kidney failure: Secondary | ICD-10-CM | POA: Diagnosis not present

## 2015-09-19 DIAGNOSIS — E440 Moderate protein-calorie malnutrition: Secondary | ICD-10-CM | POA: Diagnosis not present

## 2015-09-19 DIAGNOSIS — R1900 Intra-abdominal and pelvic swelling, mass and lump, unspecified site: Secondary | ICD-10-CM | POA: Diagnosis not present

## 2015-09-19 DIAGNOSIS — D6489 Other specified anemias: Secondary | ICD-10-CM | POA: Diagnosis not present

## 2015-09-19 DIAGNOSIS — R21 Rash and other nonspecific skin eruption: Secondary | ICD-10-CM | POA: Diagnosis not present

## 2015-09-19 LAB — URINALYSIS, ROUTINE W REFLEX MICROSCOPIC
Bilirubin Urine: NEGATIVE
Glucose, UA: NEGATIVE mg/dL
KETONES UR: NEGATIVE mg/dL
NITRITE: NEGATIVE
PH: 5.5 (ref 5.0–8.0)
PROTEIN: 100 mg/dL — AB
Specific Gravity, Urine: 1.014 (ref 1.005–1.030)
Urobilinogen, UA: 0.2 mg/dL (ref 0.0–1.0)

## 2015-09-19 LAB — LACTIC ACID, PLASMA
Lactic Acid, Venous: 1.7 mmol/L (ref 0.5–2.0)
Lactic Acid, Venous: 2.2 mmol/L (ref 0.5–2.0)

## 2015-09-19 LAB — GLUCOSE, CAPILLARY
GLUCOSE-CAPILLARY: 150 mg/dL — AB (ref 65–99)
GLUCOSE-CAPILLARY: 181 mg/dL — AB (ref 65–99)

## 2015-09-19 LAB — MRSA PCR SCREENING: MRSA by PCR: NEGATIVE

## 2015-09-19 LAB — URINE MICROSCOPIC-ADD ON

## 2015-09-19 MED ORDER — POLYETHYLENE GLYCOL 3350 17 G PO PACK
17.0000 g | PACK | Freq: Every day | ORAL | Status: DC | PRN
  Administered 2015-09-27: 17 g via ORAL
  Filled 2015-09-19: qty 1

## 2015-09-19 MED ORDER — SODIUM CHLORIDE 0.9 % IV SOLN
Freq: Once | INTRAVENOUS | Status: AC
  Administered 2015-09-19: 10:00:00 via INTRAVENOUS

## 2015-09-19 MED ORDER — ALLOPURINOL 300 MG PO TABS: 300.0000 mg | ORAL_TABLET | Freq: Every day | ORAL | Status: DC

## 2015-09-19 MED ORDER — ASPIRIN 81 MG PO CHEW
81.0000 mg | CHEWABLE_TABLET | Freq: Every day | ORAL | Status: DC
  Administered 2015-09-20 – 2015-09-29 (×10): 81 mg via ORAL
  Filled 2015-09-19 (×10): qty 1

## 2015-09-19 MED ORDER — SODIUM CHLORIDE 0.9 % IV BOLUS (SEPSIS)
250.0000 mL | Freq: Once | INTRAVENOUS | Status: AC
  Administered 2015-09-19: 250 mL via INTRAVENOUS

## 2015-09-19 MED ORDER — ACETAMINOPHEN 325 MG PO TABS
650.0000 mg | ORAL_TABLET | Freq: Four times a day (QID) | ORAL | Status: DC | PRN
  Administered 2015-09-21 – 2015-09-29 (×4): 650 mg via ORAL
  Filled 2015-09-19 (×5): qty 2

## 2015-09-19 MED ORDER — ONDANSETRON HCL 4 MG/2ML IJ SOLN: 4.0000 mg | Freq: Four times a day (QID) | INTRAMUSCULAR | Status: DC | PRN

## 2015-09-19 MED ORDER — ONDANSETRON HCL 4 MG PO TABS: 4.0000 mg | ORAL_TABLET | Freq: Four times a day (QID) | ORAL | Status: DC | PRN

## 2015-09-19 MED ORDER — SODIUM CHLORIDE 0.9 % IV SOLN
INTRAVENOUS | Status: DC
  Administered 2015-09-19: 100 mL/h via INTRAVENOUS
  Administered 2015-09-20 – 2015-09-21 (×2): via INTRAVENOUS

## 2015-09-19 MED ORDER — OXYCODONE HCL 5 MG PO TABS
5.0000 mg | ORAL_TABLET | ORAL | Status: DC | PRN
  Administered 2015-09-20 – 2015-09-28 (×9): 5 mg via ORAL
  Filled 2015-09-19 (×10): qty 1

## 2015-09-19 MED ORDER — ENOXAPARIN SODIUM 30 MG/0.3ML ~~LOC~~ SOLN
30.0000 mg | SUBCUTANEOUS | Status: DC
  Administered 2015-09-19 – 2015-09-30 (×10): 30 mg via SUBCUTANEOUS
  Filled 2015-09-19 (×9): qty 0.3

## 2015-09-19 MED ORDER — ATORVASTATIN CALCIUM 10 MG PO TABS
10.0000 mg | ORAL_TABLET | Freq: Every day | ORAL | Status: DC
  Administered 2015-09-20 – 2015-09-21 (×2): 10 mg via ORAL
  Filled 2015-09-19 (×2): qty 1

## 2015-09-19 MED ORDER — ACETAMINOPHEN 650 MG RE SUPP
650.0000 mg | Freq: Four times a day (QID) | RECTAL | Status: DC | PRN
  Administered 2015-09-29: 650 mg via RECTAL
  Filled 2015-09-19 (×2): qty 1

## 2015-09-19 MED ORDER — PANTOPRAZOLE SODIUM 40 MG PO TBEC
40.0000 mg | DELAYED_RELEASE_TABLET | Freq: Every day | ORAL | Status: DC
  Administered 2015-09-19 – 2015-09-26 (×8): 40 mg via ORAL
  Filled 2015-09-19 (×11): qty 1

## 2015-09-19 NOTE — Progress Notes (Addendum)
[place] OFFICE PROGRESS NOTE   Diagnosis:   Non-Hodgkin's lymphoma  INTERVAL HISTORY:   Mr. Carlos Black is a 64 year old gentleman recently diagnosed with large B-cell lymphoma, CD20 positive, involving a large retroperitoneal mass, adrenal masses and a left testicular mass. On 09/18/2015 and underwent a left radical orchiectomy and placement of a left ureteral stent.  He is scheduled to begin chemotherapy 09/21/2015.   Upon arrival to the office he was noted to be weak and somewhat lethargic. Initial vital signs showed temperature 100.3, heart rate 115, blood pressure 107/62, oxygen saturation 84% on room air. Oxygen saturation improved to 94% with 3 L of oxygen.  He denies fever, shaking chills. No nausea or vomiting. He had loose stools yesterday. He denies any bleeding. He denies pain. He denies shortness of breath and chest pain.   Objective:  Vital signs in last 24 hours:  Blood pressure 103/63, pulse 113, temperature 100.3 F (37.9 C), temperature source Oral, resp. rate 24, SpO2 94 %.    HEENT: Mouth appears dry. Resp: Lungs clear anteriorly. Cardio: Regular, tachycardic. GI: Abdomen distended. No mass palpated on exam today. Vascular: No leg edema. Neuro: Lethargic. Follows commands. Confused. Left-sided weakness.  Skin: Incision at the left low abdomen intact. Ecchymosis mid to lower abdomen. GU: Indwelling Foley catheter.    Lab Results:  Lab Results  Component Value Date   WBC 7.6 09/18/2015   HGB 9.3* 09/18/2015   HCT 29.4* 09/18/2015   MCV 79.5 09/18/2015   PLT 237 09/18/2015   NEUTROABS 2.4 09/07/2015    Imaging:  Dg Chest 2 View  09/18/2015  CLINICAL DATA:  Preop orchiectomy EXAM: CHEST  2 VIEW COMPARISON:  CT chest dated 09/07/2015 FINDINGS: Small layering left pleural effusion. Mild right basilar atelectasis. No pneumothorax. The heart is top-normal in size. IMPRESSION: Small layering left pleural effusion. Electronically Signed    By: Julian Hy M.D.   On: 09/18/2015 12:24   Dg C-arm 1-60 Min-no Report  09/18/2015  CLINICAL DATA: lt orchiectomy C-ARM 1-60 MINUTES Fluoroscopy was utilized by the requesting physician.  No radiographic interpretation.    Medications: I have reviewed the patient's current medications.  Assessment/Plan: 1. Left retroperitoneal mass, adrenal masses, left testicular mass, status post a CT-guided biopsy of the left retroperitoneal mass 09/08/2015-pathology confirmed large B-cell lymphoma, CD20 positive 2. Renal failure, left hydronephrosis; left ureter stent placed 09/18/2015. 3. Left hemiplegia-chronic following a CVA 4. Microcytic anemia 5. Fever-resolved on Decadron, Decadron discontinued 09/13/2015 6. Rash/pruritus 09/14/2015-? Contact dermatitis versus drug rash 7. Status post left orchiectomy and placement of a left ureter stent 09/18/2015.    Disposition: Mr. Champoux was recently diagnosed with a large B-cell lymphoma, CD20 positive. He underwent a left orchiectomy and placement of a left ureter stent 09/18/2015. He presented to the office today for chemotherapy education and a follow-up visit. He appears lethargic, weak.  The current presentation may be due to a combination of dehydration, recent surgery, progressive lymphoma. Dr. Benay Spice recommends hospitalization for further evaluation. He is scheduled for a staging PET scan 09/20/2015. Lymphoma treatment may be initiated while he is in the hospital pending the PET scan result. He will be admitted to the hospitalist service.  Patient seen with Dr. Benay Spice. 30 minutes were spent face-to-face at today's visit with the majority of that time involved in counseling/coordination of care.  Ned Card ANP/GNP-BC   08/30/2015  10:11 AM  This was a shared visit with Ned Card. Mr. Benko was interviewed and examined. He  appears lethargic and dehydrated today. He is not a candidate for outpatient chemotherapy in his  current condition. He will be admitted for supportive care measures, a staging PET scan, and initiation of systemic therapy for lymphoma.   Julieanne Manson, M.D.

## 2015-09-19 NOTE — Progress Notes (Signed)
Report received from A. Setzer, RN. I agree with previous RN's assessment. Will continue to monitor pt closely. Asaro, Carlos Black I 

## 2015-09-19 NOTE — Progress Notes (Signed)
0950-O2 @ 2L [place] placed on pt.  Sat increased to 91%

## 2015-09-19 NOTE — H&P (Signed)
[facility] History and Physical  Carlos Black LGX:[id number] DOB: September 12, 1951 DOA: 08/31/2015  Referring physician: Dr.Sherril PCP: [name], MD   Chief Complaint: AKI  HPI: Carlos Black is a 64 y.o. male with past medical history of CVA with residual left-sided weakness, diabetes type 2, hypertension, CAD, CKD3, was just discharged from [facility] long hospital on 10/20, he was admitted for worsening weakness then and through his workup he was found to have fevers and large retroperitoneal mass with left-sided hydronephrosis, and a left testicular mass. He underwent a CT-guided biopsy on 10/24 and the biopsy was positive for high-grade B-cell lymphoma, he was also seen by urology during the hospitalization and was scheduled for orchiectomy and stent placement post discharge. He had fevers throughout his hospitalization, he was on broad-spectrum antibiotic as workup for infectious source was negative and hence felt to have tumor fever related to his lymphoma. After was discharged to SNF on Friday he was seen at day surgery yesterday and underwent a radical left orchiectomy, placement of left ureteral stent, cystoscopy and retrograde pyelogram by Dr.:Ottelin. After this procedure he was discharged back to the nursing facility yesterday evening, he was brought to the [facility] this morning for chemotherapy teaching, was seen in the clinic by Dr. Ammie Dalton and he was felt to require admission for optimization, worsening renal function, low-grade fevers. Patient denies any complaints, he reports being at baseline,  his chronic left-sided weakness according to him is unchanged. Labs at the cancer centers to US show stable hemoglobin, creatinine went up from 2 to 3.3 today.   Review of Systems: Positives bolded Constitutional:  No weight loss, night sweats, Fevers, chills, fatigue.  HEENT:  No headaches, Difficulty swallowing,Tooth/dental problems,Sore throat,   No sneezing, itching, ear ache, nasal congestion, post nasal drip,  Cardio-vascular:  No chest pain, Orthopnea, PND, swelling in lower extremities, anasarca, dizziness, palpitations  GI:  No heartburn, indigestion, abdominal pain, nausea, vomiting, diarrhea, change in bowel habits, loss of appetite  Resp:  No shortness of breath with exertion or at rest. No excess mucus, no productive cough, No non-productive cough, No coughing up of blood.No change in color of mucus.No wheezing.No chest wall deformity  Skin:  no rash or lesions.  GU:  no dysuria, change in color of urine, no urgency or frequency. No flank pain.  Musculoskeletal:  No joint pain or swelling. No decreased range of motion. No back pain.  Psych:  No change in mood or affect. No depression or anxiety. No memory loss.   Past Medical History  Diagnosis Date  . Hypertension   . Coronary artery disease   . Myocardial infarction ([place])   . CAD (coronary artery disease) 09/05/2015  . SIRS (systemic inflammatory response syndrome) ([place]) 09/06/2015  . Acute renal failure (ARF) ([place]) 09/06/2015  . Type II diabetes mellitus ([place])   . Stroke [facility]) 2004    w/left sided weakness/notes 09/05/2015   Past Surgical History  Procedure Laterality Date  . Basilar arterty stent  07/25/2004     Dr. Abel Presto Deveshwar/notes 02/01/2011  . Orchiectomy Left 09/18/2015    Procedure: LEFT RADICAL ORCHIECTOMY;  Surgeon: Kathie Rhodes, MD;  Location: [facility] OR[facility];  Service: Urology;  Laterality: Left;  . Cystoscopy w/ ureteral stent placement Left 09/18/2015    Procedure: CYSTOSCOPY WITH LEFT URETERAL DILATION, LEFT RETROGRADE PYELOGRAM/URETERAL STENT PLACEMENT;  Surgeon: Kathie Rhodes, MD;  Location: [facility] OR[facility];  Service: Urology;  Laterality: Left;   Social History:  reports that he has never smoked.  He has never used smokeless tobacco. He reports that he does not drink alcohol or use illicit drugs.  No Known Allergies  Family History  Problem Relation Age  of Onset  . Diabetes Mellitus II Mother   . Pneumonia Father   . Diabetes Father     Prior to Admission medications   Medication Sig Start Date End Date Taking? Authorizing Provider  allopurinol (ZYLOPRIM) 300 MG tablet Take 1 tablet (300 mg total) by mouth daily. Start 09/20/15 for 10 days Patient not taking: Reported on 09/18/2015 09/18/15   Ladell Pier, MD  amLODipine (NORVASC) 10 MG tablet Take 1 tablet (10 mg total) by mouth daily. 12/25/11 09/18/15  Lavone Orn, MD  aspirin 81 MG chewable tablet Chew 1 tablet (81 mg total) by mouth daily. 09/14/15   Domenic Polite, MD  atorvastatin (LIPITOR) 10 MG tablet Take 1 tablet (10 mg total) by mouth daily. 12/25/11 09/18/15  Lavone Orn, MD  ondansetron (ZOFRAN) 4 MG tablet Take 1 tablet (4 mg total) by mouth every 6 (six) hours as needed for nausea. 09/14/15   Domenic Polite, MD  oxyCODONE (OXY IR/ROXICODONE) 5 MG immediate release tablet Take 1 tablet (5 mg total) by mouth every 4 (four) hours as needed for moderate pain. DEA# [id number] 09/18/15   Christell Faith, MD  pantoprazole (PROTONIX) 40 MG tablet Take 1 tablet (40 mg total) by mouth at bedtime. 09/14/15   Domenic Polite, MD   Physical Exam: Filed Vitals:   09/01/2015 1216 09/10/2015 1220 09/18/2015 1327  BP: 115/69    Pulse: 95    Temp: 99.7 F (37.6 C)    TempSrc: Oral    Resp: 24    Height:   5\' 5"  (1.651 m)  Weight:   76.686 kg (169 lb 1 oz)  SpO2: 97% 95%     Wt Readings from Last 3 Encounters:  09/17/2015 76.686 kg (169 lb 1 oz)  09/18/15 74.645 kg (164 lb 9 oz)  09/14/15 78.5 kg (173 lb 1 oz)    General:  Chronically ill-appearing but calm, comfortable, alert awake oriented to self place and partially to time , no distress  Eyes: PERRL, normal lids, irises & conjunctiva ENT: grossly normal hearing, lips & tongue Neck: no LAD, masses or thyromegaly Cardiovascular: RRR, no m/r/g. trace edema. Telemetry: SR, no arrhythmias  Respiratory: CTA bilaterally, no w/r/r.  Normal respiratory effort. Abdomen: soft, mildly distended, nontender, positive bowel sounds no organomegaly Skin: no rash or induration seen on limited exam Musculoskeletal: grossly normal tone BUE/BLE Psychiatric: grossly normal mood and affect, speech fluent and appropriate Neurologic: Residual left-sided weakness left upper extremity is chronically contracted ,           Labs on Admission:  Basic Metabolic Panel:  Recent Labs Lab 09/13/15 0600 09/14/15 0900 09/18/15 1050  NA 140 139 136  K 4.2 3.9 4.5  CL 110 112* 103  CO2 19* 19* 23  GLUCOSE 205* 112* 184*  BUN 49* 45* 29*  CREATININE 2.15* 1.99* 3.35*  CALCIUM 7.6* 8.5* 8.5*   Liver Function Tests: No results for input(s): AST, ALT, ALKPHOS, BILITOT, PROT, ALBUMIN in the last 168 hours. No results for input(s): LIPASE, AMYLASE in the last 168 hours. No results for input(s): AMMONIA in the last 168 hours. CBC:  Recent Labs Lab 09/13/15 0600 09/18/15 1050  WBC 9.2 7.6  HGB 8.7* 9.3*  HCT 27.2* 29.4*  MCV 78.6 79.5  PLT 265 237   Cardiac Enzymes: No results for input(s):  CKTOTAL, CKMB, CKMBINDEX, TROPONINI in the last 168 hours.  BNP (last 3 results) No results for input(s): BNP in the last 8760 hours.  ProBNP (last 3 results) No results for input(s): PROBNP in the last 8760 hours.  CBG:  Recent Labs Lab 09/13/15 2215 09/14/15 0733 09/14/15 1213 09/18/15 1051 09/18/15 1514  GLUCAP 289* 89 221* 169* 159*    Radiological Exams on Admission: Dg Chest 2 View  09/18/2015  CLINICAL DATA:  Preop orchiectomy EXAM: CHEST  2 VIEW COMPARISON:  CT chest dated 09/07/2015 FINDINGS: Small layering left pleural effusion. Mild right basilar atelectasis. No pneumothorax. The heart is top-normal in size. IMPRESSION: Small layering left pleural effusion. Electronically Signed   By: Julian Hy M.D.   On: 09/18/2015 12:24   Dg C-arm 1-60 Min-no Report  09/18/2015  CLINICAL DATA: lt orchiectomy C-ARM 1-60  MINUTES Fluoroscopy was utilized by the requesting physician.  No radiographic interpretation.   Assessment/Plan    AKI (acute kidney injury)' -suspect pre-renal, has high metabolic demands from Aggressive Lymphoma -baseline creatinine at discharge was around 2 -hydrate-NS at 100cc/hr, has foley -monitor bmet, had L ureteral stent placed for Hydronephrosis -if creatinine does not improve will get Repeat Renal US    High Grade B cell/Aggressive Lymphoma -with high metabolci demands, fevers  -hydrate, Dr.Sherril will FU and plans to start Chemo soon   Mild Hypoxia -check CXR, on 2L O2 now -asymptomatic, no distress    H/O: CVA with residual L sided weakness -continue ASA    Pressure ulcer -wound care    Fever -likely due to Lymphoma alone, infectious workup negative and ran fevers throughout last admission -check CXR and UA, hold off on Abx yet    HTN -hold amlodipine, BP soft but stable  Code Status: Full Code DVT Prophylaxis:Lovenox -30mg  Family Communication: none at bedside Disposition Plan: inpatient   Time spent: 94min  Dajsha Massaro [facility] Pager [phone]

## 2015-09-20 ENCOUNTER — Inpatient Hospital Stay (HOSPITAL_COMMUNITY): Payer: Medicare HMO

## 2015-09-20 ENCOUNTER — Inpatient Hospital Stay: Payer: Medicare Other | Admitting: Nurse Practitioner

## 2015-09-20 DIAGNOSIS — R509 Fever, unspecified: Secondary | ICD-10-CM

## 2015-09-20 DIAGNOSIS — R531 Weakness: Secondary | ICD-10-CM

## 2015-09-20 DIAGNOSIS — C8590 Non-Hodgkin lymphoma, unspecified, unspecified site: Secondary | ICD-10-CM

## 2015-09-20 DIAGNOSIS — D638 Anemia in other chronic diseases classified elsewhere: Secondary | ICD-10-CM

## 2015-09-20 DIAGNOSIS — N179 Acute kidney failure, unspecified: Secondary | ICD-10-CM

## 2015-09-20 DIAGNOSIS — R21 Rash and other nonspecific skin eruption: Secondary | ICD-10-CM

## 2015-09-20 DIAGNOSIS — D631 Anemia in chronic kidney disease: Secondary | ICD-10-CM

## 2015-09-20 DIAGNOSIS — R1900 Intra-abdominal and pelvic swelling, mass and lump, unspecified site: Secondary | ICD-10-CM

## 2015-09-20 LAB — COMPREHENSIVE METABOLIC PANEL
ALBUMIN: 1.9 g/dL — AB (ref 3.5–5.0)
ALK PHOS: 114 U/L (ref 38–126)
ALT: 14 U/L — ABNORMAL LOW (ref 17–63)
ANION GAP: 10 (ref 5–15)
AST: 37 U/L (ref 15–41)
BUN: 30 mg/dL — ABNORMAL HIGH (ref 6–20)
CO2: 20 mmol/L — AB (ref 22–32)
Calcium: 7.6 mg/dL — ABNORMAL LOW (ref 8.9–10.3)
Chloride: 106 mmol/L (ref 101–111)
Creatinine, Ser: 4.8 mg/dL — ABNORMAL HIGH (ref 0.61–1.24)
GFR calc Af Amer: 13 mL/min — ABNORMAL LOW (ref >60)
GFR calc non Af Amer: 11 mL/min — ABNORMAL LOW (ref >60)
GLUCOSE: 157 mg/dL — AB (ref 65–99)
POTASSIUM: 4 mmol/L (ref 3.5–5.1)
SODIUM: 136 mmol/L (ref 135–145)
Total Bilirubin: 0.8 mg/dL (ref 0.3–1.2)
Total Protein: 5.4 g/dL — ABNORMAL LOW (ref 6.5–8.1)

## 2015-09-20 LAB — LACTATE DEHYDROGENASE: LDH: 342 U/L — ABNORMAL HIGH (ref 98–192)

## 2015-09-20 LAB — CBC
HCT: 25.9 % — ABNORMAL LOW (ref 39.0–52.0)
HEMOGLOBIN: 8.2 g/dL — AB (ref 13.0–17.0)
MCH: 25.4 pg — ABNORMAL LOW (ref 26.0–34.0)
MCHC: 31.7 g/dL (ref 30.0–36.0)
MCV: 80.2 fL (ref 78.0–100.0)
Platelets: 252 10*3/uL (ref 150–400)
RBC: 3.23 MIL/uL — ABNORMAL LOW (ref 4.22–5.81)
RDW: 18 % — AB (ref 11.5–15.5)
WBC: 9.4 10*3/uL (ref 4.0–10.5)

## 2015-09-20 LAB — GLUCOSE, CAPILLARY
GLUCOSE-CAPILLARY: 134 mg/dL — AB (ref 65–99)
GLUCOSE-CAPILLARY: 166 mg/dL — AB (ref 65–99)
Glucose-Capillary: 153 mg/dL — ABNORMAL HIGH (ref 65–99)

## 2015-09-20 LAB — URIC ACID: Uric Acid, Serum: 9.3 mg/dL — ABNORMAL HIGH (ref 4.4–7.6)

## 2015-09-20 MED ORDER — FLUDEOXYGLUCOSE F - 18 (FDG) INJECTION
8.0000 | Freq: Once | INTRAVENOUS | Status: DC | PRN
  Administered 2015-09-20: 8 via INTRAVENOUS
  Filled 2015-09-20: qty 8

## 2015-09-20 MED ORDER — SODIUM CHLORIDE 0.9 % IV BOLUS (SEPSIS)
1000.0000 mL | Freq: Once | INTRAVENOUS | Status: AC
  Administered 2015-09-20: 1000 mL via INTRAVENOUS

## 2015-09-20 MED ORDER — SODIUM CHLORIDE 0.9 % IV BOLUS (SEPSIS): 500.0000 mL | Freq: Once | INTRAVENOUS | Status: DC

## 2015-09-20 NOTE — Consult Note (Signed)
WOC wound consult note Reason for Consult:DTPI to sacrum and Stage 1 to left heel Wound type: Pressure Pressure Ulcer POA: Yes Measurement:Left heel:  4cm round area of non-blanching erythema.  No warmth, no induration. Sacral DTPI: 3cm x 8.5cm area of maroon/purple discoloration with boggy center measuring 1cm round.  No open area at this time.  Expect to see epidermal sloughing and perhaps progression to a full thickness area of tissue loss, consistent with classic presentation of this classification of tissue injury. Wound bed:As described above. Drainage (amount, consistency, odor)  Periwound: Dressing procedure/placement/frequency: A therapeutic sleep surface is provided with low air loss feature.  Bilateral pressure redistribution heel boots are provided.  Guidance is provided for the Nursing staff for turning and repositioning as well as topical wound care. [facility] nursing team will not follow closely, but will remain available to this patient, the nursing and medical teams.  Please re-consult if needed. Thanks, Maudie Flakes, MSN, RN, [place], [place], [place] [phone])

## 2015-09-20 NOTE — Progress Notes (Signed)
IP PROGRESS NOTE  Subjective:   No complaint. He reports the left arm and leg weakness are at baseline.  Objective: Vital signs in last 24 hours: Blood pressure 126/71, pulse 97, temperature 98.4 F (36.9 C), temperature source Oral, resp. rate 20, height 5\' 5"  (1.651 m), weight 169 lb 1 oz (76.686 kg), SpO2 96 %.  Intake/Output from previous day: 10/25 0701 - 10/26 0700 In: 1700 [I.V.:1700] Out: 350 [Urine:350]  Physical Exam:  HEENT: No thrush Lungs: Clear anteriorly Cardiac: Regular rate and rhythm Abdomen: No hepatomegaly, no mass Extremities: No leg edema Neurologic: Alert, follows commands, 3/5 strength of the left arm and hand, minimal movement of the left leg and foot Skin: Healing incision at the left groin, ecchymoses at the low abdominal wall    Lab Results:  Recent Labs  09/18/15 1050 09/20/15 0640  WBC 7.6 9.4  HGB 9.3* 8.2*  HCT 29.4* 25.9*  PLT 237 252    BMET  Recent Labs  09/18/15 1050 09/20/15 0640  NA 136 136  K 4.5 4.0  CL 103 106  CO2 23 20*  GLUCOSE 184* 157*  BUN 29* 30*  CREATININE 3.35* 4.80*  CALCIUM 8.5* 7.6*    Studies/Results: Dg Chest 2 View  09/15/2015  CLINICAL DATA:  Weakness and hypoxia EXAM: CHEST  2 VIEW COMPARISON:  Chest x-rays dated 09/18/2015 and 09/06/2015. FINDINGS: Cardiomediastinal silhouette remains normal in size and configuration, heart size is upper normal. Small layering left pleural effusion appears stable. Elevation of the right hemidiaphragm is unchanged, with mild overlying atelectasis. No new lung findings seen. No acute osseous abnormality. IMPRESSION: Stable chest x-ray.  Stable small layering left pleural effusion. Electronically Signed   By: Franki Cabot M.D.   On: 09/02/2015 14:55   Dg Chest 2 View  09/18/2015  CLINICAL DATA:  Preop orchiectomy EXAM: CHEST  2 VIEW COMPARISON:  CT chest dated 09/07/2015 FINDINGS: Small layering left pleural effusion. Mild right basilar atelectasis. No  pneumothorax. The heart is top-normal in size. IMPRESSION: Small layering left pleural effusion. Electronically Signed   By: Julian Hy M.D.   On: 09/18/2015 12:24   Dg C-arm 1-60 Min-no Report  09/18/2015  CLINICAL DATA: lt orchiectomy C-ARM 1-60 MINUTES Fluoroscopy was utilized by the requesting physician.  No radiographic interpretation.    Medications: I have reviewed the patient's current medications.  Assessment/Plan:  1. Left retroperitoneal mass, adrenal masses, left testicular mass, status post a CT-guided biopsy of the left retroperitoneal mass 09/08/2015-pathology confirmed large B-cell lymphoma, CD20 positive 2. Renal failure, left hydronephrosis; left ureter stent placed 09/18/2015. Progressive. 3. Left hemiplegia-chronic following a CVA 4. Microcytic anemia secondary to chronic disease, renal failure, surgery, and multiple phlebotomies 5. Fever-resolved on Decadron, Decadron discontinued 09/13/2015 6. Rash/pruritus 09/14/2015-? Contact dermatitis versus drug rash 7. Status post left orchiectomy and placement of a left ureter stent 09/18/2015. Pathology pending from the orchiectomy.   He is more alert today. He is scheduled to undergo a staging PET scan today. We will follow-up on the pathology from the left orchiectomy 09/18/2015. The plan is to begin systemic therapy for lymphoma during this hospital admission. He has developed progressive renal failure. This will need to be addressed and optimized prior to beginning systemic therapy.  Recommendations: 1. Follow-up PET result today 2. Consider renal ultrasound and nephrology consult 3. Chemotherapy teaching and systemic therapy to begin within the next few days pending renal function   LOS: 1 day   [place]  09/20/2015, 10:15 AM

## 2015-09-20 NOTE — Progress Notes (Signed)
PT Cancellation Note  Patient Details Name: Marcel Gary MRN: [id number] DOB: [birth year]   Cancelled Treatment:    Reason Eval/Treat Not Completed: Patient at procedure or test/unavailable Pt sleeping all morning and currently out of room for procedure.  Will check back as schedule permits.   Isak Sotomayor,KATHrine E 09/20/2015, 2:56 PM Carmelia Bake, PT, DPT 09/20/2015 Pager: [phone]

## 2015-09-20 NOTE — Progress Notes (Signed)
Patient transferred from [place]. Patient is alert. Oriented to time and place. Slow to respond. Denies pain. On an overlay mattress. Foley inplace. No changes fro the report given by [facility] nurse.Will continue to monitor.

## 2015-09-20 NOTE — NC FL2 (Signed)
[place] LEVEL OF CARE SCREENING TOOL     IDENTIFICATION  Patient Name: Carlos Black Birthdate: 07/05/1951 Sex: male Admission Date (Current Location): 09/04/2015  [facility] and [place] Number: Herbalist and Address:  [facility],  [address] [place], [place]      Provider Number: [id number]  Attending Physician Name and Address:  Charlynne Cousins, MD  Relative Name and Phone Number:       Current Level of Care: Hospital Recommended Level of Care: [place] Prior Approval Number:    Date Approved/Denied:   PASRR Number:  [id number]   Discharge Plan: SNF    Current Diagnoses: Patient Active Problem List   Diagnosis Date Noted  . Pressure ulcer 09/17/2015  . AKI (acute kidney injury) ([place]) 09/08/2015  . Fever 09/12/2015  . Hypoxia 09/11/2015  . 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 Malignant lymphoma, large B-cell, diffuse ([place]) 09/13/2015  . Back pain   . Pelvic mass in male   . Weakness 09/05/2015  . SIRS (systemic inflammatory response syndrome) ([place]) 09/05/2015  . Hypotension 09/05/2015  . Hyperkalemia 09/05/2015  . ARF (acute renal failure) ([place]) 09/05/2015  . Dehydration 09/05/2015  . Left sided numbness 09/05/2015  . H/O: CVA (cerebrovascular accident) 09/05/2015  . CAD (coronary artery disease) 09/05/2015  . Oral thrush 09/05/2015  . Dysphagia 09/05/2015  . Anemia 09/05/2015  . Hyponatremia 09/05/2015  . Weakness of left side of body   . Hypertension 12/21/2011  . Headache(784.0) 12/21/2011  . Diabetes mellitus ([place]) 12/21/2011  . Hyperglycemia 12/21/2011  . Leukocytosis 12/21/2011    Orientation ACTIVITIES/SOCIAL BLADDER RESPIRATION  Self (oriented x4) Active Continent O2 (As needed)  BEHAVIORAL SYMPTOMS/MOOD NEUROLOGICAL BOWEL NUTRITION STATUS      Continent Diet (heart healthy)  PHYSICIAN VISITS COMMUNICATION OF NEEDS Height & Weight Skin    Verbally 5\' 5"  (165.1  cm) 169 lbs. Other (Comment)          AMBULATORY STATUS RESPIRATION    Assist extensive O2 (As needed)      Personal Care Assistance Level of Assistance  Bathing, Dressing Bathing Assistance: Limited assistance   Dressing Assistance: Limited assistance      Functional Limitations Info                [place]  PT (By licensed PT)     PT Frequency:  (5)             Additional Factors Info   (FULL CODE NKDA)               Current Medications (09/20/2015): Current Facility-Administered Medications  Medication Dose Route Frequency Provider Last Rate Last Dose  . 0.9 %  sodium chloride infusion   Intravenous Continuous Domenic Polite, MD 100 mL/hr at 09/20/15 0042    . acetaminophen (TYLENOL) tablet 650 mg  650 mg Oral Q6H PRN Domenic Polite, MD       Or  . acetaminophen (TYLENOL) suppository 650 mg  650 mg Rectal Q6H PRN Domenic Polite, MD      . allopurinol (ZYLOPRIM) tablet 300 mg  300 mg Oral Daily Domenic Polite, MD      . aspirin chewable tablet 81 mg  81 mg Oral Daily Domenic Polite, MD      . atorvastatin (LIPITOR) tablet 10 mg  10 mg Oral Daily Domenic Polite, MD      . enoxaparin (LOVENOX) injection 30 mg  30 mg Subcutaneous Q24H Domenic Polite,  MD   30 mg at 09/23/2015 1707  . ondansetron (ZOFRAN) tablet 4 mg  4 mg Oral Q6H PRN Domenic Polite, MD       Or  . ondansetron [facility]) injection 4 mg  4 mg Intravenous Q6H PRN Domenic Polite, MD      . oxyCODONE (Oxy IR/ROXICODONE) immediate release tablet 5 mg  5 mg Oral Q4H PRN Domenic Polite, MD      . pantoprazole (PROTONIX) EC tablet 40 mg  40 mg Oral QHS Domenic Polite, MD   40 mg at 09/13/2015 2141  . polyethylene glycol (MIRALAX / GLYCOLAX) packet 17 g  17 g Oral Daily PRN Domenic Polite, MD       Do not use this list as official medication orders. Please verify with discharge summary.  Discharge Medications:   Medication List    ASK your doctor about these medications         allopurinol 300 MG tablet  Commonly known as:  ZYLOPRIM  Take 300 mg by mouth daily. Start 09/20/15 for 10 days due to elevated uric acid     amLODipine 10 MG tablet  Commonly known as:  NORVASC  Take 1 tablet (10 mg total) by mouth daily.     aspirin 81 MG chewable tablet  Chew 1 tablet (81 mg total) by mouth daily.     atorvastatin 10 MG tablet  Commonly known as:  LIPITOR  Take 1 tablet (10 mg total) by mouth daily.     ondansetron 4 MG tablet  Commonly known as:  ZOFRAN  Take 1 tablet (4 mg total) by mouth every 6 (six) hours as needed for nausea.     oxyCODONE 5 MG immediate release tablet  Commonly known as:  Oxy IR/ROXICODONE  Take 1 tablet (5 mg total) by mouth every 4 (four) hours as needed for moderate pain. DEA# [phone]     pantoprazole 40 MG tablet  Commonly known as:  PROTONIX  Take 1 tablet (40 mg total) by mouth at bedtime.        Relevant Imaging Results:  Relevant Lab Results:  Recent Labs    Additional Information    Ludwig Clarks, LCSW

## 2015-09-20 NOTE — Progress Notes (Addendum)
[facility] PROGRESS NOTE    Progress Note   Roi Jafari DVV:[id number] DOB: 1951/01/17 DOA: 09/15/2015 PCP: [name], MD   Brief Narrative:   Carlos Black is an 64 y.o. male with a history of CVA and left residual weakness, diabetes mellitus type 2, chronic kidney disease stage III recently diagnosed with high-grade B-cell lymphoma which he has not started chemotherapy with ongoing fevers since last admission came back to the oncology office for chemotherapy teaching was found to have fever with worsening renal function  Assessment/Plan:   AKI (acute kidney injury) ([place]) - Baseline creatinine 1.9-2.3, on admission 3.3 and this morning is 4.8. - Blood pressure has been stable and agree with IV fluid hydration. Lites not order on admission - Check a renal ultrasound to look for hydronephrosis, urethral stent dysfunction.  High-grade B-cell aggressive lymphoma: Fever likely due to high-grade lymphoma due to hypermetabolic demand Continue aggressive IV fluid hydration CT of the head to rule out CNS lymphoma.  Slow to response and mildly somnolent: I am not sure this is baseline as he has had a previous stroke. He does have high-grade lymphoma which makes me concerned about CNS involvement. Check a CT of the head.  Mild hypoxia: Mildly hypoxic on admission he's been greater than 90% on 2 L, ? due high metabolic demand. Will try to get him off nasal cannula.  History of CVA (cerebrovascular accident) Continue aspirin.  Sacral decubitus pressure Ulcer: Consult wound care.  Fever: Likely due to lymphoma, infectious workup has been negative. He has remained afebrile since admission. Chest x-ray and UA are negative.    DVT Prophylaxis - Lovenox ordered.  Family Communication: none Disposition Plan: Home when stable. Code Status:     Code Status Orders        Start     Ordered   08/29/2015 1440  Full code   Continuous     09/22/2015 1439         IV Access:    Peripheral IV   Procedures and diagnostic studies:   Dg Chest 2 View  09/18/2015  CLINICAL DATA:  Weakness and hypoxia EXAM: CHEST  2 VIEW COMPARISON:  Chest x-rays dated 09/18/2015 and 09/06/2015. FINDINGS: Cardiomediastinal silhouette remains normal in size and configuration, heart size is upper normal. Small layering left pleural effusion appears stable. Elevation of the right hemidiaphragm is unchanged, with mild overlying atelectasis. No new lung findings seen. No acute osseous abnormality. IMPRESSION: Stable chest x-ray.  Stable small layering left pleural effusion. Electronically Signed   By: Franki Cabot M.D.   On: 08/28/2015 14:55   Dg Chest 2 View  09/18/2015  CLINICAL DATA:  Preop orchiectomy EXAM: CHEST  2 VIEW COMPARISON:  CT chest dated 09/07/2015 FINDINGS: Small layering left pleural effusion. Mild right basilar atelectasis. No pneumothorax. The heart is top-normal in size. IMPRESSION: Small layering left pleural effusion. Electronically Signed   By: Julian Hy M.D.   On: 09/18/2015 12:24   Dg C-arm 1-60 Min-no Report  09/18/2015  CLINICAL DATA: lt orchiectomy C-ARM 1-60 MINUTES Fluoroscopy was utilized by the requesting physician.  No radiographic interpretation.     Medical Consultants:    None.  Anti-Infectives:   Anti-infectives    None      Subjective:    [name] patient is slow to answer he has no new complaints.  Objective:    Filed Vitals:   09/18/2015 1220 09/22/2015 1327 09/08/2015 2142 09/20/15 0520  BP:   126/62 126/71  Pulse:  93 97  Temp:   98.6 F (37 C) 98.4 F (36.9 C)  TempSrc:   Oral Oral  Resp:   22 20  Height:  5\' 5"  (1.651 m)    Weight:  76.686 kg (169 lb 1 oz)    SpO2: 95%  96% 96%    Intake/Output Summary (Last 24 hours) at 09/20/15 1050 Last data filed at 09/20/15 0700  Gross per 24 hour  Intake   1700 ml  Output    350 ml  Net   1350 ml   Filed Weights   09/17/2015 1327  Weight:  76.686 kg (169 lb 1 oz)    Exam: Gen:  NAD, but slow to answer he is awake alert and oriented 2 with no complaints Cardiovascular:  RRR, No M/R/G Chest and lungs:   CTAB Abdomen:  Abdomen soft, NT/ND, + BS Extremities:  No C/E/C   Data Reviewed:    Labs: Basic Metabolic Panel:  Recent Labs Lab 09/14/15 0900 09/18/15 1050 09/20/15 0640  NA 139 136 136  K 3.9 4.5 4.0  CL 112* 103 106  CO2 19* 23 20*  GLUCOSE 112* 184* 157*  BUN 45* 29* 30*  CREATININE 1.99* 3.35* 4.80*  CALCIUM 8.5* 8.5* 7.6*   GFR Estimated Creatinine Clearance: 14.5 mL/min (by C-G formula based on Cr of 4.8). Liver Function Tests:  Recent Labs Lab 09/20/15 0640  AST 37  ALT 14*  ALKPHOS 114  BILITOT 0.8  PROT 5.4*  ALBUMIN 1.9*   No results for input(s): LIPASE, AMYLASE in the last 168 hours. No results for input(s): AMMONIA in the last 168 hours. Coagulation profile No results for input(s): INR, PROTIME in the last 168 hours.  CBC:  Recent Labs Lab 09/18/15 1050 09/20/15 0640  WBC 7.6 9.4  HGB 9.3* 8.2*  HCT 29.4* 25.9*  MCV 79.5 80.2  PLT 237 252   Cardiac Enzymes: No results for input(s): CKTOTAL, CKMB, CKMBINDEX, TROPONINI in the last 168 hours. BNP (last 3 results) No results for input(s): PROBNP in the last 8760 hours. CBG:  Recent Labs Lab 09/18/15 1051 09/18/15 1514 09/22/2015 1510 08/29/2015 1626 09/20/15 0736  GLUCAP 169* 159* 150* 181* 153*   D-Dimer: No results for input(s): DDIMER in the last 72 hours. Hgb A1c: No results for input(s): HGBA1C in the last 72 hours. Lipid Profile: No results for input(s): CHOL, HDL, LDLCALC, TRIG, CHOLHDL, LDLDIRECT in the last 72 hours. Thyroid function studies: No results for input(s): TSH, T4TOTAL, T3FREE, THYROIDAB in the last 72 hours.  Invalid input(s): FREET3 Anemia work up: No results for input(s): VITAMINB12, FOLATE, FERRITIN, TIBC, IRON, RETICCTPCT in the last 72 hours. Sepsis Labs:  Recent Labs Lab  09/18/15 1050 09/20/2015 1419 09/16/2015 1825 09/20/15 0640  WBC 7.6  --   --  9.4  LATICACIDVEN  --  1.7 2.2*  --    Microbiology Recent Results (from the past 240 hour(s))  C difficile quick scan w PCR reflex     Status: None   Collection Time: 09/13/15  4:00 AM  Result Value Ref Range Status   C Diff antigen NEGATIVE NEGATIVE Final   C Diff toxin NEGATIVE NEGATIVE Final   C Diff interpretation Negative for toxigenic C. difficile  Final  MRSA PCR Screening     Status: None   Collection Time: 09/15/2015 12:26 PM  Result Value Ref Range Status   MRSA by PCR NEGATIVE NEGATIVE Final    Comment:        The  GeneXpert MRSA Assay (FDA approved for NASAL specimens only), is one component of a comprehensive MRSA colonization surveillance program. It is not intended to diagnose MRSA infection nor to guide or monitor treatment for MRSA infections.      Medications:   . aspirin  81 mg Oral Daily  . atorvastatin  10 mg Oral Daily  . enoxaparin (LOVENOX) injection  30 mg Subcutaneous Q24H  . pantoprazole  40 mg Oral QHS  . sodium chloride  1,000 mL Intravenous Once   Continuous Infusions: . sodium chloride 100 mL/hr at 09/20/15 0042    Time spent: 25 min   LOS: 1 day   Charlynne Cousins  [facility] Pager [phone]  *Please refer to [email or web address], password [email or web address] to get updated schedule on who will round on this patient, as hospitalists switch teams weekly. If 7PM-7AM, please contact night-coverage at [email or web address], password [email or web address] for any overnight needs.  09/20/2015, 10:50 AM

## 2015-09-20 NOTE — Progress Notes (Signed)
Patient admitted from [facility]- unable to engage him this morning- very drowsy and only opens his eyes and closes them. CSW has completed FL2 and awaiting MD signature-  Attempting to reach wife by phone for assessment to be completed.   Eduard Clos, MSW, [place]

## 2015-09-21 ENCOUNTER — Inpatient Hospital Stay: Payer: Medicare Other | Admitting: Nurse Practitioner

## 2015-09-21 ENCOUNTER — Ambulatory Visit: Payer: Medicare Other

## 2015-09-21 DIAGNOSIS — R0902 Hypoxemia: Secondary | ICD-10-CM

## 2015-09-21 DIAGNOSIS — L8990 Pressure ulcer of unspecified site, unspecified stage: Secondary | ICD-10-CM

## 2015-09-21 LAB — BASIC METABOLIC PANEL
ANION GAP: 9 (ref 5–15)
BUN: 30 mg/dL — ABNORMAL HIGH (ref 6–20)
CALCIUM: 7.6 mg/dL — AB (ref 8.9–10.3)
CO2: 18 mmol/L — ABNORMAL LOW (ref 22–32)
CREATININE: 5.35 mg/dL — AB (ref 0.61–1.24)
Chloride: 110 mmol/L (ref 101–111)
GFR, EST AFRICAN AMERICAN: 12 mL/min — AB (ref >60)
GFR, EST NON AFRICAN AMERICAN: 10 mL/min — AB (ref >60)
GLUCOSE: 158 mg/dL — AB (ref 65–99)
Potassium: 4 mmol/L (ref 3.5–5.1)
Sodium: 137 mmol/L (ref 135–145)

## 2015-09-21 MED ORDER — GLUCERNA SHAKE PO LIQD
237.0000 mL | Freq: Three times a day (TID) | ORAL | Status: DC
  Administered 2015-09-21 – 2015-09-27 (×9): 237 mL via ORAL
  Filled 2015-09-21 (×19): qty 237

## 2015-09-21 MED ORDER — SODIUM CHLORIDE 0.9 % IV BOLUS (SEPSIS)
250.0000 mL | Freq: Once | INTRAVENOUS | Status: AC
  Administered 2015-09-21: 250 mL via INTRAVENOUS

## 2015-09-21 NOTE — Progress Notes (Signed)
Initial Nutrition Assessment  DOCUMENTATION CODES:   Non-severe (moderate) malnutrition in context of chronic illness  INTERVENTION:   Provide Glucerna Shake po TID, each supplement provides 220 kcal and 10 grams of protein RD to continue to monitor  NUTRITION DIAGNOSIS:   Increased nutrient needs related to wound healing as evidenced by estimated needs.  GOAL:   Patient will meet greater than or equal to 90% of their needs  MONITOR:   PO intake, Supplement acceptance, Labs, Weight trends, Skin, I & O's  REASON FOR ASSESSMENT:   Low [place]    ASSESSMENT:   63 y.o. male with a history of CVA and left residual weakness, diabetes mellitus type 2, chronic kidney disease stage III recently diagnosed with high-grade B-cell lymphoma which he has not started chemotherapy with ongoing fevers since last admission came back to the oncology office for chemotherapy teaching was found to have fever with worsening renal function  Patient eating well, 100% of heart healthy diet. Pt was recently in hospital last week (last seen by an RD 10/19). Patient with many wounds that increase his protein needs, will order Glucerna shakes TID.  Weight remains stable.   Patient with mild fat and muscle depletion.  Labs reviewed: Elevated BUN, Creatinine CBGs: 134-166  Diet Order:  Diet Heart Room service appropriate?: Yes; Fluid consistency:: Thin  Skin:   Open groin wound, deep tissue injury- coccyx and sacrum, stage I heel ulcer  Last BM:  10/19  Height:   Ht Readings from Last 1 Encounters:  09/05/2015 5\' 5"  (1.651 m)    Weight:   Wt Readings from Last 1 Encounters:  09/14/2015 169 lb 1 oz (76.686 kg)    Ideal Body Weight:  61.8 kg  BMI:  Body mass index is 28.13 kg/(m^2).  Estimated Nutritional Needs:   Kcal:  5974-1638  Protein:  80-90g  Fluid:  2L/day  EDUCATION NEEDS:   No education needs identified at this time  Clayton Bibles, MS, RD, LDN Pager: [phone] After  Hours Pager: [phone]

## 2015-09-21 NOTE — Consult Note (Signed)
[facility] Renal Consultation Note  Requesting MD: Feliz-Ortiz Indication for Consultation: A on CRF  HPI:  Carlos Black is a 64 y.o. male with past medical history significant for diabetes mellitus, hypertension, coronary artery disease as well as a history of a CVA with residual left-sided weakness. He also seemed to have an element of CKD dating back to 2013 where creatinine was in the 1.2-1.5 range. He was noted to present to the [facility] emergency department on October 11 with left sided weakness.  He was also noted to have a creatinine of 3.4 in the setting of hypotension and being on an ARB. His ARB was held and kidney function improved slowly to a nadir of 1.99 on October 20. During this time also imaging revealed a large left retroperitoneal mass with lymphadenopathy. This mass was thought to be obstructing the left ureter and causing left-sided hydronephrosis. However, since creatinine was trending down it was decided to get a biopsy of this mass before the hydronephrosis was dealt with. Biopsy revealed large cell B lymphoma. Then, on October 24,patient underwent dilation of a urethral stricture with a double J stent placed in the left ureter. Creatinine was noted to be 3.35 at this time. Unfortunately, kidney function has continued to worsen. Imaging done on October 26 reveals no hydronephrosis but does indicate possible lymphoma involvement of the left kidney.   patient was actually discharged after his urologic procedure, came back as an outpatient to oncology and required admission. He is continuing to have fevers.  Chemotherapy has not been started due to these fevers and worsening renal function. "Renal function needs to be managed and optimized before he begins on chemotherapy" .   He is making some urine 750 mL out the last 24 hours.  His blood pressure has been on little low around 100 upon admission but is been okay lately. Urinalysis only mostly reveals hematuria. I  do not identify any nephrotoxins given to him.  Patient appears very debilitated today. He is minimally responsive to my questions. He knows that he is at [facility] but claims that he did not know that he had lymphoma. The chart indicates that he came in on October 11 he was independent at home prior to that admission but after admission was discharged to nursing home. He is requiring help with everything even taking Tylenol here.    CREATININE, SER  Date/Time Value Ref Range Status  09/21/2015 09:40 AM 5.35* 0.61 - 1.24 mg/dL Final  09/20/2015 06:40 AM 4.80* 0.61 - 1.24 mg/dL Final  09/18/2015 10:50 AM 3.35* 0.61 - 1.24 mg/dL Final  09/14/2015 09:00 AM 1.99* 0.61 - 1.24 mg/dL Final  09/13/2015 06:00 AM 2.15* 0.61 - 1.24 mg/dL Final  09/12/2015 02:42 AM 2.35* 0.61 - 1.24 mg/dL Final  09/11/2015 02:58 AM 2.52* 0.61 - 1.24 mg/dL Final  09/10/2015 04:27 AM 2.93* 0.61 - 1.24 mg/dL Final  09/09/2015 03:53 AM 3.14* 0.61 - 1.24 mg/dL Final  09/08/2015 04:04 AM 3.19* 0.61 - 1.24 mg/dL Final  09/07/2015 09:17 AM 3.01* 0.61 - 1.24 mg/dL Final  09/06/2015 02:42 AM 3.14* 0.61 - 1.24 mg/dL Final  09/05/2015 08:43 PM 3.24* 0.61 - 1.24 mg/dL Final  09/05/2015 03:00 PM 3.44* 0.61 - 1.24 mg/dL Final  12/25/2011 05:39 AM 1.27 0.50 - 1.35 mg/dL Final  12/24/2011 05:39 AM 1.43* 0.50 - 1.35 mg/dL Final  12/23/2011 06:00 AM 1.72* 0.50 - 1.35 mg/dL Final    Comment:    DELTA CHECK NOTED  12/22/2011 04:20 AM  1.14 0.50 - 1.35 mg/dL Final  12/22/2011 01:51 AM 1.15 0.50 - 1.35 mg/dL Final  12/21/2011 07:13 PM 1.22 0.50 - 1.35 mg/dL Final     PMHx:   Past Medical History  Diagnosis Date  . Hypertension   . Coronary artery disease   . Myocardial infarction ([place])   . CAD (coronary artery disease) 09/05/2015  . SIRS (systemic inflammatory response syndrome) ([place]) 09/06/2015  . Acute renal failure (ARF) ([place]) 09/06/2015  . Type II diabetes mellitus ([place])   . Stroke [facility]) 2004    w/left sided  weakness/notes 09/05/2015    Past Surgical History  Procedure Laterality Date  . Basilar arterty stent  07/25/2004     Dr. Abel Presto Deveshwar/notes 02/01/2011  . Orchiectomy Left 09/18/2015    Procedure: LEFT RADICAL ORCHIECTOMY;  Surgeon: Kathie Rhodes, MD;  Location: [facility];  Service: Urology;  Laterality: Left;  . Cystoscopy w/ ureteral stent placement Left 09/18/2015    Procedure: CYSTOSCOPY WITH LEFT URETERAL DILATION, LEFT RETROGRADE PYELOGRAM/URETERAL STENT PLACEMENT;  Surgeon: Kathie Rhodes, MD;  Location: [facility];  Service: Urology;  Laterality: Left;    Family Hx:  Family History  Problem Relation Age of Onset  . Diabetes Mellitus II Mother   . Pneumonia Father   . Diabetes Father     Social History:  reports that he has never smoked. He has never used smokeless tobacco. He reports that he does not drink alcohol or use illicit drugs.  Allergies: No Known Allergies  Medications: Prior to Admission medications   Medication Sig Start Date End Date Taking? Authorizing Provider  amLODipine (NORVASC) 10 MG tablet Take 1 tablet (10 mg total) by mouth daily. 12/25/11 08/30/2015 Yes Lavone Orn, MD  aspirin 81 MG chewable tablet Chew 1 tablet (81 mg total) by mouth daily. 09/14/15  Yes Domenic Polite, MD  atorvastatin (LIPITOR) 10 MG tablet Take 1 tablet (10 mg total) by mouth daily. 12/25/11 09/10/2015 Yes Lavone Orn, MD  ondansetron (ZOFRAN) 4 MG tablet Take 1 tablet (4 mg total) by mouth every 6 (six) hours as needed for nausea. 09/14/15  Yes Domenic Polite, MD  oxyCODONE (OXY IR/ROXICODONE) 5 MG immediate release tablet Take 1 tablet (5 mg total) by mouth every 4 (four) hours as needed for moderate pain. DEA# [id number] 09/18/15  Yes Christell Faith, MD  pantoprazole (PROTONIX) 40 MG tablet Take 1 tablet (40 mg total) by mouth at bedtime. 09/14/15  Yes Domenic Polite, MD  allopurinol (ZYLOPRIM) 300 MG tablet Take 300 mg by mouth daily. Start 09/20/15 for 10 days due to elevated uric  acid    Historical Provider, MD    I have reviewed the patient's current medications.  Labs:  Results for orders placed or performed during the hospital encounter of 09/24/2015 (from the past 48 hour(s))  Lactic acid, plasma     Status: None   Collection Time: 09/18/2015  2:19 PM  Result Value Ref Range   Lactic Acid, Venous 1.7 0.5 - 2.0 mmol/L  Glucose, capillary     Status: Abnormal   Collection Time: 09/08/2015  3:10 PM  Result Value Ref Range   Glucose-Capillary 150 (H) 65 - 99 mg/dL  Urinalysis, Routine w reflex microscopic (not at [facility])     Status: Abnormal   Collection Time: 09/14/2015  3:45 PM  Result Value Ref Range   Color, Urine RED (A) YELLOW    Comment: BIOCHEMICALS MAY BE AFFECTED BY COLOR   APPearance TURBID (A) CLEAR   Specific Gravity, Urine  1.014 1.005 - 1.030   pH 5.5 5.0 - 8.0   Glucose, UA NEGATIVE NEGATIVE mg/dL   Hgb urine dipstick LARGE (A) NEGATIVE   Bilirubin Urine NEGATIVE NEGATIVE   Ketones, ur NEGATIVE NEGATIVE mg/dL   Protein, ur 100 (A) NEGATIVE mg/dL   Urobilinogen, UA 0.2 0.0 - 1.0 mg/dL   Nitrite NEGATIVE NEGATIVE   Leukocytes, UA MODERATE (A) NEGATIVE  Urine microscopic-add on     Status: None   Collection Time: 08/26/2015  3:45 PM  Result Value Ref Range   Squamous Epithelial / LPF RARE RARE   WBC, UA 21-50 <3 WBC/hpf   RBC / HPF TOO NUMEROUS TO COUNT <3 RBC/hpf  Glucose, capillary     Status: Abnormal   Collection Time: 09/14/2015  4:26 PM  Result Value Ref Range   Glucose-Capillary 181 (H) 65 - 99 mg/dL  Lactic acid, plasma     Status: Abnormal   Collection Time: 09/04/2015  6:25 PM  Result Value Ref Range   Lactic Acid, Venous 2.2 (HH) 0.5 - 2.0 mmol/L    Comment: CRITICAL RESULT CALLED TO, READ BACK BY AND VERIFIED WITH: GRACE SISON,RN 09/06/2015 @ 1913 BY J SCOTTON   CBC     Status: Abnormal   Collection Time: 09/20/15  6:40 AM  Result Value Ref Range   WBC 9.4 4.0 - 10.5 K/uL   RBC 3.23 (L) 4.22 - 5.81 MIL/uL   Hemoglobin 8.2 (L) 13.0 -  17.0 g/dL   HCT 25.9 (L) 39.0 - 52.0 %   MCV 80.2 78.0 - 100.0 fL   MCH 25.4 (L) 26.0 - 34.0 pg   MCHC 31.7 30.0 - 36.0 g/dL   RDW 18.0 (H) 11.5 - 15.5 %   Platelets 252 150 - 400 K/uL  Comprehensive metabolic panel     Status: Abnormal   Collection Time: 09/20/15  6:40 AM  Result Value Ref Range   Sodium 136 135 - 145 mmol/L   Potassium 4.0 3.5 - 5.1 mmol/L   Chloride 106 101 - 111 mmol/L   CO2 20 (L) 22 - 32 mmol/L   Glucose, Bld 157 (H) 65 - 99 mg/dL   BUN 30 (H) 6 - 20 mg/dL   Creatinine, Ser 4.80 (H) 0.61 - 1.24 mg/dL   Calcium 7.6 (L) 8.9 - 10.3 mg/dL   Total Protein 5.4 (L) 6.5 - 8.1 g/dL   Albumin 1.9 (L) 3.5 - 5.0 g/dL   AST 37 15 - 41 U/L   ALT 14 (L) 17 - 63 U/L   Alkaline Phosphatase 114 38 - 126 U/L   Total Bilirubin 0.8 0.3 - 1.2 mg/dL   GFR calc non Af Amer 11 (L) >60 mL/min   GFR calc Af Amer 13 (L) >60 mL/min    Comment: (NOTE) The eGFR has been calculated using the CKD EPI equation. This calculation has not been validated in all clinical situations. eGFR's persistently <60 mL/min signify possible Chronic Kidney Disease.    Anion gap 10 5 - 15  Lactate dehydrogenase     Status: Abnormal   Collection Time: 09/20/15  6:40 AM  Result Value Ref Range   LDH 342 (H) 98 - 192 U/L  Uric acid     Status: Abnormal   Collection Time: 09/20/15  6:40 AM  Result Value Ref Range   Uric Acid, Serum 9.3 (H) 4.4 - 7.6 mg/dL  Glucose, capillary     Status: Abnormal   Collection Time: 09/20/15  7:36 AM  Result Value  Ref Range   Glucose-Capillary 153 (H) 65 - 99 mg/dL  Glucose, capillary     Status: Abnormal   Collection Time: 09/20/15 12:16 PM  Result Value Ref Range   Glucose-Capillary 134 (H) 65 - 99 mg/dL  Glucose, capillary     Status: Abnormal   Collection Time: 09/20/15  6:32 PM  Result Value Ref Range   Glucose-Capillary 166 (H) 65 - 99 mg/dL   Comment 1 Notify RN    Comment 2 Document in Chart   Basic metabolic panel     Status: Abnormal   Collection Time:  09/21/15  9:40 AM  Result Value Ref Range   Sodium 137 135 - 145 mmol/L   Potassium 4.0 3.5 - 5.1 mmol/L   Chloride 110 101 - 111 mmol/L   CO2 18 (L) 22 - 32 mmol/L   Glucose, Bld 158 (H) 65 - 99 mg/dL   BUN 30 (H) 6 - 20 mg/dL   Creatinine, Ser 5.35 (H) 0.61 - 1.24 mg/dL   Calcium 7.6 (L) 8.9 - 10.3 mg/dL   GFR calc non Af Amer 10 (L) >60 mL/min   GFR calc Af Amer 12 (L) >60 mL/min    Comment: (NOTE) The eGFR has been calculated using the CKD EPI equation. This calculation has not been validated in all clinical situations. eGFR's persistently <60 mL/min signify possible Chronic Kidney Disease.    Anion gap 9 5 - 15     ROS:  Really simplistic because he is only giving one-word answers. He denies pain   Physical Exam: Filed Vitals:   09/21/15 0640  BP:   Pulse:   Temp: 98.9 F (37.2 C)  Resp:      General: PALE, FALLING ASLEEP DURING OUR CONVERSATION. ONE-WORD ANSWERS  HEENT: pupils are equal round reactive to light, extraocular motions are intact, mucous membranes moist  Neck: no jugular venous distention  Heart: slightly tachycardic  Lungs: poor effort, decreased breath sounds at the bases  Abdomen: distended, tender to deep palpation positive bowel sounds  Extremities: 1+ pitting edema  Skin: warm and dry  Neuro: somnolent, arousable, one-word answers. [place] but told me that no one told him he had lymphoma or cancer   Assessment/Plan: 64 year old white male with some baseline CKD along with other chronic illnesses. Recent diagnosis of large B-cell lymphoma giving a mass effect to his ureter status post stenting but also likely lymphoma involvement of the left kidney. He now has acute on chronic renal failure which is worsening  1.Renal- acute on chronic renal failure which is worsening. Most recent imaging is negative for obstruction but positive for likely lymphoma involvement of the left kidney. He is nonoliguric. I cannot identify any recent  nephrotoxins. It is possible the patient was hypotensive on admission 3 days ago and has ATN that could plateau and improve. However, it is also possible that this lymphoma involvement of the kidneys will continue to cause worsening of acute on chronic renal failure. There are no absolute indications for dialysis at this time. However, the only way to optimize his renal function to make him more suitable for systemic chemotherapy if kidney function does not improve would be to initiate dialysis. I would not want to do this unless there was a full understanding by the family that if he did not clinically improve he may not be a long-term dialysis candidate.  This is a difficult situation 2. Hypertension/volume  - seems only slightly volume overloaded at this time. Is third  spacing secondary to hypoalbuminemia. Don't feel he needs any more fluid at this time. 3. Anemia  - likely secondary to his malignancy and his CKD. Management per oncology  4. B-cell lymphoma - likely causing most of his clinical presentation. I would need to have some idea regarding prognosis. As above, if renal function does not improve spontaneously the only way to optimize it  would be to initiate dialysis.  However , It also is possible that initiation of chemotherapy could improve his renal function so I'm not sure if that is an option.   Thank you for this consult. We will continue to follow with you    Karisma Meiser A 09/21/2015, 1:37 PM

## 2015-09-21 NOTE — Progress Notes (Signed)
[facility] PROGRESS NOTE    Progress Note   Carlos Black IRJ:[id number] DOB: 09/12/1951 DOA: 09/18/2015 PCP: [name], MD   Brief Narrative:   Carlos Black is an 64 y.o. male with a history of CVA and left residual weakness, diabetes mellitus type 2, chronic kidney disease stage III recently diagnosed with high-grade B-cell lymphoma which he has not started chemotherapy with ongoing fevers since last admission came back to the oncology office for chemotherapy teaching was found to have fever with worsening renal function  Assessment/Plan:   AKI (acute kidney injury) ([place]): - Baseline creatinine 1.9-2.3, on admission 3.3 and this morning is 4.8. - He is about 3 L positive, basic metabolic panel is pending. - Renal ultrasound showed no hydronephrosis and ill-defined hypoechoic mass in the left lower pole of the kidney likely lymphoma. \will need a PICC line  High-grade B-cell aggressive lymphoma: Fever likely due to high-grade lymphoma due to hypermetabolic demand Continue aggressive IV fluid hydration CT of the head no masses concerning for lymphoma.  Mild hypoxia: Mildly hypoxic on admission he's been greater than 90% on 2 L, ? due high metabolic demand. Will try to get him off nasal cannula.  History of CVA (cerebrovascular accident) Continue aspirin.  Sacral decubitus pressure Ulcer: Consult wound care.  Fever: Likely due to lymphoma, infectious workup has been negative. He has remained afebrile since admission. Chest x-ray and UA are negative.    DVT Prophylaxis - Lovenox ordered.  Family Communication: none Disposition Plan: Home when stable. Code Status:     Code Status Orders        Start     Ordered   09/22/2015 1440  Full code   Continuous     09/14/2015 1439        IV Access:    Peripheral IV   Procedures and diagnostic studies:   Dg Chest 2 View  09/18/2015  CLINICAL DATA:  Weakness and hypoxia EXAM: CHEST  2 VIEW  COMPARISON:  Chest x-rays dated 09/18/2015 and 09/06/2015. FINDINGS: Cardiomediastinal silhouette remains normal in size and configuration, heart size is upper normal. Small layering left pleural effusion appears stable. Elevation of the right hemidiaphragm is unchanged, with mild overlying atelectasis. No new lung findings seen. No acute osseous abnormality. IMPRESSION: Stable chest x-ray.  Stable small layering left pleural effusion. Electronically Signed   By: Franki Cabot M.D.   On: 09/04/2015 14:55   Ct Head Wo Contrast  09/20/2015  CLINICAL DATA:  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 Confusion. EXAM: CT HEAD WITHOUT CONTRAST TECHNIQUE: Contiguous axial images were obtained from the base of the skull through the vertex without contrast. COMPARISON:  MRI 09/06/2015 and head CT 09/05/2015 FINDINGS: No evidence for acute hemorrhage, mass lesion, midline shift, hydrocephalus or large infarct. Subtle low-density in the periventricular white matter appears unchanged. There is an old lacune infarct in the anterior limb of the right internal capsule. Visualized paranasal sinuses and mastoid air cells are clear. No acute bone abnormality. No suspicious bony lesions. IMPRESSION: No acute intracranial abnormality. Subtle white matter changes suggest chronic small vessel ischemic disease. Old lacune in the right internal capsule. Electronically Signed   By: Markus Daft M.D.   On: 09/20/2015 15:58   US Renal  09/20/2015  CLINICAL DATA:  Acute kidney injury. EXAM: RENAL / URINARY TRACT ULTRASOUND COMPLETE COMPARISON:  CT 09/07/2015, PET CT 05/21/2015 FINDINGS: Right Kidney: Length: 10.7 cm. Perinephric fluid is identified. No focal renal mass  or hydronephrosis identified sonographically. Left Kidney: Length: 11.5 cm. Renal parenchyma is heterogeneous. The lower pole of the kidney appears more hypoechoic and mildly enlarged compared with the remainder of the kidney,  consistent with lymphomatous involvement as identified on PET-CT. No hydronephrosis. Bladder: Foley catheter decompresses the bladder. IMPRESSION: 1. No hydronephrosis. 2. Ill-defined hypoechoic lower pole of the left kidney, consistent with lymphoma. Electronically Signed   By: Nolon Nations M.D.   On: 09/20/2015 16:33   Nm Pet Image Initial (pi) Skull Base To Thigh  09/20/2015  CLINICAL DATA:  Initial treatment strategy for high-grade B-cell non-Hodgkin's lymphoma. EXAM: NUCLEAR MEDICINE PET SKULL BASE TO THIGH TECHNIQUE: 8.0 mCi F-18 FDG was injected intravenously. Full-ring PET imaging was performed from the skull base to thigh after the radiotracer. CT data was obtained and used for attenuation correction and anatomic localization. FASTING BLOOD GLUCOSE:  Value: 125 mg/dl COMPARISON:  Multiple exams, including 09/07/2015 FINDINGS: NECK No hypermetabolic lymph nodes in the neck. CHEST Pleural-based right upper lobe mass posteriorly measures 2.6 by 1.2 cm on image 14 series 6, and has maximum standard uptake value 14.3. Right hilar lymph node measures approximately 2 cm in short axis and has maximum standard uptake value 19.3. Left infrahilar node is difficult to measure but has maximum standard uptake value of 6.4. Right lower lobe posterolateral pleural-based nodule measures INSERT flank and has maximum standard uptake value 5.3. Moderate size left pleural effusion has faintly increased activity on PET-CT. Coronary atherosclerosis. Small posterior paraspinal lymph node along the anterior margin of the left eleventh rib measures 0.8 cm in short axis on image 93 series 4 and has maximum standard uptake value 11.1. ABDOMEN/PELVIS Hypermetabolic bilateral adrenal masses with extensive abnormal hypermetabolic soft tissue masses within along the left perirenal fascia, involving the left kidney lower pole, and tracking in the retroperitoneum as a large mass encasing part of the abdominal aorta and proximal  iliac vasculature, incompletely encasing the left ureter. On image 137 series 4 this tumor measures 10.7 by 9.7 cm and may be invading the left psoas muscle. A double-J ureteral stent is in place. At the level of the loop of the double-J ureteral catheter, the dominant left retroperitoneal tumor has a maximum standard uptake value of 24.8. The left adrenal mass measures 5.1 by 4.1 cm on image 104 of series 4 and has a maximum standard uptake value of 25.7. The right adrenal mass measures 5.7 by 3.7 cm on image 105 series 4 and has a maximum standard uptake value of 18.5. Tumor tracking along the left pelvic sidewall has maximum standard uptake value of approximately 22.3. Oval-shaped soft tissue density along the left inguinal hernia measuring 3.0 by 2.0 cm on image 185 series 4 with maximum standard uptake value 23.6. I do not see a separate left testicle and I am suspicious that this may represent a retracted testicle with abnormally high metabolic activity unless the patient has had prior orchectomy. The right testicle seems to have normal activity level, although there is scrotal thickening as well as a right hydrocele within a right inguinal hernia. SKELETON Small focus of muscular activity along the gluteus medius muscle on the left, maximum standard uptake value 6.8. IMPRESSION: 1. Extensive tumor involvement in the abdomen including a very large left retroperitoneal mass encasing the ureter and involving the left kidney and perirenal space; bilateral adrenal malignancy; carry renal tumor deposits on the left ; and either a tumor nodule or more likely tumor involving the left testicle in the  left inguinal canal. In the chest, there are hypermetabolic pleural-based nodules as well as hypermetabolic adenopathy. Moderate size left pleural effusion has faintly increased activity on PET-CT and may be malignant. 2. Small focus of muscular activity in the left gluteus medius muscle. Probably malignant. 3. Coronary  atherosclerosis. Electronically Signed   By: Van Clines M.D.   On: 09/20/2015 16:06     Medical Consultants:    None.  Anti-Infectives:   Anti-infectives    None      Subjective:    Carlos Black he relates he feels about the same, not having any further pains. Anxious to start chemotherapy.  Objective:    Filed Vitals:   09/20/15 1655 09/20/15 2043 09/21/15 0454 09/21/15 0640  BP: 138/74 131/71 129/76   Pulse: 98 104 109   Temp: 99.7 F (37.6 C) 98 F (36.7 C) 101.1 F (38.4 C) 98.9 F (37.2 C)  TempSrc: Oral Oral Axillary Axillary  Resp: 20 20 20    Height:      Weight:      SpO2: 97% 97% 96%     Intake/Output Summary (Last 24 hours) at 09/21/15 5053 Last data filed at 09/21/15 0600  Gross per 24 hour  Intake 2256.67 ml  Output    750 ml  Net 1506.67 ml   Filed Weights   08/27/2015 1327  Weight: 76.686 kg (169 lb 1 oz)    Exam: Gen:  NAD,  Cardiovascular:  RRR, No M/R/G Chest and lungs:   CTAB Abdomen:  Abdomen soft, NT/ND, + BS Extremities:  No C/E/C   Data Reviewed:    Labs: Basic Metabolic Panel:  Recent Labs Lab 09/14/15 0900 09/18/15 1050 09/20/15 0640  NA 139 136 136  K 3.9 4.5 4.0  CL 112* 103 106  CO2 19* 23 20*  GLUCOSE 112* 184* 157*  BUN 45* 29* 30*  CREATININE 1.99* 3.35* 4.80*  CALCIUM 8.5* 8.5* 7.6*   GFR Estimated Creatinine Clearance: 14.5 mL/min (by C-G formula based on Cr of 4.8). Liver Function Tests:  Recent Labs Lab 09/20/15 0640  AST 37  ALT 14*  ALKPHOS 114  BILITOT 0.8  PROT 5.4*  ALBUMIN 1.9*   No results for input(s): LIPASE, AMYLASE in the last 168 hours. No results for input(s): AMMONIA in the last 168 hours. Coagulation profile No results for input(s): INR, PROTIME in the last 168 hours.  CBC:  Recent Labs Lab 09/18/15 1050 09/20/15 0640  WBC 7.6 9.4  HGB 9.3* 8.2*  HCT 29.4* 25.9*  MCV 79.5 80.2  PLT 237 252   Cardiac Enzymes: No results for input(s): CKTOTAL, CKMB,  CKMBINDEX, TROPONINI in the last 168 hours. BNP (last 3 results) No results for input(s): PROBNP in the last 8760 hours. CBG:  Recent Labs Lab 09/24/2015 1510 09/24/2015 1626 09/20/15 0736 09/20/15 1216 09/20/15 1832  GLUCAP 150* 181* 153* 134* 166*   D-Dimer: No results for input(s): DDIMER in the last 72 hours. Hgb A1c: No results for input(s): HGBA1C in the last 72 hours. Lipid Profile: No results for input(s): CHOL, HDL, LDLCALC, TRIG, CHOLHDL, LDLDIRECT in the last 72 hours. Thyroid function studies: No results for input(s): TSH, T4TOTAL, T3FREE, THYROIDAB in the last 72 hours.  Invalid input(s): FREET3 Anemia work up: No results for input(s): VITAMINB12, FOLATE, FERRITIN, TIBC, IRON, RETICCTPCT in the last 72 hours. Sepsis Labs:  Recent Labs Lab 09/18/15 1050 09/16/2015 1419 08/31/2015 1825 09/20/15 0640  WBC 7.6  --   --  9.4  LATICACIDVEN  --  1.7 2.2*  --    Microbiology Recent Results (from the past 240 hour(s))  C difficile quick scan w PCR reflex     Status: None   Collection Time: 09/13/15  4:00 AM  Result Value Ref Range Status   C Diff antigen NEGATIVE NEGATIVE Final   C Diff toxin NEGATIVE NEGATIVE Final   C Diff interpretation Negative for toxigenic C. difficile  Final  MRSA PCR Screening     Status: None   Collection Time: 09/13/2015 12:26 PM  Result Value Ref Range Status   MRSA by PCR NEGATIVE NEGATIVE Final    Comment:        The GeneXpert MRSA Assay (FDA approved for NASAL specimens only), is one component of a comprehensive MRSA colonization surveillance program. It is not intended to diagnose MRSA infection nor to guide or monitor treatment for MRSA infections.      Medications:   . aspirin  81 mg Oral Daily  . atorvastatin  10 mg Oral Daily  . enoxaparin (LOVENOX) injection  30 mg Subcutaneous Q24H  . pantoprazole  40 mg Oral QHS   Continuous Infusions: . sodium chloride 100 mL/hr at 09/21/15 0440    Time spent: 25 min   LOS:  2 days   Charlynne Cousins  [facility] Pager [phone]  *Please refer to [email or web address], password [email or web address] to get updated schedule on who will round on this patient, as hospitalists switch teams weekly. If 7PM-7AM, please contact night-coverage at [email or web address], password [email or web address] for any overnight needs.  09/21/2015, 8:22 AM

## 2015-09-21 NOTE — Progress Notes (Signed)
IP PROGRESS NOTE  Subjective:   No complaint. He reports the left arm and leg weakness are at baseline.  Objective: Vital signs in last 24 hours: Blood pressure 125/65, pulse 102, temperature 101.8 F (38.8 C), temperature source Axillary, resp. rate 28, height 5\' 5"  (1.651 m), weight 169 lb 1 oz (76.686 kg), SpO2 98 %.  Intake/Output from previous day: 10/26 0701 - 10/27 0700 In: 2256.7 [P.O.:240; I.V.:2016.7] Out: 750 [Urine:750]  Physical Exam:  HEENT: No thrush Lungs: Clear anteriorly Cardiac: Regular rate and rhythm Abdomen: No hepatomegaly, no mass Extremities: No leg edema Neurologic: Alert and oriented, follows commands, 3/5 strength of the left arm and hand, minimal movement of the left leg and foot Skin: Healing incision at the left groin, ecchymoses at the low abdominal wall    Lab Results:  Recent Labs  09/20/15 0640  WBC 9.4  HGB 8.2*  HCT 25.9*  PLT 252    BMET  Recent Labs  09/20/15 0640 09/21/15 0940  NA 136 137  K 4.0 4.0  CL 106 110  CO2 20* 18*  GLUCOSE 157* 158*  BUN 30* 30*  CREATININE 4.80* 5.35*  CALCIUM 7.6* 7.6*    Studies/Results: Ct Head Wo Contrast  09/20/2015  CLINICAL DATA:  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 Confusion. EXAM: CT HEAD WITHOUT CONTRAST TECHNIQUE: Contiguous axial images were obtained from the base of the skull through the vertex without contrast. COMPARISON:  MRI 09/06/2015 and head CT 09/05/2015 FINDINGS: No evidence for acute hemorrhage, mass lesion, midline shift, hydrocephalus or large infarct. Subtle low-density in the periventricular white matter appears unchanged. There is an old lacune infarct in the anterior limb of the right internal capsule. Visualized paranasal sinuses and mastoid air cells are clear. No acute bone abnormality. No suspicious bony lesions. IMPRESSION: No acute intracranial abnormality. Subtle white matter changes suggest chronic small  vessel ischemic disease. Old lacune in the right internal capsule. Electronically Signed   By: Markus Daft M.D.   On: 09/20/2015 15:58   US Renal  09/20/2015  CLINICAL DATA:  Acute kidney injury. EXAM: RENAL / URINARY TRACT ULTRASOUND COMPLETE COMPARISON:  CT 09/07/2015, PET CT 05/21/2015 FINDINGS: Right Kidney: Length: 10.7 cm. Perinephric fluid is identified. No focal renal mass or hydronephrosis identified sonographically. Left Kidney: Length: 11.5 cm. Renal parenchyma is heterogeneous. The lower pole of the kidney appears more hypoechoic and mildly enlarged compared with the remainder of the kidney, consistent with lymphomatous involvement as identified on PET-CT. No hydronephrosis. Bladder: Foley catheter decompresses the bladder. IMPRESSION: 1. No hydronephrosis. 2. Ill-defined hypoechoic lower pole of the left kidney, consistent with lymphoma. Electronically Signed   By: Nolon Nations M.D.   On: 09/20/2015 16:33   Nm Pet Image Initial (pi) Skull Base To Thigh  09/20/2015  CLINICAL DATA:  Initial treatment strategy for high-grade B-cell non-Hodgkin's lymphoma. EXAM: NUCLEAR MEDICINE PET SKULL BASE TO THIGH TECHNIQUE: 8.0 mCi F-18 FDG was injected intravenously. Full-ring PET imaging was performed from the skull base to thigh after the radiotracer. CT data was obtained and used for attenuation correction and anatomic localization. FASTING BLOOD GLUCOSE:  Value: 125 mg/dl COMPARISON:  Multiple exams, including 09/07/2015 FINDINGS: NECK No hypermetabolic lymph nodes in the neck. CHEST Pleural-based right upper lobe mass posteriorly measures 2.6 by 1.2 cm on image 14 series 6, and has maximum standard uptake value 14.3. Right hilar lymph node measures approximately 2 cm in short axis and has maximum standard uptake value  19.3. Left infrahilar node is difficult to measure but has maximum standard uptake value of 6.4. Right lower lobe posterolateral pleural-based nodule measures INSERT flank and has  maximum standard uptake value 5.3. Moderate size left pleural effusion has faintly increased activity on PET-CT. Coronary atherosclerosis. Small posterior paraspinal lymph node along the anterior margin of the left eleventh rib measures 0.8 cm in short axis on image 93 series 4 and has maximum standard uptake value 11.1. ABDOMEN/PELVIS Hypermetabolic bilateral adrenal masses with extensive abnormal hypermetabolic soft tissue masses within along the left perirenal fascia, involving the left kidney lower pole, and tracking in the retroperitoneum as a large mass encasing part of the abdominal aorta and proximal iliac vasculature, incompletely encasing the left ureter. On image 137 series 4 this tumor measures 10.7 by 9.7 cm and may be invading the left psoas muscle. A double-J ureteral stent is in place. At the level of the loop of the double-J ureteral catheter, the dominant left retroperitoneal tumor has a maximum standard uptake value of 24.8. The left adrenal mass measures 5.1 by 4.1 cm on image 104 of series 4 and has a maximum standard uptake value of 25.7. The right adrenal mass measures 5.7 by 3.7 cm on image 105 series 4 and has a maximum standard uptake value of 18.5. Tumor tracking along the left pelvic sidewall has maximum standard uptake value of approximately 22.3. Oval-shaped soft tissue density along the left inguinal hernia measuring 3.0 by 2.0 cm on image 185 series 4 with maximum standard uptake value 23.6. I do not see a separate left testicle and I am suspicious that this may represent a retracted testicle with abnormally high metabolic activity unless the patient has had prior orchectomy. The right testicle seems to have normal activity level, although there is scrotal thickening as well as a right hydrocele within a right inguinal hernia. SKELETON Small focus of muscular activity along the gluteus medius muscle on the left, maximum standard uptake value 6.8. IMPRESSION: 1. Extensive tumor  involvement in the abdomen including a very large left retroperitoneal mass encasing the ureter and involving the left kidney and perirenal space; bilateral adrenal malignancy; carry renal tumor deposits on the left ; and either a tumor nodule or more likely tumor involving the left testicle in the left inguinal canal. In the chest, there are hypermetabolic pleural-based nodules as well as hypermetabolic adenopathy. Moderate size left pleural effusion has faintly increased activity on PET-CT and may be malignant. 2. Small focus of muscular activity in the left gluteus medius muscle. Probably malignant. 3. Coronary atherosclerosis. Electronically Signed   By: Van Clines M.D.   On: 09/20/2015 16:06    Medications: I have reviewed the patient's current medications.  Assessment/Plan:  1. Left retroperitoneal mass, adrenal masses, left testicular mass, status post a CT-guided biopsy of the left retroperitoneal mass 09/08/2015-pathology confirmed large B-cell lymphoma, CD20 positive  Staging PET scan 09/20/2015 with extensive lymphoma involving the abdomen, adrenal glands, and left kidney 2. Renal failure, left hydronephrosis; left ureter stent placed 09/18/2015. Progressive. 3. Left hemiplegia-chronic following a CVA 4. Microcytic anemia secondary to chronic disease, renal failure, surgery, and multiple phlebotomies 5. Fever-resolved on Decadron, Decadron discontinued 09/13/2015 6. Rash/pruritus 09/14/2015-? Contact dermatitis versus drug rash 7. Status post left orchiectomy and placement of a left ureter stent 09/18/2015. Pathology revealed involvement with large B-cell lymphoma.   He remains alert and oriented. He has advanced stage non-Hodgkin's lymphoma. I recommend beginning systemic therapy on 09/22/2015. He is at increased risk  for developing toxicity secondary to the renal failure, malnutrition, and immobility. I recommend CVP-rituximab with rasburicase prophylaxis. I discussed the case  with the nephrology service and they will continue following his renal function.  Recommendations: 1. Transfer to third floor oncology unit for chemotherapy 09/22/2015 2. Place PICC for chemotherapy 3. I will discuss the details of the chemotherapy with Mr. Baize in the a.m. on 09/22/2015. I discussed the case with his daughter by telephone on 09/21/2015.   LOS: 2 days   Carlos Black  09/21/2015, 5:03 PM

## 2015-09-21 NOTE — Clinical Social Work Placement (Signed)
   CLINICAL SOCIAL WORK PLACEMENT  NOTE  Date:  09/21/2015  Patient Details  Name: Carlos Black MRN: [id number] Date of Birth: 03/07/1951  Clinical Social Work is seeking post-discharge placement for this patient at the [place] level of care (*CSW will initial, date and re-position this form in  chart as items are completed):  Yes   Patient/family provided with [facility] Work Department's list of facilities offering this level of care within the geographic area requested by the patient (or if unable, by the patient's family).  Yes   Patient/family informed of their freedom to choose among providers that offer the needed level of care, that participate in Medicare, Medicaid or managed care program needed by the patient, have an available bed and are willing to accept the patient.  Yes   Patient/family informed of [facility]'s ownership interest in [facility] and [facility], as well as of the fact that they are under no obligation to receive care at these facilities.  PASRR submitted to EDS on       PASRR number received on       Existing PASRR number confirmed on 09/21/15     FL2 transmitted to all facilities in geographic area requested by pt/family on 09/21/15     FL2 transmitted to all facilities within larger geographic area on       Patient informed that his/her managed care company has contracts with or will negotiate with certain facilities, including the following:            Patient/family informed of bed offers received.  Patient chooses bed at       Physician recommends and patient chooses bed at      Patient to be transferred to   on  .  Patient to be transferred to facility by       Patient family notified on   of transfer.  Name of family member notified:        PHYSICIAN Please sign FL2     Additional Comment:    _______________________________________________ Ladell Pier, LCSW 09/21/2015, 9:46  PM

## 2015-09-21 NOTE — Evaluation (Signed)
Physical Therapy Evaluation Patient Details Name: Odessa Morren MRN: [id number] DOB: Jul 09, 1951 Today's Date: 09/21/2015   History of Present Illness  64 yo male admitted with AKI. 10/24-cystoscopy, L ureteral stent, L radical orchiectomy. Hx of CVA with L residual weakness, DM, HTN, CAD.   Clinical Impression  On eval, pt required Total assist +2 for bed mobility-sat EOB with Min-Mod assist for at least 8 minutes. Pt fatigues easily. Unable to safely attempt standing or transfer on today. Pt did c/o dizziness while sitting but he was unable to continue sitting long enough to get BP in sitting. BP supine: 127/78, O2 97% on 3L. Remained on  O2 during session. Recommend return to SNF.   Follow Up Recommendations SNF    Equipment Recommendations  None recommended by PT    Recommendations for Other Services OT consult     Precautions / Restrictions Precautions Precautions: Fall Precaution Comments: left hemiparesis from previous CVA Restrictions Weight Bearing Restrictions: No      Mobility  Bed Mobility Overal bed mobility: Needs Assistance Bed Mobility: Supine to Sit;Sit to Supine     Supine to sit: Total assist;+2 for physical assistance;+2 for safety/equipment;HOB elevated Sit to supine: Total assist;+2 for physical assistance;+2 for safety/equipment;HOB elevated   General bed mobility comments: Assist for trunk and bil LEs. Pt reached out for bedrail with R UE and attempted to assist. Utilized bedpad for scooting, positioning. Increased time.   Transfers                 General transfer comment: unable to safely attempt on today.   Ambulation/Gait                Stairs            Wheelchair Mobility    Modified Rankin (Stroke Patients Only)       Balance Overall balance assessment: Needs assistance Sitting-balance support: Single extremity supported;Feet supported Sitting balance-Leahy Scale: Poor Sitting balance - Comments: Pt also  leaning to R side due to reliance on R bedrail to aid with supporting himself. Able to sit statically with very close Min guard assist for ~5-10 seconds before leaninng/LOB Postural control: Posterior lean                                   Pertinent Vitals/Pain Pain Assessment: No/denies pain    Home Living Family/patient expects to be discharged to:: Skilled nursing facility                      Prior Function Level of [place]: Needs assistance   Gait / Transfers Assistance Needed: ambulatory with RW and L AFO during last hosptial admission-see previous notes           Hand Dominance   Dominant Hand: Right    Extremity/Trunk Assessment           LUE Deficits / Details: old CVA only gross A   Lower Extremity Assessment: LLE deficits/detail;RLE deficits/detail RLE Deficits / Details: generalized weakness LLE Deficits / Details: old CVA. Pt unable to mobilize L LE on today     Communication   Communication: HOH  Cognition Arousal/Alertness: Awake/alert Behavior During Therapy: Flat affect Overall Cognitive Status: History of cognitive impairments - at baseline. Pt did not remember walking with therapist on last, most recent hospital stay.  General Comments      Exercises        Assessment/Plan    PT Assessment Patient needs continued PT services  PT Diagnosis Difficulty walking;Hemiplegia non-dominant side;Generalized weakness;Abnormality of gait;Altered mental status   PT Problem List Decreased strength;Decreased range of motion;Decreased activity tolerance;Decreased balance;Decreased mobility;Decreased coordination;Decreased cognition;Decreased knowledge of use of DME  PT Treatment Interventions DME instruction;Gait training;Functional mobility training;Therapeutic activities;Therapeutic exercise;Patient/family education;Balance training;Neuromuscular re-education   PT Goals (Current goals can be found  in the Care Plan section) Acute Rehab PT Goals Patient Stated Goal: None stated PT Goal Formulation: With patient Time For Goal Achievement: 10/05/15 Potential to Achieve Goals: Fair    Frequency Min 3X/week   Barriers to discharge        Co-evaluation               End of Session   Activity Tolerance: Patient limited by fatigue (Limited by dizziness-pt c/o while sitting) Patient left: in bed;with call bell/phone within reach;with bed alarm set           Time: 6629-4765 PT Time Calculation (min) (ACUTE ONLY): 19 min   Charges:   PT Evaluation $Initial PT Evaluation Tier I: 1 Procedure     PT G Codes:        Weston Anna, MPT Pager: [phone]

## 2015-09-21 NOTE — NC FL2 (Signed)
[place] LEVEL OF CARE SCREENING TOOL     IDENTIFICATION  Patient Name: Carlos Black Birthdate: 1951/08/07 Sex: male Admission Date (Current Location): 09/07/2015  [facility] and [place] Number: Herbalist and Address:  [facility],  [place], [place]      Provider Number: [id number]  Attending Physician Name and Address:  Charlynne Cousins, MD  Relative Name and Phone Number:       Current Level of Care: Hospital Recommended Level of Care: [place] Prior Approval Number:    Date Approved/Denied:   PASRR Number:  [id number]   Discharge Plan: SNF    Current Diagnoses: Patient Active Problem List   Diagnosis Date Noted  . Pressure ulcer 09/16/2015  . AKI (acute kidney injury) ([place]) 09/16/2015  . Fever 08/27/2015  . Hypoxia 09/18/2015  . 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 Back pain   . Pelvic mass in male   . Weakness 09/05/2015  . SIRS (systemic inflammatory response syndrome) ([place]) 09/05/2015  . Hypotension 09/05/2015  . Hyperkalemia 09/05/2015  . ARF (acute renal failure) ([place]) 09/05/2015  . Dehydration 09/05/2015  . Left sided numbness 09/05/2015  . H/O: CVA (cerebrovascular accident) 09/05/2015  . CAD (coronary artery disease) 09/05/2015  . Oral thrush 09/05/2015  . Dysphagia 09/05/2015  . Anemia 09/05/2015  . Hyponatremia 09/05/2015  . Weakness of left side of body   . Hypertension 12/21/2011  . Headache(784.0) 12/21/2011  . Diabetes mellitus ([place]) 12/21/2011  . Hyperglycemia 12/21/2011  . Leukocytosis 12/21/2011    Orientation ACTIVITIES/SOCIAL BLADDER RESPIRATION    Self, Time, Situation, Place  Active Continent  (3L)  BEHAVIORAL SYMPTOMS/MOOD NEUROLOGICAL BOWEL NUTRITION STATUS      Incontinent  (Diet Heart)  PHYSICIAN VISITS COMMUNICATION OF NEEDS Height & Weight Skin    Verbally 5\' 5"  (165.1  cm) 169 lbs. PU Stage and Appropriate Care (Wound care to area of deep tissue pressure injury (DTPI) at sacrum: Cleanse with NS, pat gently dry. Cover with soft silicone foam dressing for sacrum Kellie Simmering [phone]). Peel back each shift to inspect area and document changes. Position patient off of )          AMBULATORY STATUS RESPIRATION    Assist extensive  (3L)      Personal Care Assistance Level of Assistance  Bathing, Dressing Bathing Assistance: Maximum assistance   Dressing Assistance: Maximum assistance      Functional Limitations Info                SPECIAL CARE FACTORS FREQUENCY  PT (By licensed PT)     PT Frequency: 5 x week             Additional Factors Info  Code Status, Allergies Code Status Info: FULL code status Allergies Info: No Known Allergies           Current Medications (09/21/2015): Current Facility-Administered Medications  Medication Dose Route Frequency Provider Last Rate Last Dose  . 0.9 %  sodium chloride infusion   Intravenous Continuous Charlynne Cousins, MD 10 mL/hr at 09/21/15 0945    . acetaminophen (TYLENOL) tablet 650 mg  650 mg Oral Q6H PRN Domenic Polite, MD   650 mg at 09/21/15 1354   Or  . acetaminophen (TYLENOL) suppository 650 mg  650 mg Rectal Q6H PRN Domenic Polite, MD      . aspirin chewable tablet 81 mg  81 mg Oral Daily  Domenic Polite, MD   81 mg at 09/21/15 1130  . atorvastatin (LIPITOR) tablet 10 mg  10 mg Oral Daily Domenic Polite, MD   10 mg at 09/21/15 1130  . enoxaparin (LOVENOX) injection 30 mg  30 mg Subcutaneous Q24H Domenic Polite, MD   30 mg at 09/21/15 1827  . feeding supplement (GLUCERNA SHAKE) (GLUCERNA SHAKE) liquid 237 mL  237 mL Oral TID BM Clayton Bibles, RD      . fludeoxyglucose F - 18 (FDG) injection 8 milli Curie  8 milli Curie Intravenous Once PRN Ladell Pier, MD   [place] at 09/20/15 1345  . ondansetron (ZOFRAN) tablet 4 mg  4 mg Oral Q6H PRN Domenic Polite, MD       Or  .  ondansetron [facility]) injection 4 mg  4 mg Intravenous Q6H PRN Domenic Polite, MD      . oxyCODONE (Oxy IR/ROXICODONE) immediate release tablet 5 mg  5 mg Oral Q4H PRN Domenic Polite, MD   5 mg at 09/20/15 2054  . pantoprazole (PROTONIX) EC tablet 40 mg  40 mg Oral QHS Domenic Polite, MD   40 mg at 09/20/15 2054  . polyethylene glycol (MIRALAX / GLYCOLAX) packet 17 g  17 g Oral Daily PRN Domenic Polite, MD       Do not use this list as official medication orders. Please verify with discharge summary.  Discharge Medications:   Medication List    ASK your doctor about these medications        allopurinol 300 MG tablet  Commonly known as:  ZYLOPRIM  Take 300 mg by mouth daily. Start 09/20/15 for 10 days due to elevated uric acid     amLODipine 10 MG tablet  Commonly known as:  NORVASC  Take 1 tablet (10 mg total) by mouth daily.     aspirin 81 MG chewable tablet  Chew 1 tablet (81 mg total) by mouth daily.     atorvastatin 10 MG tablet  Commonly known as:  LIPITOR  Take 1 tablet (10 mg total) by mouth daily.     ondansetron 4 MG tablet  Commonly known as:  ZOFRAN  Take 1 tablet (4 mg total) by mouth every 6 (six) hours as needed for nausea.     oxyCODONE 5 MG immediate release tablet  Commonly known as:  Oxy IR/ROXICODONE  Take 1 tablet (5 mg total) by mouth every 4 (four) hours as needed for moderate pain. DEA# [id number]     pantoprazole 40 MG tablet  Commonly known as:  PROTONIX  Take 1 tablet (40 mg total) by mouth at bedtime.        Relevant Imaging Results:  Relevant Lab Results:  Recent Labs    Additional Information Pt is receiving chemotherapy, current dose is being given while inpatient in the hospital, but further doses will be given at [place], [place], LCSW

## 2015-09-21 NOTE — Clinical Social Work Note (Signed)
Clinical Social Work Assessment  Patient Details  Name: Carlos Black MRN: [id number] Date of Birth: August 18, 1951  Date of referral:  09/21/15               Reason for consult:  Discharge Planning                Permission sought to share information with:  Family Supports Permission granted to share information::  Yes, Verbal Permission Granted  Name::     Tammy Whitehead/daughther and Hassan Rowan Schwartzman/wife  Agency::  [place] SNFs  Relationship::  daughter and wife   Contact Information:  [phone]  Housing/Transportation Living arrangements for the past 2 months:  [place] of Information:  Adult Children, Spouse Patient Interpreter Needed:  None Criminal Activity/Legal Involvement Pertinent to Current Situation/Hospitalization:  No - Comment as needed Significant Relationships:  Spouse, Adult Children Lives with:  Facility Resident Do you feel safe going back to the place where you live?   (pt family would like to explore other SNF options. ) Need for family participation in patient care:  Yes (Comment)  Care giving concerns:  Pt admitted from [facility]. Pt wife and pt daughter expressed concern about facility in regard to staffing issues and unsure if facility can continue to meet pt needs.    Social Worker assessment / plan:  CSW received referral that pt admitted from [facility].   CSW attempted to assess pt at bedside. Pt sleeping soundly and did not awake to CSW entering the room. CSW contacted pt wife, Hassan Rowan via telephone. CSW introduced self and explained role. Pt confirmed that pt recently admitted to [facility]. Pt wife discussed that pt family has not been completely satisfied with the facility and want to explore other options. CSW explained that CSW can explore other options, but CSW is unsure if pt will have other options given [facility] and need for chemotherapy. Pt wife  expressed understanding that if pt had no other options then pt would have to return to [facility]. CSW contacted pt daughter, Lynelle Smoke per pt wife request. CSW explained the same to pt daughter. CSW encouraged pt daughter to speak to [facility] surrounding concerns as well in case pt has to return to facility. Pt daughter agreeable to do so.   CSW completed FL2 and initiated SNF search to [facility].  CSW to follow up with pt and pt family with bed offers. Pt has [employer] which requires authorization prior to pt discharge to SNF.   CSW to continue to follow.   Employment status:  Retired, Disabled (Comment on whether or not currently receiving Disability) Insurance information:  Managed Medicare PT Recommendations:  [place] / Referral to community resources:  [place]  Patient/Family's Response to care:  Pt unable to participate in assessment due to pt sleeping. Pt daughter and pt wife involved in care. Pt family hopeful there may be another option for SNF, but recognize that if there is not another option then pt would have to return to [facility].  Patient/Family's Understanding of and Emotional Response to Diagnosis, Current Treatment, and Prognosis:  Pt wife and pt daughter displayed knowledge surrounding plan for chemotherapy and surrounding pt medical needs.   Emotional Assessment Appearance:  Appears stated age Attitude/Demeanor/Rapport:  Unable to Assess (pt sleeping ) Affect (typically observed):  Unable to Assess (pt sleeping) Orientation:  Oriented to Self, Oriented  to Place, Oriented to  Time, Oriented to Situation Alcohol / Substance use:  Never Used Psych involvement (Current and /or in the community):  No (Comment)  Discharge Needs  Concerns to be addressed:  Discharge Planning Concerns Readmission within the last 30 days:  Yes Current discharge risk:   None Barriers to Discharge:  Continued Medical Work up   Ladell Pier, [place] 09/21/2015, 9:40 PM  [phone]

## 2015-09-22 ENCOUNTER — Telehealth: Payer: Self-pay | Admitting: Oncology

## 2015-09-22 DIAGNOSIS — E440 Moderate protein-calorie malnutrition: Secondary | ICD-10-CM

## 2015-09-22 DIAGNOSIS — N509 Disorder of male genital organs, unspecified: Secondary | ICD-10-CM

## 2015-09-22 LAB — COMPREHENSIVE METABOLIC PANEL
ALBUMIN: 2.3 g/dL — AB (ref 3.5–5.0)
ALT: 10 U/L — ABNORMAL LOW (ref 17–63)
ANION GAP: 11 (ref 5–15)
AST: 39 U/L (ref 15–41)
Alkaline Phosphatase: 122 U/L (ref 38–126)
BILIRUBIN TOTAL: 0.3 mg/dL (ref 0.3–1.2)
BUN: 34 mg/dL — AB (ref 6–20)
CHLORIDE: 108 mmol/L (ref 101–111)
CO2: 17 mmol/L — ABNORMAL LOW (ref 22–32)
Calcium: 7.9 mg/dL — ABNORMAL LOW (ref 8.9–10.3)
Creatinine, Ser: 5.77 mg/dL — ABNORMAL HIGH (ref 0.61–1.24)
GFR calc Af Amer: 11 mL/min — ABNORMAL LOW (ref >60)
GFR, EST NON AFRICAN AMERICAN: 9 mL/min — AB (ref >60)
Glucose, Bld: 158 mg/dL — ABNORMAL HIGH (ref 65–99)
POTASSIUM: 4.5 mmol/L (ref 3.5–5.1)
Sodium: 136 mmol/L (ref 135–145)
TOTAL PROTEIN: 5.6 g/dL — AB (ref 6.5–8.1)

## 2015-09-22 LAB — URIC ACID: URIC ACID, SERUM: 10 mg/dL — AB (ref 4.4–7.6)

## 2015-09-22 MED ORDER — PREDNISONE 20 MG PO TABS: 80.0000 mg | ORAL_TABLET | Freq: Every day | ORAL | Status: DC

## 2015-09-22 MED ORDER — SODIUM CHLORIDE 0.9 % IJ SOLN: 10.0000 mL | INTRAMUSCULAR | Status: DC | PRN

## 2015-09-22 MED ORDER — EPINEPHRINE HCL 1 MG/ML IJ SOLN: 0.5000 mg | Freq: Once | INTRAMUSCULAR | Status: DC | PRN

## 2015-09-22 MED ORDER — SODIUM CHLORIDE 0.9 % IV SOLN
Freq: Once | INTRAVENOUS | Status: AC
  Administered 2015-09-22: 13:00:00 via INTRAVENOUS
  Filled 2015-09-22: qty 8

## 2015-09-22 MED ORDER — DOXORUBICIN HCL CHEMO IV INJECTION 2 MG/ML
35.0000 mg/m2 | Freq: Once | INTRAVENOUS | Status: AC
  Administered 2015-09-22: 66 mg via INTRAVENOUS
  Filled 2015-09-22: qty 33

## 2015-09-22 MED ORDER — METHYLPREDNISOLONE SODIUM SUCC 125 MG IJ SOLR: 125.0000 mg | Freq: Once | INTRAMUSCULAR | Status: DC | PRN

## 2015-09-22 MED ORDER — DEXTROSE 5 % IV SOLN
INTRAVENOUS | Status: DC
  Administered 2015-09-22 – 2015-09-24 (×2): via INTRAVENOUS
  Filled 2015-09-22 (×5): qty 850

## 2015-09-22 MED ORDER — ALTEPLASE 2 MG IJ SOLR
2.0000 mg | Freq: Once | INTRAMUSCULAR | Status: DC | PRN
  Filled 2015-09-22: qty 2

## 2015-09-22 MED ORDER — HEPARIN SOD (PORK) LOCK FLUSH 100 UNIT/ML IV SOLN
500.0000 [IU] | Freq: Once | INTRAVENOUS | Status: DC | PRN
  Filled 2015-09-22: qty 5

## 2015-09-22 MED ORDER — SODIUM CHLORIDE 0.9 % IV SOLN
INTRAVENOUS | Status: DC
  Administered 2015-09-22: 08:00:00 via INTRAVENOUS

## 2015-09-22 MED ORDER — HEPARIN SOD (PORK) LOCK FLUSH 100 UNIT/ML IV SOLN
250.0000 [IU] | Freq: Once | INTRAVENOUS | Status: DC | PRN
  Filled 2015-09-22: qty 3

## 2015-09-22 MED ORDER — TBO-FILGRASTIM 300 MCG/0.5ML ~~LOC~~ SOSY
300.0000 ug | PREFILLED_SYRINGE | Freq: Every day | SUBCUTANEOUS | Status: DC
  Administered 2015-09-23 – 2015-09-28 (×6): 300 ug via SUBCUTANEOUS
  Filled 2015-09-22 (×9): qty 0.5

## 2015-09-22 MED ORDER — SODIUM CHLORIDE 0.9 % IV SOLN: Freq: Once | INTRAVENOUS | Status: DC | PRN

## 2015-09-22 MED ORDER — ALBUTEROL SULFATE (2.5 MG/3ML) 0.083% IN NEBU: 2.5000 mg | INHALATION_SOLUTION | Freq: Once | RESPIRATORY_TRACT | Status: DC | PRN

## 2015-09-22 MED ORDER — SODIUM BICARBONATE 8.4 % IV SOLN
INTRAVENOUS | Status: DC
  Administered 2015-09-22: 13:00:00 via INTRAVENOUS
  Filled 2015-09-22: qty 1000

## 2015-09-22 MED ORDER — PREDNISONE 20 MG PO TABS
80.0000 mg | ORAL_TABLET | Freq: Every day | ORAL | Status: AC
  Administered 2015-09-23 – 2015-09-27 (×5): 80 mg via ORAL
  Filled 2015-09-22 (×5): qty 4

## 2015-09-22 MED ORDER — EPINEPHRINE HCL 0.1 MG/ML IJ SOSY: 0.2500 mg | PREFILLED_SYRINGE | Freq: Once | INTRAMUSCULAR | Status: DC | PRN

## 2015-09-22 MED ORDER — SODIUM CHLORIDE 0.9 % IV SOLN
500.0000 mg/m2 | Freq: Once | INTRAVENOUS | Status: AC
  Administered 2015-09-22: 960 mg via INTRAVENOUS
  Filled 2015-09-22: qty 48

## 2015-09-22 MED ORDER — ACETAMINOPHEN 325 MG PO TABS
650.0000 mg | ORAL_TABLET | Freq: Once | ORAL | Status: AC
Start: 2015-09-22 — End: 2015-09-22
  Administered 2015-09-22: 650 mg via ORAL
  Filled 2015-09-22: qty 2

## 2015-09-22 MED ORDER — FAMOTIDINE IN NACL 20-0.9 MG/50ML-% IV SOLN: 20.0000 mg | Freq: Once | INTRAVENOUS | Status: DC | PRN

## 2015-09-22 MED ORDER — SODIUM CHLORIDE 0.9 % IJ SOLN
10.0000 mL | Freq: Two times a day (BID) | INTRAMUSCULAR | Status: DC
  Administered 2015-09-22 – 2015-09-29 (×12): 10 mL
  Administered 2015-09-30: 20 mL
  Administered 2015-09-30: 10 mL

## 2015-09-22 MED ORDER — SODIUM CHLORIDE 0.9 % IV SOLN
375.0000 mg/m2 | Freq: Once | INTRAVENOUS | Status: AC
  Administered 2015-09-22: 700 mg via INTRAVENOUS
  Filled 2015-09-22: qty 50

## 2015-09-22 MED ORDER — SODIUM CHLORIDE 0.9 % IV SOLN
Freq: Once | INTRAVENOUS | Status: AC
  Administered 2015-09-22: 12:00:00 via INTRAVENOUS

## 2015-09-22 MED ORDER — DIPHENHYDRAMINE HCL 50 MG/ML IJ SOLN: 50.0000 mg | Freq: Once | INTRAMUSCULAR | Status: DC | PRN

## 2015-09-22 MED ORDER — HOT PACK MISC ONCOLOGY: 1.0000 | Freq: Once | Status: AC | PRN

## 2015-09-22 MED ORDER — EPINEPHRINE HCL 0.1 MG/ML IJ SOSY
0.2500 mg | PREFILLED_SYRINGE | Freq: Once | INTRAMUSCULAR | Status: DC | PRN
Start: 2015-09-22 — End: 2015-10-02

## 2015-09-22 MED ORDER — DIPHENHYDRAMINE HCL 50 MG PO CAPS
50.0000 mg | ORAL_CAPSULE | Freq: Once | ORAL | Status: AC
  Administered 2015-09-22: 50 mg via ORAL
  Filled 2015-09-22: qty 1

## 2015-09-22 MED ORDER — SODIUM CHLORIDE 0.9 % IV SOLN
6.0000 mg | Freq: Once | INTRAVENOUS | Status: AC
  Administered 2015-09-22: 6 mg via INTRAVENOUS
  Filled 2015-09-22: qty 4

## 2015-09-22 MED ORDER — SODIUM CHLORIDE 0.9 % IV SOLN
2.0000 mg | Freq: Once | INTRAVENOUS | Status: AC
  Administered 2015-09-22: 2 mg via INTRAVENOUS
  Filled 2015-09-22: qty 2

## 2015-09-22 MED ORDER — SODIUM CHLORIDE 0.9 % IJ SOLN: 3.0000 mL | INTRAMUSCULAR | Status: DC | PRN

## 2015-09-22 MED ORDER — COLD PACK MISC ONCOLOGY: 1.0000 | Freq: Once | Status: AC | PRN

## 2015-09-22 MED ORDER — DIPHENHYDRAMINE HCL 50 MG/ML IJ SOLN: 25.0000 mg | Freq: Once | INTRAMUSCULAR | Status: DC | PRN

## 2015-09-22 NOTE — Progress Notes (Signed)
Chemotherapy education completed with spouse and daughter, patient very fatigued.  Chemotherapy consent signed by daughter, Lynelle Smoke.  Wife in agreement.

## 2015-09-22 NOTE — Progress Notes (Signed)
CSW continuing to follow.   CSW received phone call from pt daughter, Lynelle Smoke. Pt daughter inquired about HCPOA. CSW discussed that nursing unit has the [facility] packets, but pt must be completely alert and oriented in order to complete paperwork. Pt daughter stated that at this time pt is not completely alert and oriented and CSW explained that a notary will not notarize document if pt not alert and oriented. Pt daughter expressed understanding.   CSW discussed with pt daughter that no other facilities had made bed offers. Pt daughter expressed that she feels pt will remain in the hospital many more days as chemotherapy was initiated today. CSW provided support as pt daughter expressed that she feels pt may be "taking a turn for the worst". CSW discussed that we can take things day by day and CSW can follow up on Monday. Pt daughter agreeable.  CSW to continue to follow to provide support and assist with pt disposition needs.   Alison Murray, MSW, [place] Work [phone]

## 2015-09-22 NOTE — Progress Notes (Signed)
Manual calculation of BSA and dosing for doxorubicin, cyyclophosphamide, vincristine, and rituxan completed with secondary verification by Kirkland Hun RN. All values verified, order to give chemo despite labs, chemo started

## 2015-09-22 NOTE — Progress Notes (Signed)
[facility] PROGRESS NOTE    Progress Note   Carlos Black RSW:[id number] DOB: 1951-05-31 DOA: 09/08/2015 PCP: [name], MD   Brief Narrative:   Carlos Black is an 64 y.o. male with a history of CVA and left residual weakness, diabetes mellitus type 2, chronic kidney disease stage III recently diagnosed with high-grade B-cell lymphoma which he has not started chemotherapy with ongoing fevers since last admission came back to the oncology office for chemotherapy teaching was found to have fever with worsening renal function  Assessment/Plan:   AKI (acute kidney injury) ([place]): - Baseline creatinine 1.9-2.3, on admission 3.3, he remains oliguric. - He is about 3 L positive, basic metabolic panel is pending. - Renal ultrasound showed no hydronephrosis and ill-defined hypoechoic mass in the left lower pole of the kidney likely lymphoma. - Insert PICC line if renal approves, consulted renal for possible dialysis. Appreciate assistance. - We need to transfer him to come and when?  High-grade B-cell aggressive lymphoma: Fever likely due to high-grade lymphoma. Continue  IV fluid hydration Oncology on board are considering starting chemotherapy on 09/22/2015.  Mild hypoxia: Mildly hypoxic on admission, now resolved.  History of CVA (cerebrovascular accident) Continue aspirin.  Sacral decubitus pressure Ulcer: Consult wound care.  Fever: Likely due to lymphoma, infectious workup has been negative.     DVT Prophylaxis - Lovenox ordered.  Family Communication: none Disposition Plan: Home when stable. Code Status:     Code Status Orders        Start     Ordered   09/21/2015 1440  Full code   Continuous     09/10/2015 1439        IV Access:    Peripheral IV   Procedures and diagnostic studies:   Ct Head Wo Contrast  09/20/2015  CLINICAL DATA:  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 Confusion.  EXAM: CT HEAD WITHOUT CONTRAST TECHNIQUE: Contiguous axial images were obtained from the base of the skull through the vertex without contrast. COMPARISON:  MRI 09/06/2015 and head CT 09/05/2015 FINDINGS: No evidence for acute hemorrhage, mass lesion, midline shift, hydrocephalus or large infarct. Subtle low-density in the periventricular white matter appears unchanged. There is an old lacune infarct in the anterior limb of the right internal capsule. Visualized paranasal sinuses and mastoid air cells are clear. No acute bone abnormality. No suspicious bony lesions. IMPRESSION: No acute intracranial abnormality. Subtle white matter changes suggest chronic small vessel ischemic disease. Old lacune in the right internal capsule. Electronically Signed   By: Markus Daft M.D.   On: 09/20/2015 15:58   US Renal  09/20/2015  CLINICAL DATA:  Acute kidney injury. EXAM: RENAL / URINARY TRACT ULTRASOUND COMPLETE COMPARISON:  CT 09/07/2015, PET CT 05/21/2015 FINDINGS: Right Kidney: Length: 10.7 cm. Perinephric fluid is identified. No focal renal mass or hydronephrosis identified sonographically. Left Kidney: Length: 11.5 cm. Renal parenchyma is heterogeneous. The lower pole of the kidney appears more hypoechoic and mildly enlarged compared with the remainder of the kidney, consistent with lymphomatous involvement as identified on PET-CT. No hydronephrosis. Bladder: Foley catheter decompresses the bladder. IMPRESSION: 1. No hydronephrosis. 2. Ill-defined hypoechoic lower pole of the left kidney, consistent with lymphoma. Electronically Signed   By: Nolon Nations M.D.   On: 09/20/2015 16:33   Nm Pet Image Initial (pi) Skull Base To Thigh  09/20/2015  CLINICAL DATA:  Initial treatment strategy for high-grade B-cell non-Hodgkin's lymphoma. EXAM: NUCLEAR MEDICINE PET SKULL BASE TO THIGH  TECHNIQUE: 8.0 mCi F-18 FDG was injected intravenously. Full-ring PET imaging was performed from the skull base to thigh after the  radiotracer. CT data was obtained and used for attenuation correction and anatomic localization. FASTING BLOOD GLUCOSE:  Value: 125 mg/dl COMPARISON:  Multiple exams, including 09/07/2015 FINDINGS: NECK No hypermetabolic lymph nodes in the neck. CHEST Pleural-based right upper lobe mass posteriorly measures 2.6 by 1.2 cm on image 14 series 6, and has maximum standard uptake value 14.3. Right hilar lymph node measures approximately 2 cm in short axis and has maximum standard uptake value 19.3. Left infrahilar node is difficult to measure but has maximum standard uptake value of 6.4. Right lower lobe posterolateral pleural-based nodule measures INSERT flank and has maximum standard uptake value 5.3. Moderate size left pleural effusion has faintly increased activity on PET-CT. Coronary atherosclerosis. Small posterior paraspinal lymph node along the anterior margin of the left eleventh rib measures 0.8 cm in short axis on image 93 series 4 and has maximum standard uptake value 11.1. ABDOMEN/PELVIS Hypermetabolic bilateral adrenal masses with extensive abnormal hypermetabolic soft tissue masses within along the left perirenal fascia, involving the left kidney lower pole, and tracking in the retroperitoneum as a large mass encasing part of the abdominal aorta and proximal iliac vasculature, incompletely encasing the left ureter. On image 137 series 4 this tumor measures 10.7 by 9.7 cm and may be invading the left psoas muscle. A double-J ureteral stent is in place. At the level of the loop of the double-J ureteral catheter, the dominant left retroperitoneal tumor has a maximum standard uptake value of 24.8. The left adrenal mass measures 5.1 by 4.1 cm on image 104 of series 4 and has a maximum standard uptake value of 25.7. The right adrenal mass measures 5.7 by 3.7 cm on image 105 series 4 and has a maximum standard uptake value of 18.5. Tumor tracking along the left pelvic sidewall has maximum standard uptake value of  approximately 22.3. Oval-shaped soft tissue density along the left inguinal hernia measuring 3.0 by 2.0 cm on image 185 series 4 with maximum standard uptake value 23.6. I do not see a separate left testicle and I am suspicious that this may represent a retracted testicle with abnormally high metabolic activity unless the patient has had prior orchectomy. The right testicle seems to have normal activity level, although there is scrotal thickening as well as a right hydrocele within a right inguinal hernia. SKELETON Small focus of muscular activity along the gluteus medius muscle on the left, maximum standard uptake value 6.8. IMPRESSION: 1. Extensive tumor involvement in the abdomen including a very large left retroperitoneal mass encasing the ureter and involving the left kidney and perirenal space; bilateral adrenal malignancy; carry renal tumor deposits on the left ; and either a tumor nodule or more likely tumor involving the left testicle in the left inguinal canal. In the chest, there are hypermetabolic pleural-based nodules as well as hypermetabolic adenopathy. Moderate size left pleural effusion has faintly increased activity on PET-CT and may be malignant. 2. Small focus of muscular activity in the left gluteus medius muscle. Probably malignant. 3. Coronary atherosclerosis. Electronically Signed   By: Van Clines M.D.   On: 09/20/2015 16:06     Medical Consultants:    None.  Anti-Infectives:   Anti-infectives    None      Subjective:    Carlos Black he relates he feels about the same, not having any further pains. Anxious to start chemotherapy.  Objective:  Filed Vitals:   09/21/15 1534 09/21/15 2240 09/22/15 0058 09/22/15 0500  BP: 125/65 148/75  136/67  Pulse: 102 105  97  Temp: 101.8 F (38.8 C) 102.9 F (39.4 C) 98.2 F (36.8 C) 100.2 F (37.9 C)  TempSrc: Axillary Oral Oral Oral  Resp: 28 20  24   Height:      Weight:      SpO2: 98% 100%  98%     Intake/Output Summary (Last 24 hours) at 09/22/15 0724 Last data filed at 09/22/15 0559  Gross per 24 hour  Intake 1337.33 ml  Output    400 ml  Net 937.33 ml   Filed Weights   09/15/2015 1327  Weight: 76.686 kg (169 lb 1 oz)    Exam: Gen:  NAD,  Cardiovascular:  RRR, No M/R/G Chest and lungs:   CTAB Abdomen:  Abdomen soft, NT/ND, + BS Extremities:  No C/E/C   Data Reviewed:    Labs: Basic Metabolic Panel:  Recent Labs Lab 09/18/15 1050 09/20/15 0640 09/21/15 0940  NA 136 136 137  K 4.5 4.0 4.0  CL 103 106 110  CO2 23 20* 18*  GLUCOSE 184* 157* 158*  BUN 29* 30* 30*  CREATININE 3.35* 4.80* 5.35*  CALCIUM 8.5* 7.6* 7.6*   GFR Estimated Creatinine Clearance: 13 mL/min (by C-G formula based on Cr of 5.35). Liver Function Tests:  Recent Labs Lab 09/20/15 0640  AST 37  ALT 14*  ALKPHOS 114  BILITOT 0.8  PROT 5.4*  ALBUMIN 1.9*   No results for input(s): LIPASE, AMYLASE in the last 168 hours. No results for input(s): AMMONIA in the last 168 hours. Coagulation profile No results for input(s): INR, PROTIME in the last 168 hours.  CBC:  Recent Labs Lab 09/18/15 1050 09/20/15 0640  WBC 7.6 9.4  HGB 9.3* 8.2*  HCT 29.4* 25.9*  MCV 79.5 80.2  PLT 237 252   Cardiac Enzymes: No results for input(s): CKTOTAL, CKMB, CKMBINDEX, TROPONINI in the last 168 hours. BNP (last 3 results) No results for input(s): PROBNP in the last 8760 hours. CBG:  Recent Labs Lab 09/12/2015 1510 09/18/2015 1626 09/20/15 0736 09/20/15 1216 09/20/15 1832  GLUCAP 150* 181* 153* 134* 166*   D-Dimer: No results for input(s): DDIMER in the last 72 hours. Hgb A1c: No results for input(s): HGBA1C in the last 72 hours. Lipid Profile: No results for input(s): CHOL, HDL, LDLCALC, TRIG, CHOLHDL, LDLDIRECT in the last 72 hours. Thyroid function studies: No results for input(s): TSH, T4TOTAL, T3FREE, THYROIDAB in the last 72 hours.  Invalid input(s): FREET3 Anemia work  up: No results for input(s): VITAMINB12, FOLATE, FERRITIN, TIBC, IRON, RETICCTPCT in the last 72 hours. Sepsis Labs:  Recent Labs Lab 09/18/15 1050 09/24/2015 1419 09/03/2015 1825 09/20/15 0640  WBC 7.6  --   --  9.4  LATICACIDVEN  --  1.7 2.2*  --    Microbiology Recent Results (from the past 240 hour(s))  C difficile quick scan w PCR reflex     Status: None   Collection Time: 09/13/15  4:00 AM  Result Value Ref Range Status   C Diff antigen NEGATIVE NEGATIVE Final   C Diff toxin NEGATIVE NEGATIVE Final   C Diff interpretation Negative for toxigenic C. difficile  Final  MRSA PCR Screening     Status: None   Collection Time: 09/23/2015 12:26 PM  Result Value Ref Range Status   MRSA by PCR NEGATIVE NEGATIVE Final    Comment:  The GeneXpert MRSA Assay (FDA approved for NASAL specimens only), is one component of a comprehensive MRSA colonization surveillance program. It is not intended to diagnose MRSA infection nor to guide or monitor treatment for MRSA infections.      Medications:   . aspirin  81 mg Oral Daily  . atorvastatin  10 mg Oral Daily  . enoxaparin (LOVENOX) injection  30 mg Subcutaneous Q24H  . feeding supplement (GLUCERNA SHAKE)  237 mL Oral TID BM  . pantoprazole  40 mg Oral QHS   Continuous Infusions: . sodium chloride 10 mL/hr at 09/21/15 0945    Time spent: 25 min   LOS: 3 days   Charlynne Cousins  [facility] Pager [phone]  *Please refer to [email or web address], password [email or web address] to get updated schedule on who will round on this patient, as hospitalists switch teams weekly. If 7PM-7AM, please contact night-coverage at [email or web address], password [email or web address] for any overnight needs.  09/22/2015, 7:24 AM

## 2015-09-22 NOTE — Progress Notes (Signed)
[facility] Rounding Note  Subjective: Unable to converse with patient due to somnolence  Objective Vital signs in last 24 hours: Filed Vitals:   09/21/15 1534 09/21/15 2240 09/22/15 0058 09/22/15 0500  BP: 125/65 148/75  136/67  Pulse: 102 105  97  Temp: 101.8 F (38.8 C) 102.9 F (39.4 C) 98.2 F (36.8 C) 100.2 F (37.9 C)  TempSrc: Axillary Oral Oral Oral  Resp: 28 20  24   Height:      Weight:      SpO2: 98% 100%  98%   Weight change:   Intake/Output Summary (Last 24 hours) at 09/22/15 1207 Last data filed at 09/22/15 0956  Gross per 24 hour  Intake 1457.33 ml  Output    300 ml  Net 1157.33 ml   Physical examination: BP 136/67 mmHg  Pulse 97  Temp(Src) 100.2 F (37.9 C) (Oral)  Resp 24  Ht 5\' 5"  (1.651 m)  Wt 76.686 kg (169 lb 1 oz)  BMI 28.13 kg/m2  SpO2 98%  General: I find him much as described by Dr. Moshe Cipro from yesterday - somnolent, opens eyes, no conversation  Neck: no jugular venous distention  Heart: S1S2 No S3 Mo murmur Lungs: poor effort, decreased breath sounds at the bases but clear anteriorly. Somewhat hyperpneic. Abdomen: distended, tender to deep palpation positive bowel sounds  Extremities: Trace to 1+ pitting edema  Skin: warm and dry  Neuro: somnolent, arousable, but not able to converse with me (RN says conversed with IV team)  Labs: Basic Metabolic Panel:  Recent Labs Lab 09/18/15 1050 09/20/15 0640 09/21/15 0940 09/22/15 1018  NA 136 136 137 136  K 4.5 4.0 4.0 4.5  CL 103 106 110 108  CO2 23 20* 18* 17*  GLUCOSE 184* 157* 158* 158*  BUN 29* 30* 30* 34*  CREATININE 3.35* 4.80* 5.35* 5.77*  CALCIUM 8.5* 7.6* 7.6* 7.9*     Recent Labs Lab 09/20/15 0640 09/22/15 1018  AST 37 39  ALT 14* 10*  ALKPHOS 114 122  BILITOT 0.8 0.3  PROT 5.4* 5.6*  ALBUMIN 1.9* 2.3*     Recent Labs Lab 09/18/15 1050 09/20/15 0640  WBC 7.6 9.4  HGB 9.3* 8.2*  HCT 29.4* 25.9*  MCV 79.5 80.2  PLT 237 252      Recent Labs Lab 09/11/2015 1510 09/17/2015 1626 09/20/15 0736 09/20/15 1216 09/20/15 1832  GLUCAP 150* 181* 153* 134* 166*    Studies/Results: Ct Head Wo Contrast  09/20/2015  CLINICAL DATA:  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 Confusion. EXAM: CT HEAD WITHOUT CONTRAST TECHNIQUE: Contiguous axial images were obtained from the base of the skull through the vertex without contrast. COMPARISON:  MRI 09/06/2015 and head CT 09/05/2015 FINDINGS: No evidence for acute hemorrhage, mass lesion, midline shift, hydrocephalus or large infarct. Subtle low-density in the periventricular white matter appears unchanged. There is an old lacune infarct in the anterior limb of the right internal capsule. Visualized paranasal sinuses and mastoid air cells are clear. No acute bone abnormality. No suspicious bony lesions. IMPRESSION: No acute intracranial abnormality. Subtle white matter changes suggest chronic small vessel ischemic disease. Old lacune in the right internal capsule. Electronically Signed   By: Markus Daft M.D.   On: 09/20/2015 15:58   US Renal  09/20/2015  CLINICAL DATA:  Acute kidney injury. EXAM: RENAL / URINARY TRACT ULTRASOUND COMPLETE COMPARISON:  CT 09/07/2015, PET CT 05/21/2015 FINDINGS: Right Kidney: Length: 10.7 cm. Perinephric fluid is  identified. No focal renal mass or hydronephrosis identified sonographically. Left Kidney: Length: 11.5 cm. Renal parenchyma is heterogeneous. The lower pole of the kidney appears more hypoechoic and mildly enlarged compared with the remainder of the kidney, consistent with lymphomatous involvement as identified on PET-CT. No hydronephrosis. Bladder: Foley catheter decompresses the bladder. IMPRESSION: 1. No hydronephrosis. 2. Ill-defined hypoechoic lower pole of the left kidney, consistent with lymphoma. Electronically Signed   By: Nolon Nations M.D.   On: 09/20/2015 16:33   Nm Pet Image Initial (pi)  Skull Base To Thigh  09/20/2015  CLINICAL DATA:  Initial treatment strategy for high-grade B-cell non-Hodgkin's lymphoma. EXAM: NUCLEAR MEDICINE PET SKULL BASE TO THIGH TECHNIQUE: 8.0 mCi F-18 FDG was injected intravenously. Full-ring PET imaging was performed from the skull base to thigh after the radiotracer. CT data was obtained and used for attenuation correction and anatomic localization. FASTING BLOOD GLUCOSE:  Value: 125 mg/dl COMPARISON:  Multiple exams, including 09/07/2015 FINDINGS: NECK No hypermetabolic lymph nodes in the neck. CHEST Pleural-based right upper lobe mass posteriorly measures 2.6 by 1.2 cm on image 14 series 6, and has maximum standard uptake value 14.3. Right hilar lymph node measures approximately 2 cm in short axis and has maximum standard uptake value 19.3. Left infrahilar node is difficult to measure but has maximum standard uptake value of 6.4. Right lower lobe posterolateral pleural-based nodule measures INSERT flank and has maximum standard uptake value 5.3. Moderate size left pleural effusion has faintly increased activity on PET-CT. Coronary atherosclerosis. Small posterior paraspinal lymph node along the anterior margin of the left eleventh rib measures 0.8 cm in short axis on image 93 series 4 and has maximum standard uptake value 11.1. ABDOMEN/PELVIS Hypermetabolic bilateral adrenal masses with extensive abnormal hypermetabolic soft tissue masses within along the left perirenal fascia, involving the left kidney lower pole, and tracking in the retroperitoneum as a large mass encasing part of the abdominal aorta and proximal iliac vasculature, incompletely encasing the left ureter. On image 137 series 4 this tumor measures 10.7 by 9.7 cm and may be invading the left psoas muscle. A double-J ureteral stent is in place. At the level of the loop of the double-J ureteral catheter, the dominant left retroperitoneal tumor has a maximum standard uptake value of 24.8. The left adrenal  mass measures 5.1 by 4.1 cm on image 104 of series 4 and has a maximum standard uptake value of 25.7. The right adrenal mass measures 5.7 by 3.7 cm on image 105 series 4 and has a maximum standard uptake value of 18.5. Tumor tracking along the left pelvic sidewall has maximum standard uptake value of approximately 22.3. Oval-shaped soft tissue density along the left inguinal hernia measuring 3.0 by 2.0 cm on image 185 series 4 with maximum standard uptake value 23.6. I do not see a separate left testicle and I am suspicious that this may represent a retracted testicle with abnormally high metabolic activity unless the patient has had prior orchectomy. The right testicle seems to have normal activity level, although there is scrotal thickening as well as a right hydrocele within a right inguinal hernia. SKELETON Small focus of muscular activity along the gluteus medius muscle on the left, maximum standard uptake value 6.8. IMPRESSION: 1. Extensive tumor involvement in the abdomen including a very large left retroperitoneal mass encasing the ureter and involving the left kidney and perirenal space; bilateral adrenal malignancy; carry renal tumor deposits on the left ; and either a tumor nodule or more likely tumor involving  the left testicle in the left inguinal canal. In the chest, there are hypermetabolic pleural-based nodules as well as hypermetabolic adenopathy. Moderate size left pleural effusion has faintly increased activity on PET-CT and may be malignant. 2. Small focus of muscular activity in the left gluteus medius muscle. Probably malignant. 3. Coronary atherosclerosis. Electronically Signed   By: Van Clines M.D.   On: 09/20/2015 16:06   Medications: . sodium chloride 75 mL/hr at 09/22/15 0812   . sodium chloride   Intravenous Once  . acetaminophen  650 mg Oral Once  . aspirin  81 mg Oral Daily  . cyclophosphamide  500 mg/m2 (Treatment Plan Actual) Intravenous Once  . diphenhydrAMINE  50 mg  Oral Once  . DOXOrubicin  35 mg/m2 (Treatment Plan Actual) Intravenous Once  . enoxaparin (LOVENOX) injection  30 mg Subcutaneous Q24H  . feeding supplement (GLUCERNA SHAKE)  237 mL Oral TID BM  . ondansetron (ZOFRAN) IVPB [facility] +/- dexamethasone   Intravenous Once  . pantoprazole  40 mg Oral QHS  . [START ON 09/23/2015] predniSONE  80 mg Oral Q breakfast  . rasburicase (ELITEK) IV infusion  6 mg Intravenous Once  . riTUXimab (RITUXAN) IV infusion  375 mg/m2 (Treatment Plan Actual) Intravenous Once  . sodium chloride  10-40 mL Intracatheter Q12H  . [START ON 09/23/2015] Tbo-Filgrastim  300 mcg Subcutaneous q1800  . vinCRIStine (ONCOVIN) CHEMO IV infusion  2 mg Intravenous Once    Background:  64 year old white male with some baseline CKD3 (1.2-1.5 as far back as 2013), DM, HTN, CAD, prior stroke with left sided weakness, with recent diagnosis of large B-cell lymphoma giving a mass effect to his left ureter (status post stenting) but also likely lymphoma involvement of the left kidney.   By way of review he had AKI on CKD early October (10/11) with peak creatinine at that time 3.4, improved with discontinuation of ARB and correction of hypotension to 1.99 on 10/20.  Creatinine worsened back to 3.35 on 10/24 despite JJ stent placement on left due to large mass obstructing left ureter (no residual hydronephrosis) with the thought that he may have lymphomatous involvement of both kidneys. We were asked to see because of worsening renal function on 10/27 with creatinine at that time up to 5.35.   1. Acute renal failure (on CKD3) - acute on chronic renal failure continues to worsen.  Most recent imaging negative for obstruction but positive for likely lymphoma involvement of the left kidney. Nonoliguric with foley. No identifiable recent nephrotoxins. Is possible the patient was hypotensive on admission 10/24 and has ATN that could plateau and improve - but this is not the case so far as creatinine  continues to rise. However, it is also possible that this lymphoma involvement of the kidneys will continue to cause worsening of acute on chronic renal failure. There are no absolute indications for dialysis at this time. Dr. Benay Spice has decided to proceed with chemotherapy (if this IS lymphoma causing his AKI this is the only chance he has for improvement in renal function but likely will not be a "quick fix" and he is also of course at risk for tumor lysis syndrome). I find him today much as Dr. Moshe Cipro did on initial consultation - cannot carry on a conversation with him at all about his kidney failure. He may very well require dialysis in the next few days but no absolute indications as of today. He is still getting some slow IVF which I will change to isotonic sodium  bicarbonate for his metabolic acidosis.   2. Hypertension/volume - Is third spacing secondary to hypoalbuminemia. Don't want to volume overload him, but feel he needs isotonic bicarb - will run at 50/hour.  3. Anemia - likely secondary to his malignancy and his CKD. Management per oncology  4. B-cell lymphoma - likely causing most of his clinical presentation. Dr. Benay Spice has elected to proceed with treatment - he will receive  Cytoxan/adriamycin/vincristine/rituximab/decadron (plus rasburicase in hopes of mitigating against possible TLS) 5. Fevers - source ?Marland Kitchen Possibly tumor related?   Jamal Maes, MD [facility] [phone] pager 09/22/2015, 12:07 PM

## 2015-09-22 NOTE — Progress Notes (Signed)
   09/22/15 1400  Clinical Encounter Type  Visited With Patient and family together  Visit Type Initial;Social support  Referral From Nurse  Consult/Referral To None  Recommendations Follow up with pt  Spiritual Encounters  Spiritual Needs Emotional  Stress Factors  Patient Stress Factors Health changes;Exhausted  Family Stress Factors Exhausted;Major life changes;Lack of knowledge   Counseling intern provided brief emotional support to pt and pt family using person-centered interventions. Pt was slightly disoriented due to actively receiving medication. Several of the pt's family members were present at the bedside, including his wife, his sister-in-law, and two other family members. Counseling intern processed with pt's wife, who discussed feelings of being overwhelmed with the many life changes that have occurred over the past 2-3 weeks. Counseling intern told family she will follow-up later this evening.   Duffy Rhody Counseling Intern

## 2015-09-22 NOTE — Care Management Important Message (Signed)
Important Message  Patient Details  Name: Carlos Black MRN: [id number] Date of Birth: 04-08-1951   Medicare Important Message Given:  Yes-second notification given    Shelda Altes 09/22/2015, 2:36 [place] Message  Patient Details  Name: Carlos Black MRN: [id number] Date of Birth: 1951-08-28   Medicare Important Message Given:  Yes-second notification given    Shelda Altes 09/22/2015, 2:36 PM

## 2015-09-22 NOTE — Progress Notes (Signed)
IP PROGRESS NOTE  Subjective:   He appears slightly more lethargic today. His daughter is at the bedside.  Objective: Vital signs in last 24 hours: Blood pressure 136/67, pulse 97, temperature 100.2 F (37.9 C), temperature source Oral, resp. rate 24, height 5\' 5"  (1.651 m), weight 169 lb 1 oz (76.686 kg), SpO2 98 %.  Intake/Output from previous day: 10/27 0701 - 10/28 0700 In: 1577.3 [P.O.:1000; I.V.:577.3] Out: 700 [Urine:700]  Physical Exam:  HEENT: No thrush Lungs: Clear anteriorly Cardiac: Regular rate and rhythm Abdomen: No hepatomegaly, no mass, distended Extremities: No leg edema Neurologic: Alert and oriented, follows commands, 3/5 strength of the left arm and hand, minimal movement of the left leg and foot Skin: Healing incision at the left groin, ecchymoses at the low abdominal wall    Lab Results:  Recent Labs  09/20/15 0640  WBC 9.4  HGB 8.2*  HCT 25.9*  PLT 252    BMET  Recent Labs  09/20/15 0640 09/21/15 0940  NA 136 137  K 4.0 4.0  CL 106 110  CO2 20* 18*  GLUCOSE 157* 158*  BUN 30* 30*  CREATININE 4.80* 5.35*  CALCIUM 7.6* 7.6*    Studies/Results: Ct Head Wo Contrast  09/20/2015  CLINICAL DATA:  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 Confusion. EXAM: CT HEAD WITHOUT CONTRAST TECHNIQUE: Contiguous axial images were obtained from the base of the skull through the vertex without contrast. COMPARISON:  MRI 09/06/2015 and head CT 09/05/2015 FINDINGS: No evidence for acute hemorrhage, mass lesion, midline shift, hydrocephalus or large infarct. Subtle low-density in the periventricular white matter appears unchanged. There is an old lacune infarct in the anterior limb of the right internal capsule. Visualized paranasal sinuses and mastoid air cells are clear. No acute bone abnormality. No suspicious bony lesions. IMPRESSION: No acute intracranial abnormality. Subtle white matter changes suggest chronic  small vessel ischemic disease. Old lacune in the right internal capsule. Electronically Signed   By: Markus Daft M.D.   On: 09/20/2015 15:58   US Renal  09/20/2015  CLINICAL DATA:  Acute kidney injury. EXAM: RENAL / URINARY TRACT ULTRASOUND COMPLETE COMPARISON:  CT 09/07/2015, PET CT 05/21/2015 FINDINGS: Right Kidney: Length: 10.7 cm. Perinephric fluid is identified. No focal renal mass or hydronephrosis identified sonographically. Left Kidney: Length: 11.5 cm. Renal parenchyma is heterogeneous. The lower pole of the kidney appears more hypoechoic and mildly enlarged compared with the remainder of the kidney, consistent with lymphomatous involvement as identified on PET-CT. No hydronephrosis. Bladder: Foley catheter decompresses the bladder. IMPRESSION: 1. No hydronephrosis. 2. Ill-defined hypoechoic lower pole of the left kidney, consistent with lymphoma. Electronically Signed   By: Nolon Nations M.D.   On: 09/20/2015 16:33   Nm Pet Image Initial (pi) Skull Base To Thigh  09/20/2015  CLINICAL DATA:  Initial treatment strategy for high-grade B-cell non-Hodgkin's lymphoma. EXAM: NUCLEAR MEDICINE PET SKULL BASE TO THIGH TECHNIQUE: 8.0 mCi F-18 FDG was injected intravenously. Full-ring PET imaging was performed from the skull base to thigh after the radiotracer. CT data was obtained and used for attenuation correction and anatomic localization. FASTING BLOOD GLUCOSE:  Value: 125 mg/dl COMPARISON:  Multiple exams, including 09/07/2015 FINDINGS: NECK No hypermetabolic lymph nodes in the neck. CHEST Pleural-based right upper lobe mass posteriorly measures 2.6 by 1.2 cm on image 14 series 6, and has maximum standard uptake value 14.3. Right hilar lymph node measures approximately 2 cm in short axis and has maximum standard uptake value  19.3. Left infrahilar node is difficult to measure but has maximum standard uptake value of 6.4. Right lower lobe posterolateral pleural-based nodule measures INSERT flank and has  maximum standard uptake value 5.3. Moderate size left pleural effusion has faintly increased activity on PET-CT. Coronary atherosclerosis. Small posterior paraspinal lymph node along the anterior margin of the left eleventh rib measures 0.8 cm in short axis on image 93 series 4 and has maximum standard uptake value 11.1. ABDOMEN/PELVIS Hypermetabolic bilateral adrenal masses with extensive abnormal hypermetabolic soft tissue masses within along the left perirenal fascia, involving the left kidney lower pole, and tracking in the retroperitoneum as a large mass encasing part of the abdominal aorta and proximal iliac vasculature, incompletely encasing the left ureter. On image 137 series 4 this tumor measures 10.7 by 9.7 cm and may be invading the left psoas muscle. A double-J ureteral stent is in place. At the level of the loop of the double-J ureteral catheter, the dominant left retroperitoneal tumor has a maximum standard uptake value of 24.8. The left adrenal mass measures 5.1 by 4.1 cm on image 104 of series 4 and has a maximum standard uptake value of 25.7. The right adrenal mass measures 5.7 by 3.7 cm on image 105 series 4 and has a maximum standard uptake value of 18.5. Tumor tracking along the left pelvic sidewall has maximum standard uptake value of approximately 22.3. Oval-shaped soft tissue density along the left inguinal hernia measuring 3.0 by 2.0 cm on image 185 series 4 with maximum standard uptake value 23.6. I do not see a separate left testicle and I am suspicious that this may represent a retracted testicle with abnormally high metabolic activity unless the patient has had prior orchectomy. The right testicle seems to have normal activity level, although there is scrotal thickening as well as a right hydrocele within a right inguinal hernia. SKELETON Small focus of muscular activity along the gluteus medius muscle on the left, maximum standard uptake value 6.8. IMPRESSION: 1. Extensive tumor  involvement in the abdomen including a very large left retroperitoneal mass encasing the ureter and involving the left kidney and perirenal space; bilateral adrenal malignancy; carry renal tumor deposits on the left ; and either a tumor nodule or more likely tumor involving the left testicle in the left inguinal canal. In the chest, there are hypermetabolic pleural-based nodules as well as hypermetabolic adenopathy. Moderate size left pleural effusion has faintly increased activity on PET-CT and may be malignant. 2. Small focus of muscular activity in the left gluteus medius muscle. Probably malignant. 3. Coronary atherosclerosis. Electronically Signed   By: Van Clines M.D.   On: 09/20/2015 16:06    Medications: I have reviewed the patient's current medications.  Assessment/Plan:  1. Left retroperitoneal mass, adrenal masses, left testicular mass, status post a CT-guided biopsy of the left retroperitoneal mass 09/08/2015-pathology confirmed large B-cell lymphoma, CD20 positive  Staging PET scan 09/20/2015 with extensive lymphoma involving the abdomen, adrenal glands, and left kidney 2. Renal failure, left hydronephrosis; left ureter stent placed 09/18/2015. Progressive. 3. Left hemiplegia-chronic following a CVA 4. Microcytic anemia secondary to chronic disease, renal failure, surgery, and multiple phlebotomies 5. Fever-resolved on Decadron, Decadron discontinued 09/13/2015 6. Rash/pruritus 09/14/2015-? Contact dermatitis versus drug rash 7. Status post left orchiectomy and placement of a left ureter stent 09/18/2015. Pathology revealed involvement with large B-cell lymphoma.   He appears stable. The plan is to begin systemic therapy today. He will be treated with R-CHOP. The chemotherapy will be dose adjusted  secondary to his performance status, malnutrition, and renal failure. He will receive Rasburicase prophylaxis. I reviewed the potential toxicities associated with the CHOP  chemotherapy regimen including the chance for nausea/vomiting, mucositis, diarrhea, alopecia, and hematologic toxicity. We discussed the vesicant property of doxorubicin and vincristine. We discussed the cardiac toxicities associated with doxorubicin. We reviewed the potential side effects associated with rituximab including the chance of an allergic reaction, leukoencephalitis, and hematologic toxicity. He agrees to proceed.   I discussed the diagnosis, prognosis, and treatment plan at length with his daughter.  Recommendations: 1. Place PICC 2. Cycle 1 R-CHOP today 3. Daily chemistry panel and uric acid 4. Begin G-CSF 09/23/2015  Oncology will follow him daily   LOS: 3 days   [place], [place]  09/22/2015, 9:48 AM

## 2015-09-22 NOTE — Progress Notes (Signed)
Peripherally Inserted Central Catheter/Midline Placement  The IV Nurse has discussed with the patient and/or persons authorized to consent for the patient, the purpose of this procedure and the potential benefits and risks involved with this procedure.  The benefits include less needle sticks, lab draws from the catheter and patient may be discharged home with the catheter.  Risks include, but not limited to, infection, bleeding, blood clot (thrombus formation), and puncture of an artery; nerve damage and irregular heat beat.  Alternatives to this procedure were also discussed.  PICC/Midline Placement Documentation  PICC / Midline Double Lumen 13/08/65 PICC Right Basilic 42 cm 1 cm (Active)  Indication for Insertion or Continuance of Line Limited venous access - need for IV therapy >5 days (PICC only) 09/22/2015 11:00 AM  Exposed Catheter (cm) 1 cm 09/22/2015 11:00 AM  Dressing Change Due 09/29/15 09/22/2015 11:00 AM       Jule Economy Horton 09/22/2015, 11:20 AM

## 2015-09-22 NOTE — Telephone Encounter (Signed)
Wife left message re call she received about a 10/29 appointment due to patient in [place]. S/w desk nurse and per desk nurse cxd 10/29 inj. No further appointments at this time. Left message informing wife.

## 2015-09-23 ENCOUNTER — Ambulatory Visit: Payer: Medicare HMO

## 2015-09-23 DIAGNOSIS — C8593 Non-Hodgkin lymphoma, unspecified, intra-abdominal lymph nodes: Secondary | ICD-10-CM

## 2015-09-23 DIAGNOSIS — Z5189 Encounter for other specified aftercare: Secondary | ICD-10-CM

## 2015-09-23 DIAGNOSIS — Z09 Encounter for follow-up examination after completed treatment for conditions other than malignant neoplasm: Secondary | ICD-10-CM | POA: Insufficient documentation

## 2015-09-23 LAB — CBC
HEMATOCRIT: 23.9 % — AB (ref 39.0–52.0)
HEMOGLOBIN: 7.4 g/dL — AB (ref 13.0–17.0)
MCH: 24.1 pg — ABNORMAL LOW (ref 26.0–34.0)
MCHC: 31 g/dL (ref 30.0–36.0)
MCV: 77.9 fL — AB (ref 78.0–100.0)
PLATELETS: 235 10*3/uL (ref 150–400)
RBC: 3.07 MIL/uL — ABNORMAL LOW (ref 4.22–5.81)
RDW: 17.8 % — AB (ref 11.5–15.5)
WBC: 5.2 10*3/uL (ref 4.0–10.5)

## 2015-09-23 LAB — RENAL FUNCTION PANEL
ALBUMIN: 1.9 g/dL — AB (ref 3.5–5.0)
ANION GAP: 9 (ref 5–15)
BUN: 46 mg/dL — AB (ref 6–20)
CO2: 21 mmol/L — ABNORMAL LOW (ref 22–32)
Calcium: 7.7 mg/dL — ABNORMAL LOW (ref 8.9–10.3)
Chloride: 106 mmol/L (ref 101–111)
Creatinine, Ser: 5.82 mg/dL — ABNORMAL HIGH (ref 0.61–1.24)
GFR calc Af Amer: 11 mL/min — ABNORMAL LOW (ref >60)
GFR calc non Af Amer: 9 mL/min — ABNORMAL LOW (ref >60)
GLUCOSE: 321 mg/dL — AB (ref 65–99)
PHOSPHORUS: 6.1 mg/dL — AB (ref 2.5–4.6)
POTASSIUM: 4.8 mmol/L (ref 3.5–5.1)
Sodium: 136 mmol/L (ref 135–145)

## 2015-09-23 LAB — URIC ACID: Uric Acid, Serum: 2.7 mg/dL — ABNORMAL LOW (ref 4.4–7.6)

## 2015-09-23 MED ORDER — SEVELAMER CARBONATE 0.8 G PO PACK
0.8000 g | PACK | Freq: Three times a day (TID) | ORAL | Status: DC
  Filled 2015-09-23 (×3): qty 1

## 2015-09-23 MED ORDER — SEVELAMER CARBONATE 800 MG PO TABS
800.0000 mg | ORAL_TABLET | Freq: Three times a day (TID) | ORAL | Status: DC
  Administered 2015-09-23 – 2015-09-24 (×3): 800 mg via ORAL
  Filled 2015-09-23 (×6): qty 1

## 2015-09-23 NOTE — Progress Notes (Deleted)
HEMATOLOGY-ONCOLOGY CROSS-COVER PROGRESS NOTE  SUBJECTIVE: Tolerated cycle 1 R-CHOP quite well. No nausea issues, no infusion reactions. Awake and alert and talkative.  OBJECTIVE:Awake and laert PHYSICAL EXAMINATION: ECOG PERFORMANCE STATUS: 3-4  Filed Vitals:   09/23/15 0600  BP: 125/65  Pulse: 91  Temp: 98.8 F (37.1 C)  Resp: 18   Filed Weights   09/23/15 0207  Weight: 132 lb (59.875 kg)    GENERAL:alert SKIN: skin color, texture, turgor are normal, no rashes or significant lesions OROPHARYNX:no exudate, no erythema NECK: supple, thyroid normal size, non-tender LUNGS: clear to auscultation  HEART: regular rate & rhythm and no murmurs  ABDOMEN:Moderately distended, tympanic  NEURO: alert & oriented x 3 with fluent speech, chronic stroke changes  LABORATORY DATA:  I have reviewed the data as listed CMP Latest Ref Rng 09/23/2015 09/22/2015 09/22/2015  Glucose 65 - 99 mg/dL 146(H) 94 123  BUN 6 - 20 mg/dL 21(H) 21(H) 18.4  Creatinine 0.61 - 1.24 mg/dL 0.66 0.66 0.7  Sodium 135 - 145 mmol/L 143 146(H) 147(H)  Potassium 3.5 - 5.1 mmol/L 3.8 3.5 3.9  Chloride 101 - 111 mmol/L 100(L) 100(L) -  CO2 22 - 32 mmol/L 37(H) 42(H) 39(H)  Calcium 8.9 - 10.3 mg/dL 8.5(L) 8.7(L) 9.0  Total Protein 6.5 - 8.1 g/dL 6.2(L) 6.7 -  Total Bilirubin 0.3 - 1.2 mg/dL 0.4 <0.30 -  Alkaline Phos 38 - 126 U/L 53 63 -  AST 15 - 41 U/L 15 17 -  ALT 17 - 63 U/L 14(L) 15 -    Lab Results  Component Value Date   WBC 3.8* 09/23/2015   HGB 8.3* 09/23/2015   HCT 26.4* 09/23/2015   MCV 100.0 09/23/2015   PLT 200 09/23/2015   NEUTROABS 2.8 09/22/2015    ASSESSMENT AND PLAN: 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 Tolerated treatment quite well. Based on notes, it appears that he is lot more awake and alert today. 2. ARF: Being monitored closely for tumor lysis. He got Rasburicase Yday; Appreciate Nephrology assistance. 3. Anemia: Hb 8.3, Monitoring Neupogen to be started and continued until  past his nadir counts (approx 7-10 days) Will follow daily. Thanks   

## 2015-09-23 NOTE — Progress Notes (Signed)
[facility] PROGRESS NOTE    Progress Note   Carlos Black YJE:[id number] DOB: 05-02-1951 DOA: 09/12/2015 PCP: [name], MD   Brief Narrative:   Carlos Black is an 64 y.o. male with a history of CVA and left residual weakness, diabetes mellitus type 2, chronic kidney disease stage III recently diagnosed with high-grade B-cell lymphoma which he has not started chemotherapy with ongoing fevers since last admission came back to the oncology office for chemotherapy teaching was found to have fever with worsening renal function  Assessment/Plan:   AKI (acute kidney injury) ([place]): - Baseline creatinine 1.9-2.3, on admission 3.3,  Appreciate renal's assistance. - He made about a liter of urine today, which is improved from yesterday, renal was consulted and recommended to change his fluid to D5 with bicarbonate to help with his metabolic derangement - Renal ultrasound showed no hydronephrosis and ill-defined hypoechoic mass in the left lower pole of the kidney likely lymphoma. - Insert PICC line on 09/14/2015. - We'll have to continue to monitor serum electrolytes closely as he is at risk of tumor lysis syndrome. - Continue monitor electrolytes and Uric acid closely.  High-grade B-cell aggressive lymphoma: Fever likely due to high-grade lymphoma. Appreciate oncology's assistance. Oncology on board and chemotherapy R-CHOP was started on 09/22/2015.  Mild hypoxia: Mildly hypoxic on admission, now resolved.  History of CVA (cerebrovascular accident) Continue aspirin.  Sacral decubitus pressure Ulcer: Consult wound care.  Fever: Likely due to lymphoma, infectious workup has been negative.  No fevers overnight.    DVT Prophylaxis - Lovenox ordered.  Family Communication: none Disposition Plan: Home when stable. Code Status:     Code Status Orders        Start     Ordered   09/12/2015 1440  Full code   Continuous     09/07/2015 1439        IV  Access:    Peripheral IV   Procedures and diagnostic studies:   No results found.   Medical Consultants:    None.  Anti-Infectives:   Anti-infectives    None      Subjective:    [name] he relates he feels about the same, not having any further pains. Anxious to start chemotherapy.  Objective:    Filed Vitals:   09/22/15 1810 09/22/15 1900 09/22/15 2028 09/23/15 0432  BP: 114/65 127/65 111/68 107/73  Pulse: 100 97 90 76  Temp: 97.4 F (36.3 C) 97.7 F (36.5 C) 97.9 F (36.6 C) 97.4 F (36.3 C)  TempSrc: Oral Oral Oral Oral  Resp: 28 30 26 20   Height:      Weight:      SpO2: 92% 93% 93% 96%    Intake/Output Summary (Last 24 hours) at 09/23/15 0849 Last data filed at 09/23/15 0502  Gross per 24 hour  Intake 1989.17 ml  Output    950 ml  Net 1039.17 ml   Filed Weights   09/23/2015 1327  Weight: 76.686 kg (169 lb 1 oz)    Exam: Gen:  NAD,  Cardiovascular:  RRR, No M/R/G Chest and lungs:   CTAB Abdomen:  Abdomen soft, mildly distended but not tender, + BS Extremities:  No C/E/C   Data Reviewed:    Labs: Basic Metabolic Panel:  Recent Labs Lab 09/18/15 1050 09/20/15 0640 09/21/15 0940 09/22/15 1018 09/23/15 0440  NA 136 136 137 136 136  K 4.5 4.0 4.0 4.5 4.8  CL 103 106 110 108 106  CO2 23 20* 18* 17*  21*  GLUCOSE 184* 157* 158* 158* 321*  BUN 29* 30* 30* 34* 46*  CREATININE 3.35* 4.80* 5.35* 5.77* 5.82*  CALCIUM 8.5* 7.6* 7.6* 7.9* 7.7*  PHOS  --   --   --   --  6.1*   GFR Estimated Creatinine Clearance: 11.9 mL/min (by C-G formula based on Cr of 5.82). Liver Function Tests:  Recent Labs Lab 09/20/15 0640 09/22/15 1018 09/23/15 0440  AST 37 39  --   ALT 14* 10*  --   ALKPHOS 114 122  --   BILITOT 0.8 0.3  --   PROT 5.4* 5.6*  --   ALBUMIN 1.9* 2.3* 1.9*   No results for input(s): LIPASE, AMYLASE in the last 168 hours. No results for input(s): AMMONIA in the last 168 hours. Coagulation profile No results for  input(s): INR, PROTIME in the last 168 hours.  CBC:  Recent Labs Lab 09/18/15 1050 09/20/15 0640 09/23/15 0440  WBC 7.6 9.4 5.2  HGB 9.3* 8.2* 7.4*  HCT 29.4* 25.9* 23.9*  MCV 79.5 80.2 77.9*  PLT 237 252 235   Cardiac Enzymes: No results for input(s): CKTOTAL, CKMB, CKMBINDEX, TROPONINI in the last 168 hours. BNP (last 3 results) No results for input(s): PROBNP in the last 8760 hours. CBG:  Recent Labs Lab 08/28/2015 1510 09/12/2015 1626 09/20/15 0736 09/20/15 1216 09/20/15 1832  GLUCAP 150* 181* 153* 134* 166*   D-Dimer: No results for input(s): DDIMER in the last 72 hours. Hgb A1c: No results for input(s): HGBA1C in the last 72 hours. Lipid Profile: No results for input(s): CHOL, HDL, LDLCALC, TRIG, CHOLHDL, LDLDIRECT in the last 72 hours. Thyroid function studies: No results for input(s): TSH, T4TOTAL, T3FREE, THYROIDAB in the last 72 hours.  Invalid input(s): FREET3 Anemia work up: No results for input(s): VITAMINB12, FOLATE, FERRITIN, TIBC, IRON, RETICCTPCT in the last 72 hours. Sepsis Labs:  Recent Labs Lab 09/18/15 1050 09/18/2015 1419 09/01/2015 1825 09/20/15 0640 09/23/15 0440  WBC 7.6  --   --  9.4 5.2  LATICACIDVEN  --  1.7 2.2*  --   --    Microbiology Recent Results (from the past 240 hour(s))  MRSA PCR Screening     Status: None   Collection Time: 08/28/2015 12:26 PM  Result Value Ref Range Status   MRSA by PCR NEGATIVE NEGATIVE Final    Comment:        The GeneXpert MRSA Assay (FDA approved for NASAL specimens only), is one component of a comprehensive MRSA colonization surveillance program. It is not intended to diagnose MRSA infection nor to guide or monitor treatment for MRSA infections.   Culture, blood (routine x 2)     Status: None (Preliminary result)   Collection Time: 09/21/15  8:40 AM  Result Value Ref Range Status   Specimen Description BLOOD RIGHT HAND  Final   Special Requests BOTTLES DRAWN AEROBIC AND ANAEROBIC 10ML  Final    Culture   Final    NO GROWTH 1 DAY Performed at [facility]    Report Status PENDING  Incomplete  Culture, blood (routine x 2)     Status: None (Preliminary result)   Collection Time: 09/21/15  8:50 AM  Result Value Ref Range Status   Specimen Description BLOOD RIGHT HAND  Final   Special Requests BOTTLES DRAWN AEROBIC ONLY 10ML  Final   Culture   Final    NO GROWTH 1 DAY Performed at [facility]    Report Status PENDING  Incomplete  Medications:   . aspirin  81 mg Oral Daily  . enoxaparin (LOVENOX) injection  30 mg Subcutaneous Q24H  . feeding supplement (GLUCERNA SHAKE)  237 mL Oral TID BM  . pantoprazole  40 mg Oral QHS  . predniSONE  80 mg Oral Q breakfast  . sevelamer carbonate  0.8 g Oral TID WC  . sodium chloride  10-40 mL Intracatheter Q12H  . Tbo-Filgrastim  300 mcg Subcutaneous q1800   Continuous Infusions: . dextrose 5 % 850 mL with sodium bicarbonate 150 mEq infusion 50 mL/hr at 09/22/15 1315    Time spent: 25 min   LOS: 4 days   Charlynne Cousins  [facility] Pager [phone]  *Please refer to [email or web address], password [email or web address] to get updated schedule on who will round on this patient, as hospitalists switch teams weekly. If 7PM-7AM, please contact night-coverage at [email or web address], password [email or web address] for any overnight needs.  09/23/2015, 8:49 AM

## 2015-09-23 NOTE — Progress Notes (Signed)
HEMATOLOGY-ONCOLOGY CROSS-COVER PROGRESS NOTE SUBJECTIVE: Tolerated cycle 1 R-CHOP quite well. No nausea issues, no infusion reactions. Awake and alert and talkative.  OBJECTIVE:Awake  PHYSICAL EXAMINATION: ECOG PERFORMANCE STATUS: 3 - Symptomatic, >50% confined to bed  Filed Vitals:   09/23/15 0432  BP: 107/73  Pulse: 76  Temp: 97.4 F (36.3 C)  Resp: 20   Filed Weights   09/24/2015 1327  Weight: 169 lb 1 oz (76.686 kg)    GENERAL:alert and answers questions appropriately. SKIN: skin color, texture, turgor are normal, no rashes or significant lesions OROPHARYNX:no exudate, no erythema NECK: supple, thyroid normal size, non-tender, without nodularity LUNGS: clear to auscultation HEART: regular rate & rhythm and no murmurs and no lower extremity edema ABDOMEN:distended NEURO: alert & oriented x 3 with fluent speech, prior stroke  LABORATORY DATA:  I have reviewed the data as listed CMP Latest Ref Rng 09/23/2015 09/22/2015 09/21/2015  Glucose 65 - 99 mg/dL 321(H) 158(H) 158(H)  BUN 6 - 20 mg/dL 46(H) 34(H) 30(H)  Creatinine 0.61 - 1.24 mg/dL 5.82(H) 5.77(H) 5.35(H)  Sodium 135 - 145 mmol/L 136 136 137  Potassium 3.5 - 5.1 mmol/L 4.8 4.5 4.0  Chloride 101 - 111 mmol/L 106 108 110  CO2 22 - 32 mmol/L 21(L) 17(L) 18(L)  Calcium 8.9 - 10.3 mg/dL 7.7(L) 7.9(L) 7.6(L)  Total Protein 6.5 - 8.1 g/dL - 5.6(L) -  Total Bilirubin 0.3 - 1.2 mg/dL - 0.3 -  Alkaline Phos 38 - 126 U/L - 122 -  AST 15 - 41 U/L - 39 -  ALT 17 - 63 U/L - 10(L) -    Lab Results  Component Value Date   WBC 5.2 09/23/2015   HGB 7.4* 09/23/2015   HCT 23.9* 09/23/2015   MCV 77.9* 09/23/2015   PLT 235 09/23/2015   NEUTROABS 2.4 09/07/2015    ASSESSMENT AND PLAN: 1. Testicular DLBCL: S/P cycle 1 RCHOP yday. Tolerated it very well. Monitoring closely for tumor lysis 2. Neupogen to start and continue through nadir counts.(approx 7-10 days) 3. ARF: Being watched closely by nephrology. Appreciate  nephrology help 4. Anemia: If Hb decreases below 7, give blood Will follow daily

## 2015-09-23 NOTE — Progress Notes (Signed)
[facility] Rounding Note  Subjective:  Pt received chemo yesterday  Cycle 1 of R-CHOP  Also rec'd rasburicase Renal function only minimally worse than yesterday Phosphorus 6.1/uric acid 1.9 Made about 1 liter of urine/24 hours  He is INCREDIBLY more alert today -  talking, feeding himself!!! We talked about his kidney issues  Objective Vital signs in last 24 hours: Filed Vitals:   09/22/15 1810 09/22/15 1900 09/22/15 2028 09/23/15 0432  BP: 114/65 127/65 111/68 107/73  Pulse: 100 97 90 76  Temp: 97.4 F (36.3 C) 97.7 F (36.5 C) 97.9 F (36.6 C) 97.4 F (36.3 C)  TempSrc: Oral Oral Oral Oral  Resp: 28 30 26 20   Height:      Weight:      SpO2: 92% 93% 93% 96%   Weight change:   Intake/Output Summary (Last 24 hours) at 09/23/15 0814 Last data filed at 09/23/15 0502  Gross per 24 hour  Intake 1989.17 ml  Output    950 ml  Net 1039.17 ml    Physical examination: BP 107/73 mmHg  Pulse 76  Temp(Src) 97.4 F (36.3 C) (Oral)  Resp 20  Ht 5\' 5"  (1.651 m)  Wt 76.686 kg (169 lb 1 oz)  BMI 28.13 kg/m2  SpO2 96%   General: Awake, alert, conversational, and recalls conversations he had with Dr. Benay Spice late last week Neck: no jugular venous distention  Heart: S1S2 No S3 Mo murmur Lungs: poor effort, decreased breath sounds at the bases but clear anteriorly. Somewhat hyperpneic. Abdomen: distended, tender to deep palpation positive bowel sounds  Extremities: Trace to 1+ pitting edema Feet in boots Skin: warm and dry  Neuro: Awake and alert - marked improvement in MS   Labs: Basic Metabolic Panel:  Recent Labs Lab 09/18/15 1050 09/20/15 0640 09/21/15 0940 09/22/15 1018 09/23/15 0440  NA 136 136 137 136 136  K 4.5 4.0 4.0 4.5 4.8  CL 103 106 110 108 106  CO2 23 20* 18* 17* 21*  GLUCOSE 184* 157* 158* 158* 321*  BUN 29* 30* 30* 34* 46*  CREATININE 3.35* 4.80* 5.35* 5.77* 5.82*  CALCIUM 8.5* 7.6* 7.6* 7.9* 7.7*  PHOS  --   --   --    --  6.1*    Results for Carlos Black, Carlos Black (MRN [id number]) as of 09/23/2015 08:11  Ref. Range 09/22/2015 10:18 09/23/2015 04:40  Uric Acid, Serum Latest Ref Range: 4.4-7.6 mg/dL 10.0 (H) 2.7 (L)     Recent Labs Lab 09/20/15 0640 09/22/15 1018 09/23/15 0440  AST 37 39  --   ALT 14* 10*  --   ALKPHOS 114 122  --   BILITOT 0.8 0.3  --   PROT 5.4* 5.6*  --   ALBUMIN 1.9* 2.3* 1.9*     Recent Labs Lab 09/18/15 1050 09/20/15 0640 09/23/15 0440  WBC 7.6 9.4 5.2  HGB 9.3* 8.2* 7.4*  HCT 29.4* 25.9* 23.9*  MCV 79.5 80.2 77.9*  PLT 237 252 235     Recent Labs Lab 08/30/2015 1510 09/15/2015 1626 09/20/15 0736 09/20/15 1216 09/20/15 1832  GLUCAP 150* 181* 153* 134* 166*     Medications: . dextrose 5 % 850 mL with sodium bicarbonate 150 mEq infusion 50 mL/hr at 09/22/15 1315   . aspirin  81 mg Oral Daily  . enoxaparin (LOVENOX) injection  30 mg Subcutaneous Q24H  . feeding supplement (GLUCERNA SHAKE)  237 mL Oral TID BM  . pantoprazole  40 mg Oral QHS  . predniSONE  80 mg Oral Q breakfast  . sodium chloride  10-40 mL Intracatheter Q12H  . 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   By way of review he had AKI on CKD early October (10/11) with peak creatinine at that time 3.4, improved with discontinuation of ARB and correction of hypotension to 1.99 on 10/20.  Creatinine worsened back to 3.35 on 10/24 despite JJ stent placement on left due to large RP mass obstructing left ureter (no residual hydronephrosis on f/u imaging) with the thought that he may have lymphomatous involvement of both kidneys. We were asked to see because of worsening renal function on 10/27  with creatinine at that time up to 5.35.   1. Acute renal failure (on CKD3) -   Most recent imaging negative for obstruction (following JJ stent placement 10/24) for left hydro d/t large RP mass. Nonoliguric with foley. No identifiable recent nephrotoxins. 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 Have considered possibility that the patient was hypotensive on admission 10/24 and has ATN that could plateau and improve - in this vein, his creatinine is only minimally worse today than yesterday and his UOP has picked up just a bit.  He remains without absolute indications for dialysis.  He is s/p 1st cycle of CHOP-R (10/28) without evidence of tumor lysis syndrome (phos elevation is as expected for degree of renal failure - will add some Renvela powder with meals, uric acid low post rasburicase). Acid base looks better on the isotonic sodium bicarbonate drip - continue at 50 cc/hour.  2. Anemia - Malignancy + CKD. Management per oncology  3. B-cell lymphoma, CD20+ with left retroperitoneal, adrenal, testicular and renal masses; left hydro requiring JJ stent 10/24; left radical orchiectomy 10/24- s/p Cycle 1 CHOP-R 10/28. Dr. Benay Spice managing. 4. Fevers - source ?Marland Kitchen Possibly tumor related? 5. H/O stroke with residual eft body weakness 6. Encephalopathy - MARKED improvement in MS past 24 hours   Jamal Maes, MD [place] pager 09/23/2015, 8:14 AM

## 2015-09-24 DIAGNOSIS — E119 Type 2 diabetes mellitus without complications: Secondary | ICD-10-CM

## 2015-09-24 LAB — GLUCOSE, CAPILLARY
GLUCOSE-CAPILLARY: 565 mg/dL — AB (ref 65–99)
GLUCOSE-CAPILLARY: 537 mg/dL — AB (ref 65–99)
GLUCOSE-CAPILLARY: 498 mg/dL — AB (ref 65–99)
GLUCOSE-CAPILLARY: 431 mg/dL — AB (ref 65–99)
GLUCOSE-CAPILLARY: 221 mg/dL — AB (ref 65–99)
GLUCOSE-CAPILLARY: 190 mg/dL — AB (ref 65–99)
Glucose-Capillary: 343 mg/dL — ABNORMAL HIGH (ref 65–99)
Glucose-Capillary: 268 mg/dL — ABNORMAL HIGH (ref 65–99)
Glucose-Capillary: 144 mg/dL — ABNORMAL HIGH (ref 65–99)
Glucose-Capillary: 107 mg/dL — ABNORMAL HIGH (ref 65–99)

## 2015-09-24 LAB — RENAL FUNCTION PANEL
ALBUMIN: 1.9 g/dL — AB (ref 3.5–5.0)
ANION GAP: 10 (ref 5–15)
BUN: 66 mg/dL — ABNORMAL HIGH (ref 6–20)
CALCIUM: 7.3 mg/dL — AB (ref 8.9–10.3)
CO2: 23 mmol/L (ref 22–32)
Chloride: 99 mmol/L — ABNORMAL LOW (ref 101–111)
Creatinine, Ser: 5.59 mg/dL — ABNORMAL HIGH (ref 0.61–1.24)
GFR, EST AFRICAN AMERICAN: 11 mL/min — AB (ref >60)
GFR, EST NON AFRICAN AMERICAN: 10 mL/min — AB (ref >60)
Glucose, Bld: 593 mg/dL (ref 65–99)
PHOSPHORUS: 7.2 mg/dL — AB (ref 2.5–4.6)
Potassium: 4.5 mmol/L (ref 3.5–5.1)
SODIUM: 132 mmol/L — AB (ref 135–145)

## 2015-09-24 LAB — BASIC METABOLIC PANEL
Anion gap: 12 (ref 5–15)
BUN: 67 mg/dL — AB (ref 6–20)
CHLORIDE: 96 mmol/L — AB (ref 101–111)
CO2: 25 mmol/L (ref 22–32)
CREATININE: 5.57 mg/dL — AB (ref 0.61–1.24)
Calcium: 7 mg/dL — ABNORMAL LOW (ref 8.9–10.3)
GFR calc Af Amer: 11 mL/min — ABNORMAL LOW (ref >60)
GFR calc non Af Amer: 10 mL/min — ABNORMAL LOW (ref >60)
GLUCOSE: 718 mg/dL — AB (ref 65–99)
POTASSIUM: 4.2 mmol/L (ref 3.5–5.1)
Sodium: 133 mmol/L — ABNORMAL LOW (ref 135–145)

## 2015-09-24 LAB — CBC WITH DIFFERENTIAL/PLATELET
Basophils Absolute: 0 10*3/uL (ref 0.0–0.1)
Basophils Relative: 0 %
EOS ABS: 0 10*3/uL (ref 0.0–0.7)
EOS PCT: 0 %
HCT: 23.4 % — ABNORMAL LOW (ref 39.0–52.0)
Hemoglobin: 7.4 g/dL — ABNORMAL LOW (ref 13.0–17.0)
LYMPHS ABS: 0.2 10*3/uL — AB (ref 0.7–4.0)
LYMPHS PCT: 1 %
MCH: 24.6 pg — AB (ref 26.0–34.0)
MCHC: 31.6 g/dL (ref 30.0–36.0)
MCV: 77.7 fL — AB (ref 78.0–100.0)
MONO ABS: 0.3 10*3/uL (ref 0.1–1.0)
Monocytes Relative: 1 %
Neutro Abs: 22.7 10*3/uL — ABNORMAL HIGH (ref 1.7–7.7)
Neutrophils Relative %: 98 %
PLATELETS: 260 10*3/uL (ref 150–400)
RBC: 3.01 MIL/uL — AB (ref 4.22–5.81)
RDW: 17.6 % — AB (ref 11.5–15.5)
WBC: 23.2 10*3/uL — AB (ref 4.0–10.5)

## 2015-09-24 LAB — URIC ACID: Uric Acid, Serum: 3.1 mg/dL — ABNORMAL LOW (ref 4.4–7.6)

## 2015-09-24 MED ORDER — INSULIN ASPART 100 UNIT/ML ~~LOC~~ SOLN
0.0000 [IU] | Freq: Three times a day (TID) | SUBCUTANEOUS | Status: DC
  Administered 2015-09-24: 9 [IU] via SUBCUTANEOUS

## 2015-09-24 MED ORDER — SODIUM CHLORIDE 0.9 % IV SOLN
INTRAVENOUS | Status: DC
  Administered 2015-09-24: 5.4 [IU]/h via INTRAVENOUS
  Filled 2015-09-24: qty 2.5

## 2015-09-24 MED ORDER — DEXTROSE 50 % IV SOLN: 25.0000 mL | INTRAVENOUS | Status: DC | PRN

## 2015-09-24 MED ORDER — INSULIN DETEMIR 100 UNIT/ML ~~LOC~~ SOLN: 10.0000 [IU] | Freq: Every day | SUBCUTANEOUS | Status: DC

## 2015-09-24 MED ORDER — INSULIN DETEMIR 100 UNIT/ML ~~LOC~~ SOLN
5.0000 [IU] | Freq: Two times a day (BID) | SUBCUTANEOUS | Status: DC
  Administered 2015-09-24: 5 [IU] via SUBCUTANEOUS
  Filled 2015-09-24: qty 0.05

## 2015-09-24 MED ORDER — SODIUM CHLORIDE 0.45 % IV SOLN: INTRAVENOUS | Status: DC

## 2015-09-24 MED ORDER — INSULIN ASPART 100 UNIT/ML ~~LOC~~ SOLN
3.0000 [IU] | Freq: Three times a day (TID) | SUBCUTANEOUS | Status: DC
  Administered 2015-09-24: 3 [IU] via SUBCUTANEOUS

## 2015-09-24 MED ORDER — INSULIN REGULAR BOLUS VIA INFUSION
0.0000 [IU] | Freq: Three times a day (TID) | INTRAVENOUS | Status: DC
  Filled 2015-09-24: qty 10

## 2015-09-24 MED ORDER — STERILE WATER FOR INJECTION IV SOLN
INTRAVENOUS | Status: DC
  Administered 2015-09-24 – 2015-09-26 (×2): via INTRAVENOUS
  Filled 2015-09-24 (×3): qty 850

## 2015-09-24 MED ORDER — SEVELAMER CARBONATE 800 MG PO TABS
1600.0000 mg | ORAL_TABLET | Freq: Three times a day (TID) | ORAL | Status: DC
  Administered 2015-09-24 – 2015-09-26 (×7): 1600 mg via ORAL
  Filled 2015-09-24 (×9): qty 2

## 2015-09-24 NOTE — Progress Notes (Signed)
[facility] PROGRESS NOTE    Progress Note   Carlos Black JOA:[id number] DOB: [birth year] DOA: 08/28/2015 PCP: [name], MD   Brief Narrative:   Carlos Black is an 64 y.o. male with a history of CVA and left residual weakness, diabetes mellitus type 2, chronic kidney disease stage III recently diagnosed with high-grade B-cell lymphoma which he has not started chemotherapy with ongoing fevers since last admission came back to the oncology office for chemotherapy teaching was found to have fever with worsening renal function, started chop on 10.28.2016, renal consult as he is at high risk of tumor lysis syndrome.  Assessment/Plan:   AKI (acute kidney injury) ([place]): - Baseline creatinine 1.9-2.3, on admission 3.3,  Appreciate renal's assistance. - UOP is picking up made 2.2L of urine, appreciated renal assistance on this case. - PICC line placed on 09/14/2015, risk of tumor lysis syndrome. - Continue monitor electrolytes and Uric acid closely.  High-grade B-cell aggressive lymphoma: His Fever have resolved once chemo started.Marland Kitchen Appreciate oncology's assistance. Oncology on board, R-CHOP was started on 09/22/2015.  Mild hypoxia: Mildly hypoxic on admission, now resolved.  History of CVA (cerebrovascular accident) Continue aspirin.  Sacral decubitus pressure Ulcer: Consult wound care.  Fever: Likely due to lymphoma, infectious workup has been negative.  No fevers overnight.    DVT Prophylaxis - Lovenox ordered.  Family Communication: none Disposition Plan: Home when stable. Code Status:     Code Status Orders        Start     Ordered   09/17/2015 1440  Full code   Continuous     09/01/2015 1439        IV Access:    Peripheral IV   Procedures and diagnostic studies:   No results found.   Medical Consultants:    None.  Anti-Infectives:   Anti-infectives    None      Subjective:    Carlos Black  No compalins    Objective:    Filed Vitals:   09/23/15 0432 09/23/15 1310 09/23/15 2040 09/24/15 0512  BP: 107/73 112/69 119/70 110/73  Pulse: 76 78 86 92  Temp: 97.4 F (36.3 C) 98 F (36.7 C) 97.6 F (36.4 C)   TempSrc: Oral Oral Oral Oral  Resp: 20 18 18 18   Height:      Weight:      SpO2: 96% 97% 96% 95%    Intake/Output Summary (Last 24 hours) at 09/24/15 0809 Last data filed at 09/24/15 0758  Gross per 24 hour  Intake 2577.5 ml  Output   2200 ml  Net  377.5 ml   Filed Weights   09/01/2015 1327  Weight: 76.686 kg (169 lb 1 oz)    Exam: Gen:  NAD,  Cardiovascular:  RRR, No M/R/G Chest and lungs:   CTAB Abdomen:  Abdomen soft,  not tender, + BS Extremities:  No C/E/C   Data Reviewed:    Labs: Basic Metabolic Panel:  Recent Labs Lab 09/18/15 1050 09/20/15 0640 09/21/15 0940 09/22/15 1018 09/23/15 0440  NA 136 136 137 136 136  K 4.5 4.0 4.0 4.5 4.8  CL 103 106 110 108 106  CO2 23 20* 18* 17* 21*  GLUCOSE 184* 157* 158* 158* 321*  BUN 29* 30* 30* 34* 46*  CREATININE 3.35* 4.80* 5.35* 5.77* 5.82*  CALCIUM 8.5* 7.6* 7.6* 7.9* 7.7*  PHOS  --   --   --   --  6.1*   GFR Estimated Creatinine Clearance: 11.9 mL/min (by C-G  formula based on Cr of 5.82). Liver Function Tests:  Recent Labs Lab 09/20/15 0640 09/22/15 1018 09/23/15 0440  AST 37 39  --   ALT 14* 10*  --   ALKPHOS 114 122  --   BILITOT 0.8 0.3  --   PROT 5.4* 5.6*  --   ALBUMIN 1.9* 2.3* 1.9*   No results for input(s): LIPASE, AMYLASE in the last 168 hours. No results for input(s): AMMONIA in the last 168 hours. Coagulation profile No results for input(s): INR, PROTIME in the last 168 hours.  CBC:  Recent Labs Lab 09/18/15 1050 09/20/15 0640 09/23/15 0440  WBC 7.6 9.4 5.2  HGB 9.3* 8.2* 7.4*  HCT 29.4* 25.9* 23.9*  MCV 79.5 80.2 77.9*  PLT 237 252 235   Cardiac Enzymes: No results for input(s): CKTOTAL, CKMB, CKMBINDEX, TROPONINI in the last 168 hours. BNP (last 3 results) No  results for input(s): PROBNP in the last 8760 hours. CBG:  Recent Labs Lab 09/18/2015 1510 08/26/2015 1626 09/20/15 0736 09/20/15 1216 09/20/15 1832  GLUCAP 150* 181* 153* 134* 166*   D-Dimer: No results for input(s): DDIMER in the last 72 hours. Hgb A1c: No results for input(s): HGBA1C in the last 72 hours. Lipid Profile: No results for input(s): CHOL, HDL, LDLCALC, TRIG, CHOLHDL, LDLDIRECT in the last 72 hours. Thyroid function studies: No results for input(s): TSH, T4TOTAL, T3FREE, THYROIDAB in the last 72 hours.  Invalid input(s): FREET3 Anemia work up: No results for input(s): VITAMINB12, FOLATE, FERRITIN, TIBC, IRON, RETICCTPCT in the last 72 hours. Sepsis Labs:  Recent Labs Lab 09/18/15 1050 08/30/2015 1419 09/04/2015 1825 09/20/15 0640 09/23/15 0440  WBC 7.6  --   --  9.4 5.2  LATICACIDVEN  --  1.7 2.2*  --   --    Microbiology Recent Results (from the past 240 hour(s))  MRSA PCR Screening     Status: None   Collection Time: 09/18/2015 12:26 PM  Result Value Ref Range Status   MRSA by PCR NEGATIVE NEGATIVE Final    Comment:        The GeneXpert MRSA Assay (FDA approved for NASAL specimens only), is one component of a comprehensive MRSA colonization surveillance program. It is not intended to diagnose MRSA infection nor to guide or monitor treatment for MRSA infections.   Culture, blood (routine x 2)     Status: None (Preliminary result)   Collection Time: 09/21/15  8:40 AM  Result Value Ref Range Status   Specimen Description BLOOD RIGHT HAND  Final   Special Requests BOTTLES DRAWN AEROBIC AND ANAEROBIC 10ML  Final   Culture   Final    NO GROWTH 2 DAYS Performed at [facility]    Report Status PENDING  Incomplete  Culture, blood (routine x 2)     Status: None (Preliminary result)   Collection Time: 09/21/15  8:50 AM  Result Value Ref Range Status   Specimen Description BLOOD RIGHT HAND  Final   Special Requests BOTTLES DRAWN AEROBIC ONLY 10ML   Final   Culture   Final    NO GROWTH 2 DAYS Performed at [facility]    Report Status PENDING  Incomplete     Medications:   . aspirin  81 mg Oral Daily  . enoxaparin (LOVENOX) injection  30 mg Subcutaneous Q24H  . feeding supplement (GLUCERNA SHAKE)  237 mL Oral TID BM  . pantoprazole  40 mg Oral QHS  . predniSONE  80 mg Oral Q breakfast  .  sevelamer carbonate  800 mg Oral TID WC  . sodium chloride  10-40 mL Intracatheter Q12H  . Tbo-Filgrastim  300 mcg Subcutaneous q1800   Continuous Infusions: . dextrose 5 % 850 mL with sodium bicarbonate 150 mEq infusion 50 mL/hr at 09/24/15 0408    Time spent: 25 min   LOS: 5 days   Charlynne Cousins  [facility] Pager [phone]  *Please refer to [email or web address], password [email or web address] to get updated schedule on who will round on this patient, as hospitalists switch teams weekly. If 7PM-7AM, please contact night-coverage at [email or web address], password [email or web address] for any overnight needs.  09/24/2015, 8:09 AM

## 2015-09-24 NOTE — Progress Notes (Signed)
CRITICAL VALUE ALERT  Critical value received:  Glucose 718  Date of notification:  09/24/2015   Time of notification:  12:58 PM   Critical value read back:Yes.    Nurse who received alert:  Joaquin Courts   MD notified (1st page):  Dr Aileen Fass  Time of first page:  12:58 PM   MD notified (2nd page):  Time of second page:  Responding MD:  Aileen Fass  Time MD responded:  1300

## 2015-09-24 NOTE — Progress Notes (Signed)
[facility] Rounding Note  EVENTS S/p Cycle 1 of R-CHOP 09/22/15 Also rec'd rasburicase Renal function has not worsened and in fact a little better than yesterday and with 2.2 liters of UOP past 24 hours  Uric acid is fine, phos is up a little more (ordered sevelamer powder as phos binder yesterday - apparently pharmacy only carries the tablet) Remains on isotonic bicarb at 50 cc/hour Blood sugars >500  Subjective:  He is alert today - a little "slower" than yesterday - but conversant On the bedpan Says "they got the pan to me in time this time" Denies SOB or nausea  Objective Vital signs in last 24 hours: Filed Vitals:   09/23/15 0432 09/23/15 1310 09/23/15 2040 09/24/15 0512  BP: 107/73 112/69 119/70 110/73  Pulse: 76 78 86 92  Temp: 97.4 F (36.3 C) 98 F (36.7 C) 97.6 F (36.4 C)   TempSrc: Oral Oral Oral Oral  Resp: 20 18 18 18   Height:      Weight:      SpO2: 96% 97% 96% 95%    I/O last 3 completed shifts: In: 3596.7 [P.O.:1560; I.V.:2036.7] Out: 2500 [Urine:2500] Total I/O In: 10 [I.V.:10] Out: -    Physical examination: BP 110/73 mmHg  Pulse 92  Temp(Src) 97.6 F (36.4 C) (Oral)  Resp 18  Ht 5\' 5"  (1.651 m)  Wt 76.686 kg (169 lb 1 oz)  BMI 28.13 kg/m2  SpO2 95%   General: Awake, alert, conversational - a little less animated than yesterday but clear Neck: No JVD Heart: S1S2 No S3 No murmur Lungs: Anteriorly clear. No increased WOB. Abdomen: Protuberant, not really tender today Extremities: Trace to 1+ pitting edema  Feet in boots Skin: warm and dry  Neuro: Awake and alert    Labs: Basic Metabolic Panel:  Recent Labs Lab 09/18/15 1050 09/20/15 0640 09/21/15 0940 09/22/15 1018 09/23/15 0440 09/24/15 0550  NA 136 136 137 136 136 132*  K 4.5 4.0 4.0 4.5 4.8 4.5  CL 103 106 110 108 106 99*  CO2 23 20* 18* 17* 21* 23  GLUCOSE 184* 157* 158* 158* 321* 593*  BUN 29* 30* 30* 34* 46* 66*  CREATININE 3.35* 4.80* 5.35*  5.77* 5.82* 5.59*  CALCIUM 8.5* 7.6* 7.6* 7.9* 7.7* 7.3*  PHOS  --   --   --   --  6.1* 7.2*      Ref. Range 09/22/2015 10:18 09/23/2015 04:40 09/24/2015 05:50  Uric Acid, Serum Latest Ref Range: 4.4-7.6 mg/dL 10.0 (H) 2.7 (L) 3.1 (L)     Recent Labs Lab 09/20/15 0640 09/22/15 1018 09/23/15 0440 09/24/15 0550  AST 37 39  --   --   ALT 14* 10*  --   --   ALKPHOS 114 122  --   --   BILITOT 0.8 0.3  --   --   PROT 5.4* 5.6*  --   --   ALBUMIN 1.9* 2.3* 1.9* 1.9*     Recent Labs Lab 09/18/15 1050 09/20/15 0640 09/23/15 0440 09/24/15 0818  WBC 7.6 9.4 5.2 23.2*  NEUTROABS  --   --   --  22.7*  HGB 9.3* 8.2* 7.4* 7.4*  HCT 29.4* 25.9* 23.9* 23.4*  MCV 79.5 80.2 77.9* 77.7*  PLT 237 252 235 260     Recent Labs Lab 09/20/15 0736 09/20/15 1216 09/20/15 1832 09/24/15 0843 09/24/15 1203  GLUCAP 153* 134* 166* 565* >600*     Medications: . sodium chloride 0.45 % 1,000 mL with  sodium bicarbonate 150 mEq infusion     . aspirin  81 mg Oral Daily  . enoxaparin (LOVENOX) injection  30 mg Subcutaneous Q24H  . feeding supplement (GLUCERNA SHAKE)  237 mL Oral TID BM  . insulin aspart  0-9 Units Subcutaneous TID WC  . insulin aspart  3 Units Subcutaneous TID WC  . insulin detemir  5 Units Subcutaneous BID  . pantoprazole  40 mg Oral QHS  . predniSONE  80 mg Oral Q breakfast  . sevelamer carbonate  800 mg Oral TID WC  . sodium chloride  10-40 mL Intracatheter Q12H  . 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   By way of review he had AKI on CKD early October (10/11) with peak creatinine at that time 3.4, improved with discontinuation of ARB and correction of  hypotension to 1.99 on 10/20.  Creatinine worsened back to 3.35 on 10/24 despite JJ stent placement on left due to large RP mass obstructing left ureter (no residual hydronephrosis on f/u imaging) with the thought that he may have lymphomatous involvement of both kidneys. We were asked to see because of worsening renal function on 10/27 with creatinine at that time up to 5.35.   1. Acute renal failure (on CKD3) -   Most recent imaging negative for obstruction (following JJ stent placement 10/24) for left hydro d/t large RP mass. Nonoliguric with foley. No identifiable recent nephrotoxins. 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 Have considered possibility that the patient was hypotensive on admission 10/24 and has ATN that could plateau and improve - in this vein, his creatinine appears to have reached plateau,  UOP has picked even more over past 24 hours.  He remains without  indications for dialysis and hopefully is turning corner.  He is s/p 1st cycle of CHOP-R (10/28) without major evidence of tumor lysis syndrome (phos elevation is as expected for degree of renal failure -added sevelamer with meals, will increase to 1600 TIDw/meals),  uric acid low post rasburicase). Acid base looks better on the isotonic sodium bicarbonate drip - continue at 50 cc/hour but change to bicarb in sterile water (d/t sig hyperglycemia).  2. Anemia - Malignancy + CKD. Management per oncology  3. B-cell lymphoma, CD20+ with left retroperitoneal, adrenal, testicular and renal masses; left hydro requiring JJ stent 10/24; left radical orchiectomy 10/24- s/p Cycle 1 CHOP-R 10/28. Dr. Benay Spice managing. 4. Fevers - source ?Marland Kitchen Possibly tumor related? 5. H/O stroke with residual eft body weakness 6. Encephalopathy - MARKED improvement  7. DM with marked hyperglycemia - sugars >500. Changed IVF to eliminate dextrose.  Insulin per primary service.    Jamal Maes, MD [facility] [phone]  pager 09/24/2015, 12:15 PM

## 2015-09-24 NOTE — Progress Notes (Signed)
HEMATOLOGY-ONCOLOGY PROGRESS NOTE  SUBJECTIVE: Increase in blood sugars due to steroids. Awake and alert. Denies pain.  OBJECTIVE: PHYSICAL EXAMINATION: ECOG PERFORMANCE STATUS: 3 - Symptomatic, >50% confined to bed  Filed Vitals:   09/24/15 0512  BP: 110/73  Pulse: 92  Temp:   Resp: 18   Filed Weights   09/12/2015 1327  Weight: 169 lb 1 oz (76.686 kg)    GENERAL:alert, no distress and comfortable LUNGS: clear to auscultation and percussion with normal breathing effort HEART: regular rate & rhythm and no murmurs and no lower extremity edema ABDOMEN:slightly distended Musculoskeletal:no cyanosis of digits and no clubbing  NEURO: alert & oriented x 3 with fluent speech, no focal motor/sensory deficits  LABORATORY DATA:  I have reviewed the data as listed CMP Latest Ref Rng 09/23/2015 09/22/2015 09/21/2015  Glucose 65 - 99 mg/dL 321(H) 158(H) 158(H)  BUN 6 - 20 mg/dL 46(H) 34(H) 30(H)  Creatinine 0.61 - 1.24 mg/dL 5.82(H) 5.77(H) 5.35(H)  Sodium 135 - 145 mmol/L 136 136 137  Potassium 3.5 - 5.1 mmol/L 4.8 4.5 4.0  Chloride 101 - 111 mmol/L 106 108 110  CO2 22 - 32 mmol/L 21(L) 17(L) 18(L)  Calcium 8.9 - 10.3 mg/dL 7.7(L) 7.9(L) 7.6(L)  Total Protein 6.5 - 8.1 g/dL - 5.6(L) -  Total Bilirubin 0.3 - 1.2 mg/dL - 0.3 -  Alkaline Phos 38 - 126 U/L - 122 -  AST 15 - 41 U/L - 39 -  ALT 17 - 63 U/L - 10(L) -    Lab Results  Component Value Date   WBC 5.2 09/23/2015   HGB 7.4* 09/23/2015   HCT 23.9* 09/23/2015   MCV 77.9* 09/23/2015   PLT 235 09/23/2015   NEUTROABS 2.4 09/07/2015    ASSESSMENT AND PLAN: 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 Monitoring closely for tumor lysis 2. On Neupogen  3. ARF: Being watched closely by nephrology. 4. Anemia: If Hb decreases below 7, give blood; No CBCD available. Will order it. 5. Diabetes Mellitus Type 2: Made worse by steroids. He will need his sugars brought under control. Nurses plan to recheck his sugars. Dr.Sherill  will follow tomorrow.

## 2015-09-25 DIAGNOSIS — N19 Unspecified kidney failure: Secondary | ICD-10-CM

## 2015-09-25 DIAGNOSIS — C8330 Diffuse large B-cell lymphoma, unspecified site: Secondary | ICD-10-CM

## 2015-09-25 DIAGNOSIS — Z5111 Encounter for antineoplastic chemotherapy: Secondary | ICD-10-CM

## 2015-09-25 DIAGNOSIS — D649 Anemia, unspecified: Secondary | ICD-10-CM

## 2015-09-25 LAB — CBC WITH DIFFERENTIAL/PLATELET
BASOS ABS: 0 10*3/uL (ref 0.0–0.1)
BASOS PCT: 0 %
EOS ABS: 0 10*3/uL (ref 0.0–0.7)
Eosinophils Relative: 0 %
HCT: 21.6 % — ABNORMAL LOW (ref 39.0–52.0)
Hemoglobin: 7.2 g/dL — ABNORMAL LOW (ref 13.0–17.0)
Lymphocytes Relative: 1 %
Lymphs Abs: 0.2 10*3/uL — ABNORMAL LOW (ref 0.7–4.0)
MCH: 25.1 pg — AB (ref 26.0–34.0)
MCHC: 33.3 g/dL (ref 30.0–36.0)
MCV: 75.3 fL — ABNORMAL LOW (ref 78.0–100.0)
MONO ABS: 0.2 10*3/uL (ref 0.1–1.0)
Monocytes Relative: 1 %
NEUTROS ABS: 22.6 10*3/uL — AB (ref 1.7–7.7)
NEUTROS PCT: 98 %
PLATELETS: 301 10*3/uL (ref 150–400)
RBC: 2.87 MIL/uL — ABNORMAL LOW (ref 4.22–5.81)
RDW: 17.2 % — ABNORMAL HIGH (ref 11.5–15.5)
WBC Morphology: INCREASED
WBC: 23 10*3/uL — ABNORMAL HIGH (ref 4.0–10.5)

## 2015-09-25 LAB — COMPREHENSIVE METABOLIC PANEL
ALT: 7 U/L — ABNORMAL LOW (ref 17–63)
ANION GAP: 12 (ref 5–15)
AST: 18 U/L (ref 15–41)
Albumin: 2 g/dL — ABNORMAL LOW (ref 3.5–5.0)
Alkaline Phosphatase: 107 U/L (ref 38–126)
BUN: 75 mg/dL — ABNORMAL HIGH (ref 6–20)
CHLORIDE: 100 mmol/L — AB (ref 101–111)
CO2: 27 mmol/L (ref 22–32)
Calcium: 7.4 mg/dL — ABNORMAL LOW (ref 8.9–10.3)
Creatinine, Ser: 5.12 mg/dL — ABNORMAL HIGH (ref 0.61–1.24)
GFR calc non Af Amer: 11 mL/min — ABNORMAL LOW (ref >60)
GFR, EST AFRICAN AMERICAN: 12 mL/min — AB (ref >60)
Glucose, Bld: 185 mg/dL — ABNORMAL HIGH (ref 65–99)
POTASSIUM: 3.9 mmol/L (ref 3.5–5.1)
SODIUM: 139 mmol/L (ref 135–145)
Total Bilirubin: 0.7 mg/dL (ref 0.3–1.2)
Total Protein: 5.2 g/dL — ABNORMAL LOW (ref 6.5–8.1)

## 2015-09-25 LAB — GLUCOSE, CAPILLARY
GLUCOSE-CAPILLARY: 108 mg/dL — AB (ref 65–99)
GLUCOSE-CAPILLARY: 125 mg/dL — AB (ref 65–99)
GLUCOSE-CAPILLARY: 198 mg/dL — AB (ref 65–99)
GLUCOSE-CAPILLARY: 211 mg/dL — AB (ref 65–99)
GLUCOSE-CAPILLARY: 275 mg/dL — AB (ref 65–99)
GLUCOSE-CAPILLARY: 91 mg/dL (ref 65–99)
Glucose-Capillary: 85 mg/dL (ref 65–99)
Glucose-Capillary: 95 mg/dL (ref 65–99)
Glucose-Capillary: 110 mg/dL — ABNORMAL HIGH (ref 65–99)
Glucose-Capillary: 317 mg/dL — ABNORMAL HIGH (ref 65–99)

## 2015-09-25 LAB — HEMOGLOBIN A1C
HEMOGLOBIN A1C: 6.8 % — AB (ref 4.8–5.6)
MEAN PLASMA GLUCOSE: 148 mg/dL

## 2015-09-25 LAB — PHOSPHORUS: PHOSPHORUS: 7 mg/dL — AB (ref 2.5–4.6)

## 2015-09-25 LAB — LACTATE DEHYDROGENASE: LDH: 277 U/L — AB (ref 98–192)

## 2015-09-25 LAB — URIC ACID: Uric Acid, Serum: 3.7 mg/dL — ABNORMAL LOW (ref 4.4–7.6)

## 2015-09-25 MED ORDER — INSULIN ASPART 100 UNIT/ML ~~LOC~~ SOLN: 0.0000 [IU] | Freq: Three times a day (TID) | SUBCUTANEOUS | Status: DC

## 2015-09-25 MED ORDER — INSULIN GLARGINE 100 UNIT/ML ~~LOC~~ SOLN
10.0000 [IU] | Freq: Every day | SUBCUTANEOUS | Status: DC
  Administered 2015-09-25: 10 [IU] via SUBCUTANEOUS
  Filled 2015-09-25: qty 0.1

## 2015-09-25 MED ORDER — INSULIN ASPART 100 UNIT/ML ~~LOC~~ SOLN
3.0000 [IU] | Freq: Three times a day (TID) | SUBCUTANEOUS | Status: DC
  Administered 2015-09-25 – 2015-09-26 (×5): 3 [IU] via SUBCUTANEOUS

## 2015-09-25 MED ORDER — INSULIN ASPART 100 UNIT/ML ~~LOC~~ SOLN
0.0000 [IU] | Freq: Every day | SUBCUTANEOUS | Status: DC
  Administered 2015-09-27: 3 [IU] via SUBCUTANEOUS
  Administered 2015-09-29: 5 [IU] via SUBCUTANEOUS
  Administered 2015-09-30: 2 [IU] via SUBCUTANEOUS

## 2015-09-25 MED ORDER — INSULIN ASPART 100 UNIT/ML ~~LOC~~ SOLN: 0.0000 [IU] | Freq: Every day | SUBCUTANEOUS | Status: DC

## 2015-09-25 MED ORDER — INSULIN ASPART 100 UNIT/ML ~~LOC~~ SOLN
0.0000 [IU] | Freq: Three times a day (TID) | SUBCUTANEOUS | Status: DC
  Administered 2015-09-25: 11 [IU] via SUBCUTANEOUS
  Administered 2015-09-25: 5 [IU] via SUBCUTANEOUS
  Administered 2015-09-25: 8 [IU] via SUBCUTANEOUS
  Administered 2015-09-26 (×3): 3 [IU] via SUBCUTANEOUS
  Administered 2015-09-27: 11 [IU] via SUBCUTANEOUS
  Administered 2015-09-27 – 2015-09-28 (×3): 3 [IU] via SUBCUTANEOUS
  Administered 2015-09-29 – 2015-09-30 (×3): 5 [IU] via SUBCUTANEOUS
  Administered 2015-09-30: 11 [IU] via SUBCUTANEOUS
  Administered 2015-09-30: 8 [IU] via SUBCUTANEOUS

## 2015-09-25 MED ORDER — ENSURE ENLIVE PO LIQD: 237.0000 mL | Freq: Two times a day (BID) | ORAL | Status: DC

## 2015-09-25 MED ORDER — INSULIN GLARGINE 100 UNIT/ML ~~LOC~~ SOLN
7.0000 [IU] | Freq: Two times a day (BID) | SUBCUTANEOUS | Status: DC
  Filled 2015-09-25: qty 0.07

## 2015-09-25 MED ORDER — INSULIN GLARGINE 100 UNIT/ML ~~LOC~~ SOLN
20.0000 [IU] | Freq: Two times a day (BID) | SUBCUTANEOUS | Status: DC
  Administered 2015-09-25 – 2015-09-26 (×4): 20 [IU] via SUBCUTANEOUS
  Filled 2015-09-25 (×5): qty 0.2

## 2015-09-25 NOTE — Progress Notes (Signed)
CSW continuing to follow.   Pt admitted from [facility] and plan is for pt to return when medically stable.  Pt not yet medically ready for discharge.  CSW contacted pt daughter, Lynelle Smoke today to provide support. Pt daughter discussed that she anticipates that pt will be in the hospital a number of more days.   CSW updated [facility] and updated facility. [facility] states that facility will contact [employer] to send clinicals as pt will require auth from [facility] before returning to SNF.   CSW to continue to follow to provide support and assist with pt disposition needs.   Alison Murray, MSW, [place] [facility] [phone]

## 2015-09-25 NOTE — Progress Notes (Signed)
Carlos Black Progress Note   Subjective: no complaints  Filed Vitals:   09/24/15 1400 09/24/15 1900 09/25/15 0507 09/25/15 1400  BP: 112/67 123/76 125/71 125/63  Pulse: 91 80 77 83  Temp: 98.1 F (36.7 C) 97 F (36.1 C) 97.4 F (36.3 C) 97.6 F (36.4 C)  TempSrc: Oral Oral Oral Oral  Resp: 18 20 20 18   Height:      Weight:      SpO2: 96% 96% 92% 94%   Exam: Tired, quiet adult male in no distress, quite debiliatated No jvd Chest clear bilat RRR no MRG Abd soft ntnd no ascites or hsm GU normal male Mild bilat UE edema 1+ pitting bilat LE edema Neuro left hemipar, sluggish responses  UA 10/25 red, large Hb, mod LE, 1.014, 5.5, rare epi, 21-50 wbc, tntc rbc's  Renal US no hydro, ill-defined hypoechoic lower pole of left kidney , c/w lymphoma PET scan - large left RP mass encasing the ureter and involving L kidney and perirenal space, bilat adrenal malig, tumor nodule involving left testicle. Hypermetabolic pleural-based nodules and adenopathy, moderate left effusion. CAD.       Assessment: 1 Acute on CKD IV- baseline creat 1.9- 2.3. Creat improving, down to 5.12 (peak 5.8). Suspect lymphomatous disease of the kidneys contributing, vs ATN from hypotension on admission.  2 B-cell lymphoma - extensive disease in abdomen, adrenals, left kidney, L testicle. S/p L orchiectomy 10/24. R-CHOP chemo started on 10/20.  3 Left hydro s/p JJ stent 4 DM2 5 Hx CVA w left hemipar  Plan - cont supportive care   Kelly Splinter MD [facility] pager [phone]    cell [phone] 09/25/2015, 2:54 PM    Recent Labs Lab 09/23/15 0440 09/24/15 0550 09/24/15 1215 09/25/15 0600  NA 136 132* 133* 139  K 4.8 4.5 4.2 3.9  CL 106 99* 96* 100*  CO2 21* 23 25 27   GLUCOSE 321* 593* 718* 185*  BUN 46* 66* 67* 75*  CREATININE 5.82* 5.59* 5.57* 5.12*  CALCIUM 7.7* 7.3* 7.0* 7.4*  PHOS 6.1* 7.2*  --  7.0*    Recent Labs Lab 09/20/15 0640 09/22/15 1018  09/23/15 0440 09/24/15 0550 09/25/15 0600  AST 37 39  --   --  18  ALT 14* 10*  --   --  7*  ALKPHOS 114 122  --   --  107  BILITOT 0.8 0.3  --   --  0.7  PROT 5.4* 5.6*  --   --  5.2*  ALBUMIN 1.9* 2.3* 1.9* 1.9* 2.0*    Recent Labs Lab 09/23/15 0440 09/24/15 0818 09/25/15 0600  WBC 5.2 23.2* 23.0*  NEUTROABS  --  22.7* 22.6*  HGB 7.4* 7.4* 7.2*  HCT 23.9* 23.4* 21.6*  MCV 77.9* 77.7* 75.3*  PLT 235 260 301   . aspirin  81 mg Oral Daily  . enoxaparin (LOVENOX) injection  30 mg Subcutaneous Q24H  . feeding supplement (GLUCERNA SHAKE)  237 mL Oral TID BM  . insulin aspart  0-15 Units Subcutaneous TID WC  . insulin aspart  0-5 Units Subcutaneous QHS  . insulin aspart  3 Units Subcutaneous TID WC  . insulin glargine  20 Units Subcutaneous BID  . pantoprazole  40 mg Oral QHS  . predniSONE  80 mg Oral Q breakfast  . sevelamer carbonate  1,600 mg Oral TID WC  . sodium chloride  10-40 mL Intracatheter Q12H  . Tbo-Filgrastim  300 mcg Subcutaneous q1800   .  sodium bicarbonate 150 mEq in sterile water 1000 mL infusion 50 mL/hr at 09/24/15 1344   sodium chloride, acetaminophen **OR** acetaminophen, albuterol, alteplase, dextrose, diphenhydrAMINE, diphenhydrAMINE, EPINEPHrine, EPINEPHrine, EPINEPHrine, EPINEPHrine, famotidine, fludeoxyglucose F - 18, heparin lock flush, heparin lock flush, methylPREDNISolone sodium succinate, ondansetron **OR** ondansetron (ZOFRAN) IV, oxyCODONE, polyethylene glycol, sodium chloride, sodium chloride, sodium chloride

## 2015-09-25 NOTE — Progress Notes (Addendum)
[facility] PROGRESS NOTE    Progress Note   Carlos Black FIE:[id number] DOB: [birth year] DOA: 09/04/2015 PCP: [name], MD   Brief Narrative:   Carlos Black is an 64 y.o. male with a history of CVA and left residual weakness, diabetes mellitus type 2, chronic kidney disease stage III recently diagnosed with high-grade B-cell lymphoma which he has not started chemotherapy with ongoing fevers since last admission came back to the oncology office for chemotherapy teaching was found to have fever with worsening renal function, started chop on 10.28.2016, renal consult as he is at high risk of tumor lysis syndrome.  Assessment/Plan:   AKI (acute kidney injury) ([place]): - Baseline creatinine 1.9-2.3, on admission 3.3. - Continues to have good urine output, Creatinine is improving. - PICC line placed on 09/14/2015. - There is mild increase in uric acid, currently has no joint pain.  High-grade B-cell aggressive lymphoma: His Fever have resolved once chemo started.Marland Kitchen Appreciate oncology's assistance. Oncology on board, R-CHOP was started on 09/22/2015. Having mucositis. One BM overnight.  Mild hypoxia: Resolved.  History of CVA (cerebrovascular accident) Continue aspirin.  Sacral decubitus pressure Ulcer: Consult wound care.  Fever: Likely due to lymphoma, infectious workup has been negative.  No fevers overnight.  Leukocytosis: Likely due to steroids.  Hyperglycemia: Check an A1c he was started on steroids for his chemotherapy, his blood glucose went up to 770 had to be started on insulin drip now his blood glucoses well controlled on Lantus plus sliding scale. Continue steroids.    DVT Prophylaxis - Lovenox ordered.  Family Communication: none Disposition Plan: Home when stable. Code Status:     Code Status Orders        Start     Ordered   09/23/2015 1440  Full code   Continuous     09/21/2015 1439        IV Access:    Peripheral  IV   Procedures and diagnostic studies:   No results found.   Medical Consultants:    None.  Anti-Infectives:   Anti-infectives    None      Subjective:    Carlos Black  No compalins  Objective:    Filed Vitals:   09/24/15 0512 09/24/15 1400 09/24/15 1900 09/25/15 0507  BP: 110/73 112/67 123/76 125/71  Pulse: 92 91 80 77  Temp:  98.1 F (36.7 C) 97 F (36.1 C) 97.4 F (36.3 C)  TempSrc: Oral Oral Oral Oral  Resp: 18 18 20 20   Height:      Weight:      SpO2: 95% 96% 96% 92%    Intake/Output Summary (Last 24 hours) at 09/25/15 0829 Last data filed at 09/25/15 0804  Gross per 24 hour  Intake 2023.91 ml  Output   2150 ml  Net -126.09 ml   Filed Weights   08/30/2015 1327  Weight: 76.686 kg (169 lb 1 oz)    Exam: Gen:  NAD,  Cardiovascular:  RRR, No M/R/G Chest and lungs:   CTAB Abdomen:  Abdomen soft,  not tender, + BS Extremities:  No C/E/C   Data Reviewed:    Labs: Basic Metabolic Panel:  Recent Labs Lab 09/22/15 1018 09/23/15 0440 09/24/15 0550 09/24/15 1215 09/25/15 0600  NA 136 136 132* 133* 139  K 4.5 4.8 4.5 4.2 3.9  CL 108 106 99* 96* 100*  CO2 17* 21* 23 25 27   GLUCOSE 158* 321* 593* 718* 185*  BUN 34* 46* 66* 67* 75*  CREATININE 5.77* 5.82*  5.59* 5.57* 5.12*  CALCIUM 7.9* 7.7* 7.3* 7.0* 7.4*  PHOS  --  6.1* 7.2*  --  7.0*   GFR Estimated Creatinine Clearance: 13.6 mL/min (by C-G formula based on Cr of 5.12). Liver Function Tests:  Recent Labs Lab 09/20/15 0640 09/22/15 1018 09/23/15 0440 09/24/15 0550 09/25/15 0600  AST 37 39  --   --  18  ALT 14* 10*  --   --  7*  ALKPHOS 114 122  --   --  107  BILITOT 0.8 0.3  --   --  0.7  PROT 5.4* 5.6*  --   --  5.2*  ALBUMIN 1.9* 2.3* 1.9* 1.9* 2.0*   No results for input(s): LIPASE, AMYLASE in the last 168 hours. No results for input(s): AMMONIA in the last 168 hours. Coagulation profile No results for input(s): INR, PROTIME in the last 168 hours.  CBC:  Recent  Labs Lab 09/18/15 1050 09/20/15 0640 09/23/15 0440 09/24/15 0818 09/25/15 0600  WBC 7.6 9.4 5.2 23.2* 23.0*  NEUTROABS  --   --   --  22.7* 22.6*  HGB 9.3* 8.2* 7.4* 7.4* 7.2*  HCT 29.4* 25.9* 23.9* 23.4* 21.6*  MCV 79.5 80.2 77.9* 77.7* 75.3*  PLT 237 252 235 260 301   Cardiac Enzymes: No results for input(s): CKTOTAL, CKMB, CKMBINDEX, TROPONINI in the last 168 hours. BNP (last 3 results) No results for input(s): PROBNP in the last 8760 hours. CBG:  Recent Labs Lab 09/25/15 0138 09/25/15 0241 09/25/15 0346 09/25/15 0410 09/25/15 0809  GLUCAP 85 95 110* 108* 211*   D-Dimer: No results for input(s): DDIMER in the last 72 hours. Hgb A1c: No results for input(s): HGBA1C in the last 72 hours. Lipid Profile: No results for input(s): CHOL, HDL, LDLCALC, TRIG, CHOLHDL, LDLDIRECT in the last 72 hours. Thyroid function studies: No results for input(s): TSH, T4TOTAL, T3FREE, THYROIDAB in the last 72 hours.  Invalid input(s): FREET3 Anemia work up: No results for input(s): VITAMINB12, FOLATE, FERRITIN, TIBC, IRON, RETICCTPCT in the last 72 hours. Sepsis Labs:  Recent Labs Lab 09/18/2015 1419 09/17/2015 1825 09/20/15 0640 09/23/15 0440 09/24/15 0818 09/25/15 0600  WBC  --   --  9.4 5.2 23.2* 23.0*  LATICACIDVEN 1.7 2.2*  --   --   --   --    Microbiology Recent Results (from the past 240 hour(s))  MRSA PCR Screening     Status: None   Collection Time: 08/30/2015 12:26 PM  Result Value Ref Range Status   MRSA by PCR NEGATIVE NEGATIVE Final    Comment:        The GeneXpert MRSA Assay (FDA approved for NASAL specimens only), is one component of a comprehensive MRSA colonization surveillance program. It is not intended to diagnose MRSA infection nor to guide or monitor treatment for MRSA infections.   Culture, blood (routine x 2)     Status: None (Preliminary result)   Collection Time: 09/21/15  8:40 AM  Result Value Ref Range Status   Specimen Description BLOOD  RIGHT HAND  Final   Special Requests BOTTLES DRAWN AEROBIC AND ANAEROBIC 10ML  Final   Culture   Final    NO GROWTH 4 DAYS Performed at [facility]    Report Status PENDING  Incomplete  Culture, blood (routine x 2)     Status: None (Preliminary result)   Collection Time: 09/21/15  8:50 AM  Result Value Ref Range Status   Specimen Description BLOOD RIGHT HAND  Final  Special Requests BOTTLES DRAWN AEROBIC ONLY 10ML  Final   Culture   Final    NO GROWTH 4 DAYS Performed at [facility]    Report Status PENDING  Incomplete     Medications:   . aspirin  81 mg Oral Daily  . enoxaparin (LOVENOX) injection  30 mg Subcutaneous Q24H  . feeding supplement (GLUCERNA SHAKE)  237 mL Oral TID BM  . insulin aspart  0-15 Units Subcutaneous TID WC  . insulin aspart  0-5 Units Subcutaneous QHS  . insulin aspart  3 Units Subcutaneous TID WC  . insulin glargine  20 Units Subcutaneous BID  . pantoprazole  40 mg Oral QHS  . predniSONE  80 mg Oral Q breakfast  . sevelamer carbonate  1,600 mg Oral TID WC  . sodium chloride  10-40 mL Intracatheter Q12H  . Tbo-Filgrastim  300 mcg Subcutaneous q1800   Continuous Infusions: .  sodium bicarbonate 150 mEq in sterile water 1000 mL infusion 50 mL/hr at 09/24/15 1344    Time spent: 25 min   LOS: 6 days   Charlynne Cousins  [facility] Pager [phone]  *Please refer to [email or web address], password [email or web address] to get updated schedule on who will round on this patient, as hospitalists switch teams weekly. If 7PM-7AM, please contact night-coverage at [email or web address], password [email or web address] for any overnight needs.  09/25/2015, 8:29 AM

## 2015-09-25 NOTE — Progress Notes (Signed)
Physical Therapy Treatment Patient Details Name: Carlos Black MRN: [id number] DOB: Aug 23, 1951 Today's Date: 09/25/2015    History of Present Illness 64 yo male admitted with AKI. 10/24-cystoscopy, L ureteral stent, L radical orchiectomy. Hx of CVA with L residual weakness, DM, HTN, CAD.     PT Comments    Progressing slowly with mobility. Some improvement in activity tolerance compared to last session. Continues to require +2 assist. Hopefully will be able to get into standing over next couple of sessions. Recommend return to SNF.   Follow Up Recommendations  SNF     Equipment Recommendations  None recommended by PT    Recommendations for Other Services       Precautions / Restrictions Precautions Precautions: Fall Precaution Comments: left hemiparesis from previous CVA (wears L AFO) Restrictions Weight Bearing Restrictions: No    Mobility  Bed Mobility Overal bed mobility: Needs Assistance Bed Mobility: Supine to Sit;Sit to Supine     Supine to sit: Max assist;+2 for physical assistance;+2 for safety/equipment;HOB elevated Sit to supine: Max assist;+2 for physical assistance;+2 for safety/equipment;HOB elevated   General bed mobility comments: Assist for trunk and bil LEs. Pt reached out for bedrail with R UE and assisted. Utilized bedpad for scooting, positioning. Increased time.   Transfers                 General transfer comment: NT-worked on sitting balance  Ambulation/Gait                 Stairs            Wheelchair Mobility    Modified Rankin (Stroke Patients Only)       Balance Overall balance assessment: Needs assistance Sitting-balance support: Single extremity supported;Feet supported Sitting balance-Leahy Scale: Poor Sitting balance - Comments: Sat EOB at least 8 minutes with Min guard-Mod assist with 1 UE support. Min guard assist for 1-2 minutes on today. Pt able to perform LAQs with R LE x 20.  Postural control:  Posterior lean;Right lateral lean                          Cognition Arousal/Alertness: Awake/alert Behavior During Therapy: Flat affect Overall Cognitive Status: History of cognitive impairments - at baseline                      Exercises General Exercises - Lower Extremity Long Arc Quad: 20 reps;Seated;Right    General Comments        Pertinent Vitals/Pain Pain Assessment: Faces Faces Pain Scale: Hurts little more Pain Location: low back Pain Descriptors / Indicators: Sore Pain Intervention(s): Premedicated before session;Repositioned;Monitored during session;Limited activity within patient's tolerance    Home Living                      Prior Function            PT Goals (current goals can now be found in the care plan section) Progress towards PT goals: Progressing toward goals (slowly)    Frequency  Min 3X/week    PT Plan Current plan remains appropriate    Co-evaluation             End of Session   Activity Tolerance: Patient limited by fatigue Patient left: in bed;with call bell/phone within reach;with bed alarm set;with family/visitor present     Time: 8315-1761 PT Time Calculation (min) (ACUTE ONLY): 15 min  Charges:  $Therapeutic Activity:  8-22 mins                    G Codes:      Weston Anna, MPT Pager: [phone]

## 2015-09-25 NOTE — Progress Notes (Signed)
IP PROGRESS NOTE  Subjective:   He reports tolerating the chemotherapy well. No complaint.  Objective: Vital signs in last 24 hours: Blood pressure 125/71, pulse 77, temperature 97.4 F (36.3 C), temperature source Oral, resp. rate 20, height 5\' 5"  (1.651 m), weight 169 lb 1 oz (76.686 kg), SpO2 92 %.  Intake/Output from previous day: 10/30 0701 - 10/31 0700 In: 2023.9 [P.O.:720; I.V.:1303.9] Out: 2150 [Urine:2150]  Physical Exam:  HEENT: No thrush Lungs: Clear anteriorly Cardiac: Regular rate and rhythm Abdomen: No hepatomegaly, no mass, distended Extremities: No leg edema Neurologic: Alert and oriented, follows commands, 3/5 strength of the left arm and hand, minimal movement of the left leg and foot Skin: Healing incision at the left groin, ecchymoses at the low abdominal wall   right PICC without erythema   Lab Results:  Recent Labs  09/24/15 0818 09/25/15 0600  WBC 23.2* 23.0*  HGB 7.4* 7.2*  HCT 23.4* 21.6*  PLT 260 301    BMET  Recent Labs  09/24/15 1215 09/25/15 0600  NA 133* 139  K 4.2 3.9  CL 96* 100*  CO2 25 27  GLUCOSE 718* 185*  BUN 67* 75*  CREATININE 5.57* 5.12*  CALCIUM 7.0* 7.4*     Medications: I have reviewed the patient's current medications.  Assessment/Plan:  1. Left retroperitoneal mass, adrenal masses, left testicular mass, status post a CT-guided biopsy of the left retroperitoneal mass 09/08/2015-pathology confirmed large B-cell lymphoma, CD20 positive  Staging PET scan 09/20/2015 with extensive lymphoma involving the abdomen, adrenal glands, and left kidney  Cycle 1 R-CHOP 09/22/2015 2. Renal failure, left hydronephrosis; left ureter stent placed 09/18/2015.  3. Left hemiplegia-chronic following a CVA 4. Microcytic anemia secondary to chronic disease, renal failure, surgery, and multiple phlebotomies 5. Fever-resolved on Decadron, Decadron discontinued 09/13/2015 6. Rash/pruritus 09/14/2015-? Contact dermatitis versus  drug rash 7. Status post left orchiectomy and placement of a left ureter stent 09/18/2015. Pathology revealed involvement with large B-cell lymphoma.   He appears to have tolerated chemotherapy well. The renal function has stabilized and there is no evidence for tumor lysis syndrome.   Recommendations: 1. Continue gcsf 2. Management of renal failure per nephrology 3. PT/OT evaluation      LOS: 6 days   [place], [place]  09/25/2015, 2:28 PM

## 2015-09-25 NOTE — Progress Notes (Signed)
Patient's Insulin drip was discontinued at approximately 0407. CBG was 108. Had Lantus 10 units at approximately 0232

## 2015-09-26 DIAGNOSIS — D6489 Other specified anemias: Secondary | ICD-10-CM

## 2015-09-26 DIAGNOSIS — I69954 Hemiplegia and hemiparesis following unspecified cerebrovascular disease affecting left non-dominant side: Secondary | ICD-10-CM

## 2015-09-26 DIAGNOSIS — C8338 Diffuse large B-cell lymphoma, lymph nodes of multiple sites: Principal | ICD-10-CM

## 2015-09-26 LAB — COMPREHENSIVE METABOLIC PANEL
ALT: 6 U/L — ABNORMAL LOW (ref 17–63)
AST: 20 U/L (ref 15–41)
Albumin: 2 g/dL — ABNORMAL LOW (ref 3.5–5.0)
Alkaline Phosphatase: 101 U/L (ref 38–126)
Anion gap: 12 (ref 5–15)
BUN: 84 mg/dL — AB (ref 6–20)
CHLORIDE: 97 mmol/L — AB (ref 101–111)
CO2: 30 mmol/L (ref 22–32)
Calcium: 7.3 mg/dL — ABNORMAL LOW (ref 8.9–10.3)
Creatinine, Ser: 4.66 mg/dL — ABNORMAL HIGH (ref 0.61–1.24)
GFR calc Af Amer: 14 mL/min — ABNORMAL LOW (ref >60)
GFR, EST NON AFRICAN AMERICAN: 12 mL/min — AB (ref >60)
Glucose, Bld: 160 mg/dL — ABNORMAL HIGH (ref 65–99)
POTASSIUM: 3.9 mmol/L (ref 3.5–5.1)
SODIUM: 139 mmol/L (ref 135–145)
Total Bilirubin: 0.9 mg/dL (ref 0.3–1.2)
Total Protein: 5 g/dL — ABNORMAL LOW (ref 6.5–8.1)

## 2015-09-26 LAB — GLUCOSE, CAPILLARY
GLUCOSE-CAPILLARY: 164 mg/dL — AB (ref 65–99)
Glucose-Capillary: 179 mg/dL — ABNORMAL HIGH (ref 65–99)
Glucose-Capillary: 176 mg/dL — ABNORMAL HIGH (ref 65–99)
Glucose-Capillary: 159 mg/dL — ABNORMAL HIGH (ref 65–99)

## 2015-09-26 LAB — CULTURE, BLOOD (ROUTINE X 2)
Culture: NO GROWTH
Culture: NO GROWTH

## 2015-09-26 LAB — HEMOGLOBIN A1C
HEMOGLOBIN A1C: 7 % — AB (ref 4.8–5.6)
MEAN PLASMA GLUCOSE: 154 mg/dL

## 2015-09-26 LAB — LACTATE DEHYDROGENASE: LDH: 230 U/L — AB (ref 98–192)

## 2015-09-26 LAB — URIC ACID: URIC ACID, SERUM: 4.3 mg/dL — AB (ref 4.4–7.6)

## 2015-09-26 LAB — PHOSPHORUS: Phosphorus: 7 mg/dL — ABNORMAL HIGH (ref 2.5–4.6)

## 2015-09-26 MED ORDER — ALLOPURINOL 100 MG PO TABS
200.0000 mg | ORAL_TABLET | Freq: Every day | ORAL | Status: DC
  Administered 2015-09-26: 200 mg via ORAL
  Filled 2015-09-26: qty 2

## 2015-09-26 MED ORDER — ALLOPURINOL 300 MG PO TABS: 300.0000 mg | ORAL_TABLET | Freq: Every day | ORAL | Status: DC

## 2015-09-26 NOTE — Progress Notes (Signed)
[facility] Progress Note   Subjective: no complaints  Filed Vitals:   09/25/15 1559 09/25/15 2120 09/26/15 0501 09/26/15 1331  BP:  132/84 114/77 134/77  Pulse:  84 72 77  Temp:  97.5 F (36.4 C) 97.9 F (36.6 C) 97.7 F (36.5 C)  TempSrc:  Oral Oral Oral  Resp:  18 16 18   Height:      Weight: 87.907 kg (193 lb 12.8 oz)  88.27 kg (194 lb 9.6 oz)   SpO2:  98% 95% 93%   Exam: Tired, quiet adult male, debilitated No jvd Chest clear bilat RRR no MRG Abd soft ntnd no ascites or hsm GU normal male Mild bilat UE edema 1+ pitting bilat LE edema Neuro left hemipar, sluggish responses  UA 10/25 red, large Hb, mod LE, 1.014, 5.5, rare epi, 21-50 wbc, tntc rbc's  Renal US no hydro, ill-defined hypoechoic lower pole of left kidney , c/w lymphoma PET scan - large left RP mass encasing the ureter and involving L kidney and perirenal space, bilat adrenal malig, tumor nodule involving left testicle. Hypermetabolic pleural-based nodules and adenopathy, moderate left effusion. CAD.       Assessment: 1 Acute on CKD IV- baseline creat 1.9- 2.3. lymphomatous infiltration of kidneys and/or ATN from hypotension on admission. Recovery phase, creat down to 4.6 today, have dc'd IV bicarb gtt as weights are increasing 2 B-cell lymphoma - extensive disease in abdomen, adrenals, left kidney, L testicle. S/p L orchiectomy 10/24. R-CHOP chemo started on 10/20.  3 Left hydro s/p JJ stent 4 DM2 5 Hx CVA w left hemipar  Plan - no further suggestions. Will sign off.    Kelly Splinter MD [facility] pager [phone]    cell [phone] 09/26/2015, 2:13 PM    Recent Labs Lab 09/24/15 0550 09/24/15 1215 09/25/15 0600 09/26/15 0540  NA 132* 133* 139 139  K 4.5 4.2 3.9 3.9  CL 99* 96* 100* 97*  CO2 23 25 27 30   GLUCOSE 593* 718* 185* 160*  BUN 66* 67* 75* 84*  CREATININE 5.59* 5.57* 5.12* 4.66*  CALCIUM 7.3* 7.0* 7.4* 7.3*  PHOS 7.2*  --  7.0* 7.0*    Recent  Labs Lab 09/22/15 1018  09/24/15 0550 09/25/15 0600 09/26/15 0540  AST 39  --   --  18 20  ALT 10*  --   --  7* 6*  ALKPHOS 122  --   --  107 101  BILITOT 0.3  --   --  0.7 0.9  PROT 5.6*  --   --  5.2* 5.0*  ALBUMIN 2.3*  < > 1.9* 2.0* 2.0*  < > = values in this interval not displayed.  Recent Labs Lab 09/23/15 0440 09/24/15 0818 09/25/15 0600  WBC 5.2 23.2* 23.0*  NEUTROABS  --  22.7* 22.6*  HGB 7.4* 7.4* 7.2*  HCT 23.9* 23.4* 21.6*  MCV 77.9* 77.7* 75.3*  PLT 235 260 301   . allopurinol  200 mg Oral Daily  . aspirin  81 mg Oral Daily  . enoxaparin (LOVENOX) injection  30 mg Subcutaneous Q24H  . feeding supplement (GLUCERNA SHAKE)  237 mL Oral TID BM  . insulin aspart  0-15 Units Subcutaneous TID WC  . insulin aspart  0-5 Units Subcutaneous QHS  . insulin aspart  3 Units Subcutaneous TID WC  . insulin glargine  20 Units Subcutaneous BID  . pantoprazole  40 mg Oral QHS  . predniSONE  80 mg Oral Q breakfast  .  sevelamer carbonate  1,600 mg Oral TID WC  . sodium chloride  10-40 mL Intracatheter Q12H  . Tbo-Filgrastim  300 mcg Subcutaneous q1800   .  sodium bicarbonate 150 mEq in sterile water 1000 mL infusion 50 mL/hr at 09/26/15 0607   sodium chloride, acetaminophen **OR** acetaminophen, albuterol, alteplase, dextrose, diphenhydrAMINE, diphenhydrAMINE, EPINEPHrine, EPINEPHrine, EPINEPHrine, EPINEPHrine, famotidine, fludeoxyglucose F - 18, heparin lock flush, heparin lock flush, methylPREDNISolone sodium succinate, ondansetron **OR** ondansetron (ZOFRAN) IV, oxyCODONE, polyethylene glycol, sodium chloride, sodium chloride, sodium chloride

## 2015-09-26 NOTE — Progress Notes (Addendum)
Physical Therapy Treatment Patient Details Name: Carlos Black MRN: [id number] DOB: [birth year] Today's Date: 09/26/2015    History of Present Illness 64 yo male admitted with AKI. 10/24-cystoscopy, L ureteral stent, L radical orchiectomy. Hx of CVA with L residual weakness, DM, HTN, CAD.     PT Comments    Assisted pt from supine to EOB Max assist.  Applied B shoes which include L AFO.  Sit to stand required + 2.  Amb a limited distance using B platform walker. Deconditioned since admission.   Follow Up Recommendations  SNF     Equipment Recommendations       Recommendations for Other Services       Precautions / Restrictions Precautions Precautions: Fall Precaution Comments: left hemiparesis from previous CVA Restrictions Weight Bearing Restrictions: No    Mobility  Bed Mobility Overal bed mobility: Needs Assistance Bed Mobility: Supine to Sit Rolling: Min assist   Supine to sit: Max assist;+2 for physical assistance;+2 for safety/equipment;HOB elevated     General bed mobility comments: once upright, pt was able to static sit EOB with Supervision x 7 min.   Transfers Overall transfer level: Needs assistance Equipment used: Bilateral platform walker Transfers: Sit to/from Stand Sit to Stand: +2 physical assistance;+2 safety/equipment;Mod assist         General transfer comment: elevated bed + 2 assist slow to rise.   Ambulation/Gait Ambulation/Gait assistance: +2 physical assistance;Max assist Ambulation Distance (Feet): 3 Feet Assistive device: Bilateral platform walker Gait Pattern/deviations: Step-to pattern;Step-through pattern Gait velocity: decreased   General Gait Details: used B platform EVA walker + 2 assist and spouse following with recliner.  Had pt wear his shoes and L AFO.  Limited activity tolerance.Recliner brought from behind.    Stairs            Wheelchair Mobility    Modified Rankin (Stroke Patients Only)        Balance                                    Cognition Arousal/Alertness: Awake/alert Behavior During Therapy: Flat affect Overall Cognitive Status: History of cognitive impairments - at baseline                      Exercises Other Exercises Other Exercises: R UE shoulder flexion 10 reps, elbow flexion/extension X 10, wrist and digits -performed several reps. L UE pt raised UE off pillow and then encouraged to use R UE to perform SROM rest of the range X 10.     General Comments        Pertinent Vitals/Pain Pain Assessment: No/denies pain    Home Living Family/patient expects to be discharged to:: Skilled nursing facility                    Prior Function Level of Independence: Needs assistance  Gait / Transfers Assistance Needed: ambulatory with RW and L AFO during last hosptial admission-see previous notes ADL's / Homemaking Assistance Needed: pt states he was unable to perform own bathing and dressing at SNF. He could feed himself.      PT Goals (current goals can now be found in the care plan section) Acute Rehab PT Goals Patient Stated Goal: None stated Progress towards PT goals: Progressing toward goals    Frequency  Min 3X/week    PT Plan      Co-evaluation  End of Session Equipment Utilized During Treatment: Gait belt Activity Tolerance: Patient limited by fatigue Patient left: in chair;with call bell/phone within reach     Time: 1415-1445 PT Time Calculation (min) (ACUTE ONLY): 30 min  Charges:  $Gait Training: 8-22 mins $Therapeutic Activity: 8-22 mins                    G Codes:      Rica Koyanagi  PTA [facility]  Acute  Rehab Pager      [phone]

## 2015-09-26 NOTE — Progress Notes (Addendum)
CSW continuing to follow.   Pt admitted from [facility].   PT DOES NOT HAVE ANY OTHER OPTIONS FOR SNF PLACEMENT. CSW DID A SEARCH UPON ADMISSION AND NO OTHER FACILITY WITHIN [place] IS IN CONTRACT WITH PT INSURANCE AND CAN MEET PT NEED TO COME TO [facility] FOR CHEMOTHERAPY TREATMENTS.  Pt daughter, Lynelle Smoke has been updated and in agreement that pt is not in a position to return home and will return to [facility] upon discharge.  Per MD, pt not yet medically ready for discharge.  CSW to continue to follow.  Alison Murray, MSW, [place] [employer] [phone]

## 2015-09-26 NOTE — Progress Notes (Signed)
[facility] PROGRESS NOTE    Progress Note   Carlos Black [id number]:[id number] DOB: 07/29/1951 DOA: 09/14/2015 PCP: [name], MD   Brief Narrative:   Carlos Black is an 64 y.o. male with a history of CVA and left residual weakness, diabetes mellitus type 2, chronic kidney disease stage III recently diagnosed with high-grade B-cell lymphoma which he has not started chemotherapy with ongoing fevers since last admission came back to the oncology office for chemotherapy teaching was found to have fever with worsening renal function, started CHOP on 10.28.2016, renal consult as he is at high risk of tumor lysis syndrome. His electrolytes have been stable to direct acid is trending up. Is tolerating chemotherapy well. His creatinine is improving.  Assessment/Plan:   AKI (acute kidney injury) ([place]): - Baseline creatinine 1.9-2.3, his creatinine peaked at all 6, now trending down it is 4.6. - Continues to have good urine output, but mildly decreased from previous day. - PICC line placed on 09/14/2015. - His potassium has remained stable but his uric acid continues to trend up. We'll start him on allopurinol.  High-grade B-cell aggressive lymphoma: His Fever have resolved once chemo started.Marland Kitchen Appreciate oncology's assistance. Oncology on board, R-CHOP was started on 09/22/2015. Having mucositis. One BM overnight.  Mild hypoxia: Resolved.  History of CVA (cerebrovascular accident) Continue aspirin.  Sacral decubitus pressure Ulcer: Consult wound care.  Fever: Likely due to lymphoma, infectious workup has been negative.  No fevers overnight.  Leukocytosis: Likely due to steroids.  Hyperglycemia: Check an A1c he was started on steroids for his chemotherapy, his blood glucose went up to 770 had to be started on insulin drip now his blood glucoses well controlled on Lantus plus sliding scale. Continue steroids.    DVT Prophylaxis - Lovenox ordered.  Family  Communication: none Disposition Plan: Home when stable. Code Status:     Code Status Orders        Start     Ordered   09/13/2015 1440  Full code   Continuous     09/07/2015 1439        IV Access:    Peripheral IV   Procedures and diagnostic studies:   No results found.   Medical Consultants:    None.  Anti-Infectives:   Anti-infectives    None      Subjective:    Carlos Black  No compalins  Objective:    Filed Vitals:   09/25/15 1400 09/25/15 1559 09/25/15 2120 09/26/15 0501  BP: 125/63  132/84 114/77  Pulse: 83  84 72  Temp: 97.6 F (36.4 C)  97.5 F (36.4 C) 97.9 F (36.6 C)  TempSrc: Oral  Oral Oral  Resp: 18  18 16   Height:      Weight:  87.907 kg (193 lb 12.8 oz)  88.27 kg (194 lb 9.6 oz)  SpO2: 94%  98% 95%    Intake/Output Summary (Last 24 hours) at 09/26/15 0808 Last data filed at 09/26/15 3818  Gross per 24 hour  Intake 2236.5 ml  Output   1025 ml  Net 1211.5 ml   Filed Weights   09/05/2015 1327 09/25/15 1559 09/26/15 0501  Weight: 76.686 kg (169 lb 1 oz) 87.907 kg (193 lb 12.8 oz) 88.27 kg (194 lb 9.6 oz)    Exam: Gen:  NAD,  Cardiovascular:  RRR, No M/R/G Chest and lungs:   CTAB Abdomen:  Abdomen soft,  not tender, + BS Extremities:  No C/E/C   Data Reviewed:    Labs:  Basic Metabolic Panel:  Recent Labs Lab 09/23/15 0440 09/24/15 0550 09/24/15 1215 09/25/15 0600 09/26/15 0540  NA 136 132* 133* 139 139  K 4.8 4.5 4.2 3.9 3.9  CL 106 99* 96* 100* 97*  CO2 21* 23 25 27 30   GLUCOSE 321* 593* 718* 185* 160*  BUN 46* 66* 67* 75* 84*  CREATININE 5.82* 5.59* 5.57* 5.12* 4.66*  CALCIUM 7.7* 7.3* 7.0* 7.4* 7.3*  PHOS 6.1* 7.2*  --  7.0* 7.0*   GFR Estimated Creatinine Clearance: 15.9 mL/min (by C-G formula based on Cr of 4.66). Liver Function Tests:  Recent Labs Lab 09/20/15 0640 09/22/15 1018 09/23/15 0440 09/24/15 0550 09/25/15 0600 09/26/15 0540  AST 37 39  --   --  18 20  ALT 14* 10*  --   --  7*  6*  ALKPHOS 114 122  --   --  107 101  BILITOT 0.8 0.3  --   --  0.7 0.9  PROT 5.4* 5.6*  --   --  5.2* 5.0*  ALBUMIN 1.9* 2.3* 1.9* 1.9* 2.0* 2.0*   No results for input(s): LIPASE, AMYLASE in the last 168 hours. No results for input(s): AMMONIA in the last 168 hours. Coagulation profile No results for input(s): INR, PROTIME in the last 168 hours.  CBC:  Recent Labs Lab 09/20/15 0640 09/23/15 0440 09/24/15 0818 09/25/15 0600  WBC 9.4 5.2 23.2* 23.0*  NEUTROABS  --   --  22.7* 22.6*  HGB 8.2* 7.4* 7.4* 7.2*  HCT 25.9* 23.9* 23.4* 21.6*  MCV 80.2 77.9* 77.7* 75.3*  PLT 252 235 260 301   Cardiac Enzymes: No results for input(s): CKTOTAL, CKMB, CKMBINDEX, TROPONINI in the last 168 hours. BNP (last 3 results) No results for input(s): PROBNP in the last 8760 hours. CBG:  Recent Labs Lab 09/25/15 0809 09/25/15 1210 09/25/15 1652 09/25/15 2119 09/26/15 0735  GLUCAP 211* 317* 275* 198* 164*   D-Dimer: No results for input(s): DDIMER in the last 72 hours. Hgb A1c:  Recent Labs  09/24/15 0816 09/25/15 0600  HGBA1C 6.8* 7.0*   Lipid Profile: No results for input(s): CHOL, HDL, LDLCALC, TRIG, CHOLHDL, LDLDIRECT in the last 72 hours. Thyroid function studies: No results for input(s): TSH, T4TOTAL, T3FREE, THYROIDAB in the last 72 hours.  Invalid input(s): FREET3 Anemia work up: No results for input(s): VITAMINB12, FOLATE, FERRITIN, TIBC, IRON, RETICCTPCT in the last 72 hours. Sepsis Labs:  Recent Labs Lab 08/27/2015 1419 09/18/2015 1825 09/20/15 0640 09/23/15 0440 09/24/15 0818 09/25/15 0600  WBC  --   --  9.4 5.2 23.2* 23.0*  LATICACIDVEN 1.7 2.2*  --   --   --   --    Microbiology Recent Results (from the past 240 hour(s))  MRSA PCR Screening     Status: None   Collection Time: 08/29/2015 12:26 PM  Result Value Ref Range Status   MRSA by PCR NEGATIVE NEGATIVE Final    Comment:        The GeneXpert MRSA Assay (FDA approved for NASAL specimens only), is  one component of a comprehensive MRSA colonization surveillance program. It is not intended to diagnose MRSA infection nor to guide or monitor treatment for MRSA infections.   Culture, blood (routine x 2)     Status: None (Preliminary result)   Collection Time: 09/21/15  8:40 AM  Result Value Ref Range Status   Specimen Description BLOOD RIGHT HAND  Final   Special Requests BOTTLES DRAWN AEROBIC AND ANAEROBIC 10ML  Final  Culture   Final    NO GROWTH 4 DAYS Performed at [facility]    Report Status PENDING  Incomplete  Culture, blood (routine x 2)     Status: None (Preliminary result)   Collection Time: 09/21/15  8:50 AM  Result Value Ref Range Status   Specimen Description BLOOD RIGHT HAND  Final   Special Requests BOTTLES DRAWN AEROBIC ONLY 10ML  Final   Culture   Final    NO GROWTH 4 DAYS Performed at [facility]    Report Status PENDING  Incomplete     Medications:   . aspirin  81 mg Oral Daily  . enoxaparin (LOVENOX) injection  30 mg Subcutaneous Q24H  . feeding supplement (GLUCERNA SHAKE)  237 mL Oral TID BM  . insulin aspart  0-15 Units Subcutaneous TID WC  . insulin aspart  0-5 Units Subcutaneous QHS  . insulin aspart  3 Units Subcutaneous TID WC  . insulin glargine  20 Units Subcutaneous BID  . pantoprazole  40 mg Oral QHS  . predniSONE  80 mg Oral Q breakfast  . sevelamer carbonate  1,600 mg Oral TID WC  . sodium chloride  10-40 mL Intracatheter Q12H  . Tbo-Filgrastim  300 mcg Subcutaneous q1800   Continuous Infusions: .  sodium bicarbonate 150 mEq in sterile water 1000 mL infusion 50 mL/hr at 09/26/15 0607    Time spent: 25 min   LOS: 7 days   Charlynne Cousins  [facility] Pager [phone]  *Please refer to [email or web address], password [email or web address] to get updated schedule on who will round on this patient, as hospitalists switch teams weekly. If 7PM-7AM, please contact night-coverage at [email or web address], password [email or web address] for any overnight  needs.  09/26/2015, 8:08 AM

## 2015-09-26 NOTE — Care Management Note (Signed)
Case Management Note  Patient Details  Name: Carlos Black MRN: [id number] Date of Birth: 09-11-1951  Subjective/Objective:        64 yo admitted with Acute Kidney Injury            Action/Plan: From [facility] and plans to return there.  Expected Discharge Date:   (UNKNOWN)               Expected Discharge Plan:  Skilled Nursing Facility  In-House Referral:  Clinical Social Work  Discharge planning Services  CM Consult  Post Acute Care Choice:    Choice offered to:     DME Arranged:    DME Agency:     HH Arranged:    [place] Agency:     Status of Service:  Completed, signed off  Medicare Important Message Given:  Yes-second notification given Date Medicare IM Given:    Medicare IM give by:    Date Additional Medicare IM Given:    Additional Medicare Important Message give by:     If discussed at [place] of Stay Meetings, dates discussed:    Additional Comments:  Lynnell Catalan, RN 09/26/2015, 3:21 PM

## 2015-09-26 NOTE — Evaluation (Signed)
Occupational Therapy Evaluation Patient Details Name: Carlos Black MRN: [id number] DOB: 04-26-1951 Today's Date: 09/26/2015    History of Present Illness 64 yo male admitted with AKI. 10/24-cystoscopy, L ureteral stent, L radical orchiectomy. Hx of CVA with L residual weakness, DM, HTN, CAD.    Clinical Impression   Pt seen for OT eval--assisted with bed mobility to straighten bed pads. Encouraged SROM for L UE and AROM exercises for R UE to help with strengthening for better use of UEs in ADL. Pt doesn't verbalize much during session-he did perform exercises once OT encouraged him to perform exercises on his own. Will follow on acute to progress ADL independence.     Follow Up Recommendations  SNF;Supervision/Assistance - 24 hour    Equipment Recommendations  None recommended by OT    Recommendations for Other Services       Precautions / Restrictions Precautions Precautions: Fall Precaution Comments: left hemiparesis from previous CVA      Mobility Bed Mobility Overal bed mobility: Needs Assistance Bed Mobility: Rolling Rolling: Min assist         General bed mobility comments: pt able to roll to the L with use of rail with min assist to straighten bed pads.   Transfers                 General transfer comment: not tested.    Balance                                            ADL Overall ADL's : Needs assistance/impaired Eating/Feeding: Set up;Bed level Eating/Feeding Details (indicate cue type and reason): pt able to use L UE as stabilizer. Grooming: Bed level;Set up;Wash/dry face                                 General ADL Comments: Pt able to roll to the L with the rail with min assist. Educated pt on how to peform UE exercises from bed level to help improve strengthening to better use UE in ADL. See exercises section. Encouraged pt to perform some of his sponge bath while in the hospital  and do what he can do  and have nursing tech help with what he is not able to do. Explained this will help him maintaiin/improve strength as much as possible.      Vision     Perception     Praxis      Pertinent Vitals/Pain       Hand Dominance Right   Extremity/Trunk Assessment Upper Extremity Assessment Upper Extremity Assessment: LUE deficits/detail LUE Deficits / Details: old CVA with residual weakness-uses as a gross assist. Pt able to minimally raise L UE off the pillow. can move UE at elbow, wrist and digits but minimally.  LUE Coordination: decreased fine motor;decreased gross motor           Communication Communication Communication: HOH   Cognition Arousal/Alertness: Awake/alert Behavior During Therapy: Flat affect Overall Cognitive Status: History of cognitive impairments - at baseline                     General Comments       Exercises   Other Exercises Other Exercises: R UE shoulder flexion 10 reps, elbow flexion/extension X 10, wrist and digits -performed several reps. L UE  pt raised UE off pillow and then encouraged to use R UE to perform SROM rest of the range X 10.    Shoulder Instructions      Home Living Family/patient expects to be discharged to:: Skilled nursing facility                                        Prior Functioning/Environment Level of Independence: Needs assistance  Gait / Transfers Assistance Needed: ambulatory with RW and L AFO during last hosptial admission-see previous notes ADL's / Homemaking Assistance Needed: pt states he was unable to perform own bathing and dressing at SNF. He could feed himself.         OT Diagnosis: Generalized weakness;Hemiplegia non-dominant side   OT Problem List: Decreased strength;Decreased knowledge of use of DME or AE;Impaired UE functional use   OT Treatment/Interventions: Self-care/ADL training;Patient/family education;DME and/or AE instruction;Therapeutic exercise    OT  Goals(Current goals can be found in the care plan section) Acute Rehab OT Goals Patient Stated Goal: None stated OT Goal Formulation: With patient Time For Goal Achievement: 10/10/15 Potential to Achieve Goals: Good  OT Frequency: Min 2X/week   Barriers to D/C:            Co-evaluation              End of Session    Activity Tolerance: Patient tolerated treatment well Patient left: in bed;with call bell/phone within reach   Time: 1230-1300 OT Time Calculation (min): 30 min Charges:  OT General Charges $OT Visit: 1 Procedure OT Evaluation $Initial OT Evaluation Tier I: 1 Procedure OT Treatments $Therapeutic Exercise: 8-22 mins G-Codes:    Jules Schick  [phone] 09/26/2015, 1:33 PM

## 2015-09-26 NOTE — Progress Notes (Signed)
IP PROGRESS NOTE  Subjective:   He has no complaint.  Objective: Vital signs in last 24 hours: Blood pressure 134/77, pulse 77, temperature 97.7 F (36.5 C), temperature source Oral, resp. rate 18, height 5\' 5"  (1.651 m), weight 194 lb 9.6 oz (88.27 kg), SpO2 93 %.  Intake/Output from previous day: 10/31 0701 - 11/01 0700 In: 2366.5 [P.O.:1144; I.V.:1222.5] Out: 1025 [Urine:1025]  Physical Exam:  HEENT: No thrush Lungs: Clear anteriorly Cardiac: Regular rate and rhythm Abdomen: No hepatomegaly, no mass, distended Extremities: Trace edema at the left thigh-pitting Neurologic: Alert and oriented, follows commands, 3/5 strength of the left arm and hand, minimal movement of the left leg and foot Skin: Healing incision at the left groin, ecchymoses at the low abdominal wall   right PICC without erythema   Lab Results:  Recent Labs  09/24/15 0818 09/25/15 0600  WBC 23.2* 23.0*  HGB 7.4* 7.2*  HCT 23.4* 21.6*  PLT 260 301    BMET  Recent Labs  09/25/15 0600 09/26/15 0540  NA 139 139  K 3.9 3.9  CL 100* 97*  CO2 27 30  GLUCOSE 185* 160*  BUN 75* 84*  CREATININE 5.12* 4.66*  CALCIUM 7.4* 7.3*     Medications: I have reviewed the patient's current medications.  Assessment/Plan:  1. Left retroperitoneal mass, adrenal masses, left testicular mass, status post a CT-guided biopsy of the left retroperitoneal mass 09/08/2015-pathology confirmed large B-cell lymphoma, CD20 positive  Staging PET scan 09/20/2015 with extensive lymphoma involving the abdomen, adrenal glands, and left kidney  Cycle 1 R-CHOP 09/22/2015 2. Renal failure, left hydronephrosis; left ureter stent placed 09/18/2015. Improved. 3. Left hemiplegia-chronic following a CVA 4. Microcytic anemia secondary to chronic disease, renal failure, surgery, and multiple phlebotomies 5. Fever-resolved on Decadron, Decadron discontinued 09/13/2015 6. Rash/pruritus 09/14/2015-? Contact dermatitis versus drug  rash 7. Status post left orchiectomy and placement of a left ureter stent 09/18/2015. Pathology revealed involvement with large B-cell lymphoma.   His performance status has improved since beginning chemotherapy. There has been no evidence of tumor lysis syndrome. We will check a CBC tomorrow.   Recommendations: 1. Continue gcsf, check CBC 09/27/2015 2. Complete total of 5 days prednisone 3. SNF for physical therapy at discharge      LOS: 7 days   [place]  09/26/2015, 4:16 PM

## 2015-09-26 DEATH — deceased

## 2015-09-27 DIAGNOSIS — R410 Disorientation, unspecified: Secondary | ICD-10-CM | POA: Insufficient documentation

## 2015-09-27 LAB — CBC WITH DIFFERENTIAL/PLATELET
BASOS PCT: 0 %
Basophils Absolute: 0 10*3/uL (ref 0.0–0.1)
EOS ABS: 0 10*3/uL (ref 0.0–0.7)
Eosinophils Relative: 0 %
HCT: 21.8 % — ABNORMAL LOW (ref 39.0–52.0)
HEMOGLOBIN: 6.9 g/dL — AB (ref 13.0–17.0)
LYMPHS ABS: 0.1 10*3/uL — AB (ref 0.7–4.0)
LYMPHS PCT: 1 %
MCH: 24.2 pg — AB (ref 26.0–34.0)
MCHC: 31.7 g/dL (ref 30.0–36.0)
MCV: 76.5 fL — ABNORMAL LOW (ref 78.0–100.0)
MONO ABS: 0 10*3/uL — AB (ref 0.1–1.0)
Monocytes Relative: 0 %
NEUTROS ABS: 9.4 10*3/uL — AB (ref 1.7–7.7)
Neutrophils Relative %: 99 %
Platelets: 289 10*3/uL (ref 150–400)
RBC: 2.85 MIL/uL — ABNORMAL LOW (ref 4.22–5.81)
RDW: 17.4 % — ABNORMAL HIGH (ref 11.5–15.5)
WBC: 9.5 10*3/uL (ref 4.0–10.5)

## 2015-09-27 LAB — COMPREHENSIVE METABOLIC PANEL
ALBUMIN: 2 g/dL — AB (ref 3.5–5.0)
ALT: 7 U/L — ABNORMAL LOW (ref 17–63)
ANION GAP: 8 (ref 5–15)
AST: 19 U/L (ref 15–41)
Alkaline Phosphatase: 99 U/L (ref 38–126)
BILIRUBIN TOTAL: 0.4 mg/dL (ref 0.3–1.2)
BUN: 81 mg/dL — ABNORMAL HIGH (ref 6–20)
CO2: 32 mmol/L (ref 22–32)
Calcium: 7.4 mg/dL — ABNORMAL LOW (ref 8.9–10.3)
Chloride: 100 mmol/L — ABNORMAL LOW (ref 101–111)
Creatinine, Ser: 4.1 mg/dL — ABNORMAL HIGH (ref 0.61–1.24)
GFR calc Af Amer: 16 mL/min — ABNORMAL LOW (ref >60)
GFR calc non Af Amer: 14 mL/min — ABNORMAL LOW (ref >60)
GLUCOSE: 81 mg/dL (ref 65–99)
POTASSIUM: 3.6 mmol/L (ref 3.5–5.1)
SODIUM: 140 mmol/L (ref 135–145)
TOTAL PROTEIN: 5 g/dL — AB (ref 6.5–8.1)

## 2015-09-27 LAB — PHOSPHORUS: Phosphorus: 6.2 mg/dL — ABNORMAL HIGH (ref 2.5–4.6)

## 2015-09-27 LAB — URIC ACID: Uric Acid, Serum: 4.1 mg/dL — ABNORMAL LOW (ref 4.4–7.6)

## 2015-09-27 LAB — GLUCOSE, CAPILLARY
GLUCOSE-CAPILLARY: 62 mg/dL — AB (ref 65–99)
GLUCOSE-CAPILLARY: 286 mg/dL — AB (ref 65–99)
Glucose-Capillary: 90 mg/dL (ref 65–99)
Glucose-Capillary: 162 mg/dL — ABNORMAL HIGH (ref 65–99)
Glucose-Capillary: 339 mg/dL — ABNORMAL HIGH (ref 65–99)

## 2015-09-27 LAB — LACTATE DEHYDROGENASE: LDH: 235 U/L — ABNORMAL HIGH (ref 98–192)

## 2015-09-27 LAB — PREPARE RBC (CROSSMATCH)

## 2015-09-27 LAB — ABO/RH: ABO/RH(D): O POS

## 2015-09-27 MED ORDER — JUVEN PO PACK
1.0000 | PACK | Freq: Two times a day (BID) | ORAL | Status: DC
  Administered 2015-09-27 (×2): 1 via ORAL
  Filled 2015-09-27 (×8): qty 1

## 2015-09-27 MED ORDER — SODIUM CHLORIDE 0.9 % IV SOLN
Freq: Once | INTRAVENOUS | Status: AC
  Administered 2015-09-27: 17:00:00 via INTRAVENOUS

## 2015-09-27 MED ORDER — GLUCERNA SHAKE PO LIQD
237.0000 mL | Freq: Two times a day (BID) | ORAL | Status: DC
  Administered 2015-09-27: 237 mL via ORAL
  Filled 2015-09-27 (×9): qty 237

## 2015-09-27 NOTE — Progress Notes (Signed)
Patient had breakfast,consumed 50% of meal. Will continue to monitor the patient.

## 2015-09-27 NOTE — Progress Notes (Signed)
[facility] PROGRESS NOTE    Progress Note   Carlos Black VOJ:[id number] DOB: June 19, 1951 DOA: 09/03/2015 PCP: [name], MD   Brief Narrative:   Carlos Black is an 64 y.o. male with a history of CVA and left residual weakness, diabetes mellitus type 2, chronic kidney disease stage III recently diagnosed with high-grade B-cell lymphoma which he has not started chemotherapy with ongoing fevers since last admission came back to the oncology office for chemotherapy teaching was found to have fever with worsening renal function, started CHOP on 10.28.2016, renal consult as he is at high risk of tumor lysis syndrome. His electrolytes have been stable to direct acid is trending up. Is tolerating chemotherapy well. His creatinine is improving.  Assessment/Plan:   AKI (acute kidney injury) ([place]): - Baseline creatinine 1.9-2.3, his creatinine peaked at 6, now trending down, today 4.1 - Continues to have good urine output - renal consult appreciated,  - lymphomatous infiltration of kidneys and/or ATN from hypotension on admission  High-grade B-cell aggressive lymphoma: His Fever have resolved once chemo started.Marland Kitchen Appreciate oncology's assistance. Oncology on board, R-CHOP was started on 09/22/2015.  Mild hypoxia: Resolved.  History of CVA (cerebrovascular accident) Continue aspirin.  Sacral decubitus pressure Ulcer: Consult wound care.  Fever: Likely due to lymphoma, infectious workup has been negative.  No fevers overnight.  Leukocytosis: Likely due to steroids.  Hyperglycemia: -hbaic 7.0, SSI -CBg low this am, stop lantus    DVT Prophylaxis - Lovenox ordered.  Family Communication: none Disposition Plan: SNF when stable, ? 2days Code Status: Full COde    Code Status Orders        Start     Ordered   09/17/2015 1440  Full code   Continuous     09/14/2015 1439        IV Access:    Peripheral IV   Procedures and diagnostic studies:    No results found.   Medical Consultants:    None.  Anti-Infectives:   Anti-infectives    None      Subjective:    Carlos Black  No compalins  Objective:    Filed Vitals:   09/26/15 2049 09/27/15 0420 09/27/15 1022 09/27/15 1257  BP: 142/82 125/73  123/77  Pulse: 88 77  86  Temp: 97.8 F (36.6 C) 97.6 F (36.4 C)  97.5 F (36.4 C)  TempSrc: Other (Comment) Oral  Oral  Resp: 20 18  18   Height:   5\' 5"  (1.651 m)   Weight:  87 kg (191 lb 12.8 oz) 87 kg (191 lb 12.8 oz)   SpO2: 94% 93%  93%    Intake/Output Summary (Last 24 hours) at 09/27/15 1545 Last data filed at 09/27/15 1124  Gross per 24 hour  Intake    610 ml  Output   2025 ml  Net  -1415 ml   Filed Weights   09/26/15 0501 09/27/15 0420 09/27/15 1022  Weight: 88.27 kg (194 lb 9.6 oz) 87 kg (191 lb 12.8 oz) 87 kg (191 lb 12.8 oz)    Exam: Gen:  NAD,  Cardiovascular:  RRR, No M/R/G Chest and lungs:   CTAB Abdomen:  Abdomen soft,  not tender, + BS Extremities:  No C/E/C   Data Reviewed:    Labs: Basic Metabolic Panel:  Recent Labs Lab 09/23/15 0440 09/24/15 0550 09/24/15 1215 09/25/15 0600 09/26/15 0540 09/27/15 0522  NA 136 132* 133* 139 139 140  K 4.8 4.5 4.2 3.9 3.9 3.6  CL 106 99* 96*  100* 97* 100*  CO2 21* 23 25 27 30  32  GLUCOSE 321* 593* 718* 185* 160* 81  BUN 46* 66* 67* 75* 84* 81*  CREATININE 5.82* 5.59* 5.57* 5.12* 4.66* 4.10*  CALCIUM 7.7* 7.3* 7.0* 7.4* 7.3* 7.4*  PHOS 6.1* 7.2*  --  7.0* 7.0* 6.2*   GFR Estimated Creatinine Clearance: 18 mL/min (by C-G formula based on Cr of 4.1). Liver Function Tests:  Recent Labs Lab 09/22/15 1018 09/23/15 0440 09/24/15 0550 09/25/15 0600 09/26/15 0540 09/27/15 0522  AST 39  --   --  18 20 19   ALT 10*  --   --  7* 6* 7*  ALKPHOS 122  --   --  107 101 99  BILITOT 0.3  --   --  0.7 0.9 0.4  PROT 5.6*  --   --  5.2* 5.0* 5.0*  ALBUMIN 2.3* 1.9* 1.9* 2.0* 2.0* 2.0*   No results for input(s): LIPASE, AMYLASE in the  last 168 hours. No results for input(s): AMMONIA in the last 168 hours. Coagulation profile No results for input(s): INR, PROTIME in the last 168 hours.  CBC:  Recent Labs Lab 09/23/15 0440 09/24/15 0818 09/25/15 0600 09/27/15 1423  WBC 5.2 23.2* 23.0* 9.5  NEUTROABS  --  22.7* 22.6* 9.4*  HGB 7.4* 7.4* 7.2* 6.9*  HCT 23.9* 23.4* 21.6* 21.8*  MCV 77.9* 77.7* 75.3* 76.5*  PLT 235 260 301 289   Cardiac Enzymes: No results for input(s): CKTOTAL, CKMB, CKMBINDEX, TROPONINI in the last 168 hours. BNP (last 3 results) No results for input(s): PROBNP in the last 8760 hours. CBG:  Recent Labs Lab 09/26/15 1706 09/26/15 2144 09/27/15 0743 09/27/15 0809 09/27/15 1148  GLUCAP 176* 159* 62* 90 162*   D-Dimer: No results for input(s): DDIMER in the last 72 hours. Hgb A1c:  Recent Labs  09/25/15 0600  HGBA1C 7.0*   Lipid Profile: No results for input(s): CHOL, HDL, LDLCALC, TRIG, CHOLHDL, LDLDIRECT in the last 72 hours. Thyroid function studies: No results for input(s): TSH, T4TOTAL, T3FREE, THYROIDAB in the last 72 hours.  Invalid input(s): FREET3 Anemia work up: No results for input(s): VITAMINB12, FOLATE, FERRITIN, TIBC, IRON, RETICCTPCT in the last 72 hours. Sepsis Labs:  Recent Labs Lab 09/23/15 0440 09/24/15 0818 09/25/15 0600 09/27/15 1423  WBC 5.2 23.2* 23.0* 9.5   Microbiology Recent Results (from the past 240 hour(s))  MRSA PCR Screening     Status: None   Collection Time: 09/17/2015 12:26 PM  Result Value Ref Range Status   MRSA by PCR NEGATIVE NEGATIVE Final    Comment:        The GeneXpert MRSA Assay (FDA approved for NASAL specimens only), is one component of a comprehensive MRSA colonization surveillance program. It is not intended to diagnose MRSA infection nor to guide or monitor treatment for MRSA infections.   Culture, blood (routine x 2)     Status: None   Collection Time: 09/21/15  8:40 AM  Result Value Ref Range Status   Specimen  Description BLOOD RIGHT HAND  Final   Special Requests BOTTLES DRAWN AEROBIC AND ANAEROBIC 10ML  Final   Culture   Final    NO GROWTH 5 DAYS Performed at [facility]    Report Status 09/26/2015 FINAL  Final  Culture, blood (routine x 2)     Status: None   Collection Time: 09/21/15  8:50 AM  Result Value Ref Range Status   Specimen Description BLOOD RIGHT HAND  Final  Special Requests BOTTLES DRAWN AEROBIC ONLY 10ML  Final   Culture   Final    NO GROWTH 5 DAYS Performed at [facility]    Report Status 09/26/2015 FINAL  Final     Medications:   . sodium chloride   Intravenous Once  . aspirin  81 mg Oral Daily  . enoxaparin (LOVENOX) injection  30 mg Subcutaneous Q24H  . feeding supplement (GLUCERNA SHAKE)  237 mL Oral BID BM  . insulin aspart  0-15 Units Subcutaneous TID WC  . insulin aspart  0-5 Units Subcutaneous QHS  . nutrition supplement (JUVEN)  1 packet Oral BID BM  . pantoprazole  40 mg Oral QHS  . sodium chloride  10-40 mL Intracatheter Q12H  . Tbo-Filgrastim  300 mcg Subcutaneous q1800   Continuous Infusions:    Time spent: 25 min   LOS: 8 days   [facility]  [facility] Pager [phone]  *Please refer to [email or web address], password [email or web address] to get updated schedule on who will round on this patient, as hospitalists switch teams weekly. If 7PM-7AM, please contact night-coverage at [email or web address], password [email or web address] for any overnight needs.  09/27/2015, 3:45 PM

## 2015-09-27 NOTE — Plan of Care (Signed)
Problem: Discharge Progression Outcomes Goal: Tolerating diet Outcome: Progressing Patient consumed 50% of his breakfast.No c/o n/v.

## 2015-09-27 NOTE — Progress Notes (Signed)
CRITICAL VALUE ALERT  Critical value received:  hgb 6.9  Date of notification:  09/27/15  Time of notification:  0223  Critical value read back:Yes.    Nurse who received alert:  Sandie Ano  MD notified (1st page):  Dr. Domenic Polite  Time of first page:  1446  MD notified (2nd page):  Time of second page:  Responding MD:  Dr. Domenic Polite  Time MD responded:  [phone]

## 2015-09-27 NOTE — Progress Notes (Signed)
IP PROGRESS NOTE  Subjective:   He has no complaint.  Objective: Vital signs in last 24 hours: Blood pressure 123/77, pulse 86, temperature 97.5 F (36.4 C), temperature source Oral, resp. rate 18, height 5\' 5"  (1.651 m), weight 191 lb 12.8 oz (87 kg), SpO2 93 %.  Intake/Output from previous day: 11/01 0701 - 11/02 0700 In: 840 [P.O.:840] Out: 1375 [Urine:1375]  Physical Exam:  HEENT: No thrush Lungs: Clear anteriorly Cardiac: Regular rate and rhythm Abdomen: No hepatomegaly, no mass, distended Extremities: Trace edema at the left thigh-pitting Neurologic: Alert and oriented, follows commands, 3/5 strength of the left arm and hand, minimal movement of the left leg and foot Skin: Healing incision at the left groin, improved ecchymoses at the low abdominal wall  right PICC without erythema   Lab Results:  Recent Labs  09/25/15 0600  WBC 23.0*  HGB 7.2*  HCT 21.6*  PLT 301    BMET  Recent Labs  09/26/15 0540 09/27/15 0522  NA 139 140  K 3.9 3.6  CL 97* 100*  CO2 30 32  GLUCOSE 160* 81  BUN 84* 81*  CREATININE 4.66* 4.10*  CALCIUM 7.3* 7.4*     Medications: I have reviewed the patient's current medications.  Assessment/Plan:  1. Left retroperitoneal mass, adrenal masses, left testicular mass, status post a CT-guided biopsy of the left retroperitoneal mass 09/08/2015-pathology confirmed large B-cell lymphoma, CD20 positive  Staging PET scan 09/20/2015 with extensive lymphoma involving the abdomen, adrenal glands, and left kidney  Cycle 1 R-CHOP 09/22/2015 2. Renal failure, left hydronephrosis; left ureter stent placed 09/18/2015. Improved. 3. Left hemiplegia-chronic following a CVA 4. Microcytic anemia secondary to chronic disease, renal failure, surgery, and multiple phlebotomies 5. Fever-resolved on Decadron, Decadron discontinued 09/13/2015 6. Rash/pruritus 09/14/2015-? Contact dermatitis versus drug rash 7. Status post left orchiectomy and  placement of a left ureter stent 09/18/2015. Pathology revealed involvement with large B-cell lymphoma.  His performance status continues to appear improved following chemotherapy. The renal failure is improving. He is currently at day 6 following chemotherapy. Hopefully he will be ready for discharge to the nursing facility by 09/29/2015.   Recommendations: 1. Continue gcsf for 2 additional doses 2. Complete total of 5 days prednisone 3. SNF for physical therapy at discharge 4. Outpatient follow-up at the [facility] center for cycle 2 R-CHOP will be scheduled for 10/13/2015      LOS: 8 days   Therin Vetsch  09/27/2015, 2:09 PM

## 2015-09-27 NOTE — Progress Notes (Signed)
CSW continuing to follow.   CSW followed up with pt at bedside to provide support. Pt alert and oriented. Pt states that he feels that he is making progress. CSW discussed with pt that CSW is aware of the concerns that pt and pt family had at [facility] prior to admission and CSW has discussed with facility and gotten pt daughter, Lynelle Smoke in touch with facility to discuss concerns. CSW discussed with pt that CSW initiated another SNF search upon pt admission and unfortunately pt does not have any other bed offers for SNF. CSW explained pt the limitations surrounding placement given pt [facility] which is contracted with limited facilities and pt need for transportation to the [facility]. Pt expressed understanding.   CSW contacted pt daughter, Tammy via telephone. Pt daughter confirmed that she has had conversation with [facility] regarding concern.   CSW updated [facility] via telephone and sent updated PT/OT to facility as pt will need [facility] insurance authorization upon discharge.   Per MD, pt not yet medically ready for discharge.  Alison Murray, MSW, [place] [employer] [phone]

## 2015-09-27 NOTE — Progress Notes (Signed)
Hypoglycemic Event  CBG: 62  Treatment: 15 GM carbohydrate snack  Symptoms: None  Follow-up CBG: OIBB:0488 CBG Result:90  Possible Reasons for Event: Inadequate meal intake  Comments/MD notified:Dr. Danford Bad, Sherrine Maples

## 2015-09-27 NOTE — Progress Notes (Signed)
Nutrition Follow-up  DOCUMENTATION CODES:   Non-severe (moderate) malnutrition in context of chronic illness  INTERVENTION:  - Modify Glucerna Supplement to BID (each contains 220 kcal and 10 gm protein).  - Provide Juven supplement BID (each supplement contains 80 kcal and 14 gm protein).  - Encouraged patient to maintain PO intake.  - RD team will continue to monitor for needs.      NUTRITION DIAGNOSIS:   Increased nutrient needs related to wound healing as evidenced by estimated needs.  Ongoing.   GOAL:   Patient will meet greater than or equal to 90% of their needs  Progressing.   MONITOR:   PO intake, Supplement acceptance, Labs, Weight trends, Skin, I & O's  REASON FOR ASSESSMENT:   Low Braden    ASSESSMENT:   64 y.o. male with a history of CVA and left residual weakness, diabetes mellitus type 2, chronic kidney disease stage III recently diagnosed with high-grade B-cell lymphoma which he has not started chemotherapy with ongoing fevers since last admission came back to the oncology office for chemotherapy teaching was found to have fever with worsening renal function  Patient sitting upright in bed during follow-up visit. Patient denies low appeite, nausea, vomitting or any swallowing difficultiies.   During visit, patient was drinking Glucerna shake at bedside. He stated the supplement was fine. RD student encouraged patient to continue eating and drinking supplements to help his wounds heal. Per chart, meal completion is variable; 5-100% (10/29 - 11/1).    Since last visit, patient has begun chemotherapy and steroid treatments.   Labs reviewed: CBG (11-552), low Cl (100), high BUN (81), high Cr (4.10), low Ca (7.4), high P (6.2)  Medications reviewed.   Diet Order:  Diet Heart Room service appropriate?: Yes; Fluid consistency:: Thin  Skin:     Last BM:  10/31  Height:   Ht Readings from Last 1 Encounters:  09/27/15 5\' 5"  (1.651 m)    Weight:    Wt Readings from Last 1 Encounters:  09/27/15 191 lb 12.8 oz (87 kg)    Ideal Body Weight:  61.8 kg  BMI:  Body mass index is 31.92 kg/(m^2).  Estimated Nutritional Needs:   Kcal:  2350-2550  Protein:  80-90g  Fluid:  2L/day  EDUCATION NEEDS:   No education needs identified at this time  Kayleen Memos, Dietetic Intern 09/27/2015 10:31 AM

## 2015-09-28 ENCOUNTER — Inpatient Hospital Stay (HOSPITAL_COMMUNITY): Payer: Medicare HMO

## 2015-09-28 LAB — PHOSPHORUS: PHOSPHORUS: 4.7 mg/dL — AB (ref 2.5–4.6)

## 2015-09-28 LAB — BASIC METABOLIC PANEL
ANION GAP: 12 (ref 5–15)
BUN: 84 mg/dL — ABNORMAL HIGH (ref 6–20)
CALCIUM: 7.6 mg/dL — AB (ref 8.9–10.3)
CO2: 31 mmol/L (ref 22–32)
Chloride: 96 mmol/L — ABNORMAL LOW (ref 101–111)
Creatinine, Ser: 3.6 mg/dL — ABNORMAL HIGH (ref 0.61–1.24)
GFR, EST AFRICAN AMERICAN: 19 mL/min — AB (ref >60)
GFR, EST NON AFRICAN AMERICAN: 16 mL/min — AB (ref >60)
Glucose, Bld: 197 mg/dL — ABNORMAL HIGH (ref 65–99)
POTASSIUM: 4 mmol/L (ref 3.5–5.1)
Sodium: 139 mmol/L (ref 135–145)

## 2015-09-28 LAB — URINALYSIS, ROUTINE W REFLEX MICROSCOPIC
BILIRUBIN URINE: NEGATIVE
GLUCOSE, UA: NEGATIVE mg/dL
KETONES UR: NEGATIVE mg/dL
NITRITE: NEGATIVE
PROTEIN: 100 mg/dL — AB
Specific Gravity, Urine: 1.01 (ref 1.005–1.030)
Urobilinogen, UA: 0.2 mg/dL (ref 0.0–1.0)
pH: 6.5 (ref 5.0–8.0)

## 2015-09-28 LAB — CBC
HEMATOCRIT: 26.2 % — AB (ref 39.0–52.0)
Hemoglobin: 8.5 g/dL — ABNORMAL LOW (ref 13.0–17.0)
MCH: 24.8 pg — ABNORMAL LOW (ref 26.0–34.0)
MCHC: 32.4 g/dL (ref 30.0–36.0)
MCV: 76.4 fL — AB (ref 78.0–100.0)
Platelets: 298 10*3/uL (ref 150–400)
RBC: 3.43 MIL/uL — AB (ref 4.22–5.81)
RDW: 17 % — AB (ref 11.5–15.5)
WBC: 5.2 10*3/uL (ref 4.0–10.5)

## 2015-09-28 LAB — LACTIC ACID, PLASMA
LACTIC ACID, VENOUS: 1.9 mmol/L (ref 0.5–2.0)
Lactic Acid, Venous: 1.9 mmol/L (ref 0.5–2.0)

## 2015-09-28 LAB — URINE MICROSCOPIC-ADD ON

## 2015-09-28 LAB — GLUCOSE, CAPILLARY
GLUCOSE-CAPILLARY: 141 mg/dL — AB (ref 65–99)
GLUCOSE-CAPILLARY: 187 mg/dL — AB (ref 65–99)
GLUCOSE-CAPILLARY: 200 mg/dL — AB (ref 65–99)
Glucose-Capillary: 181 mg/dL — ABNORMAL HIGH (ref 65–99)

## 2015-09-28 MED ORDER — SODIUM CHLORIDE 0.9 % IV SOLN
INTRAVENOUS | Status: DC
  Administered 2015-09-28 – 2015-09-29 (×3): via INTRAVENOUS

## 2015-09-28 NOTE — Progress Notes (Signed)
Occupational Therapy Treatment Patient Details Name: Carlos Black MRN: [id number] DOB: Aug 11, 1951 Today's Date: 09/28/2015    History of present illness 64 yo male admitted with AKI. 10/24-cystoscopy, L ureteral stent, L radical orchiectomy. Hx of CVA with L residual weakness, DM, HTN, CAD.    OT comments  Pt seen for acute OT treatment session for bilateral UE passive range of motion/ therapeutic exercise at bed level. Pt appeared lethargic today but nodded his head "yes" when asked if he was willing to participate in therapy session. Pt was not noted to actively move RUE except for A/AROM shoulder flexion after PROM was performed. Bilateral UE's were elevated on pillows to assist with edema control & positioning in bed at the conclusion of therapy.   Follow Up Recommendations  SNF;Supervision/Assistance - 24 hour    Equipment Recommendations  None recommended by OT    Recommendations for Other Services      Precautions / Restrictions         Mobility Bed Mobility                  Transfers                      Balance                                   ADL Overall ADL's : Needs assistance/impaired                                       General ADL Comments: Pt seen today for Acute OT session for bilateral UE range of motion and ther ex. (Please see exercise section for details and exercises performed).      Vision                     Perception     Praxis      Cognition   Behavior During Therapy: Flat affect Overall Cognitive Status: History of cognitive impairments - at baseline                       Extremity/Trunk Assessment               Exercises General Exercises - Upper Extremity Shoulder Flexion: PROM;Right;Left;10 reps;Supine Shoulder Extension: PROM;Right;Left;10 reps;Supine Shoulder Horizontal ABduction: PROM;Right;Left;10 reps;Supine Elbow Flexion: PROM;Left;Right;10  reps;Supine Elbow Extension: PROM;Right;Left;10 reps;Supine Wrist Flexion: PROM;Right;Left;Supine;5 reps Wrist Extension: PROM;Right;Left;5 reps Digit Composite Flexion: PROM;Right;Left;5 reps Composite Extension: PROM;Left;Right;5 reps   Shoulder Instructions       General Comments      Pertinent Vitals/ Pain          Home Living                                          Prior Functioning/Environment              Frequency Min 2X/week     Progress Toward Goals  OT Goals(current goals can now be found in the care plan section)  Progress towards OT goals: Progressing toward goals  Acute Rehab OT Goals Patient Stated Goal: None stated  Plan Discharge plan remains appropriate    Co-evaluation  End of Session     Activity Tolerance Patient tolerated treatment well   Patient Left in bed;with call bell/phone within reach;with bed alarm set   Nurse Communication          Time: 0254-2706 OT Time Calculation (min): 19 min  Charges: OT General Charges $OT Visit: 1 Procedure OT Treatments $Therapeutic Exercise: 8-22 mins  [place], Amy Beth Dixon, OTR/L 09/28/2015, 12:04 PM

## 2015-09-28 NOTE — Progress Notes (Signed)
Physical Therapy Treatment Patient Details Name: Carlos Black MRN: [id number] DOB: December 09, 1950 Today's Date: 09/28/2015    History of Present Illness 64 yo male admitted with AKI. 10/24-cystoscopy, L ureteral stent, L radical orchiectomy. Hx of CVA with L residual weakness, DM, HTN, CAD.     PT Comments    Total assist +2 for all mobility on today. Pt appears lethargic. Very little responses to questions. Not following commands consistently. Pt is not progressing with mobility. Very different presentation compared to previous sessions.   Follow Up Recommendations  SNF     Equipment Recommendations  None recommended by PT    Recommendations for Other Services       Precautions / Restrictions Precautions Precautions: Fall Precaution Comments: L hemiparesis; L AFO Restrictions Weight Bearing Restrictions: No    Mobility  Bed Mobility Overal bed mobility: Needs Assistance Bed Mobility: Supine to Sit;Sit to Supine     Supine to sit: Total assist;+2 for physical assistance;+2 for safety/equipment;HOB elevated Sit to supine: Total assist;+2 for physical assistance;+2 for safety/equipment;HOB elevated   General bed mobility comments: Max multiimodal cueing for participation, technique. Assist for trunk and bil LEs. Utilized bedpad for scooting, positioning. Very little assist, if any, from pt on today  Transfers                 General transfer comment: NT-unable to attempt  Ambulation/Gait                 Stairs            Wheelchair Mobility    Modified Rankin (Stroke Patients Only)       Balance Overall balance assessment: Needs assistance Sitting-balance support: Feet supported Sitting balance-Leahy Scale: Zero Sitting balance - Comments: Sat EOB ~5 mintues-Total assist. Pt unable to sit unsupported on today.                             Cognition Arousal/Alertness: Lethargic Behavior During Therapy: Flat affect Overall  Cognitive Status: History of cognitive impairments - at baseline                      Exercises General Exercises - Upper Extremity Shoulder Flexion: PROM;Right;Left;10 reps;Supine Shoulder Extension: PROM;Right;Left;10 reps;Supine Shoulder Horizontal ABduction: PROM;Right;Left;10 reps;Supine Elbow Flexion: PROM;Left;Right;10 reps;Supine Elbow Extension: PROM;Right;Left;10 reps;Supine Wrist Flexion: PROM;Right;Left;Supine;5 reps Wrist Extension: PROM;Right;Left;5 reps Digit Composite Flexion: PROM;Right;Left;5 reps Composite Extension: PROM;Left;Right;5 reps    General Comments        Pertinent Vitals/Pain Pain Assessment: Faces Faces Pain Scale: Hurts little more Pain Location: back Pain Descriptors / Indicators: Sore Pain Intervention(s): Monitored during session;Repositioned    Home Living                      Prior Function            PT Goals (current goals can now be found in the care plan section) Acute Rehab PT Goals Patient Stated Goal: None stated Progress towards PT goals: Not progressing toward goals - comment (Total assist for all mobility on today. Lethargic. Very little verbal responses.. Not following commands well)    Frequency  Min 2X/week    PT Plan Current plan remains appropriate;Frequency needs to be updated    Co-evaluation             End of Session   Activity Tolerance: Patient limited by fatigue;Patient limited by lethargy Patient left: in  bed;with call bell/phone within reach;with bed alarm set     Time: 8648-4720 PT Time Calculation (min) (ACUTE ONLY): 21 min  Charges:  $Therapeutic Activity: 8-22 mins                    G Codes:      Weston Anna, MPT Pager: [phone]

## 2015-09-28 NOTE — Progress Notes (Signed)
Pt lethargic this morning. Wife was phoned and she said that this was normal for him, he likes to sleep in and he should wake up around 10- lunch time. Pt still very lethargic throughout the day. Is responsive, alert to person and place. Temperature just rose to 101 degrees, tylenol suppository was administered. Dressing on bottom changed

## 2015-09-28 NOTE — Progress Notes (Addendum)
[facility] PROGRESS NOTE    Progress Note   Carlos Black VPX:[id number] DOB: [birth year] DOA: 09/09/2015 PCP: [name], MD   Brief Narrative:   Carlos Black is an 64 y.o. male with a history of CVA and left residual weakness, diabetes mellitus type 2, chronic kidney disease stage III recently diagnosed with high-grade B-cell lymphoma which he has not started chemotherapy with ongoing fevers since last admission came back to the oncology office for chemotherapy teaching was found to have fever with worsening renal function, started CHOP on 10.28.2016, renal consult as he is at high risk of tumor lysis syndrome. His electrolytes have been stable to direct acid is trending up. Is tolerating chemotherapy well. His creatinine is improving.  Assessment/Plan:   AKI (acute kidney injury) ([place]): - Baseline creatinine 1.9-2.3, his creatinine peaked at 6, now trending down, today 3.6 - Continues to have good urine output - renal consult appreciated,  - lymphomatous infiltration of kidneys and/or ATN from hypotension on admission  High-grade B-cell aggressive lymphoma: His Fever resolved once chemo started.Marland Kitchen Appreciate oncology's assistance. Oncology on board, R-CHOP on 09/22/2015.  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  History of CVA (cerebrovascular accident) Continue aspirin.  Sacral decubitus pressure Ulcer: Wound care.  Fever: Likely due to lymphoma, infectious workup has been negative.  No fevers overnight.  Leukocytosis: Likely due to steroids.  DM -hbaic 7.0, SSI -Hypoglycemic 11/2 am, stopped lantus, monitor    DVT Prophylaxis - Lovenox ordered.  Family Communication: none Disposition Plan: SNF  when stable, ? 2days Code Status: Full COde    Code Status Orders        Start     Ordered   09/24/2015 1440  Full code   Continuous     09/25/2015 1439        IV Access:    Peripheral IV   Procedures and diagnostic studies:   No results found.   Medical Consultants:    None.  Anti-Infectives:   Anti-infectives    None      Subjective:    Carlos Black  No compalins , confused this am Objective:    Filed Vitals:   09/27/15 2009 09/28/15 0559 09/28/15 0827 09/28/15 1331  BP: 131/71 137/75 139/78 139/79  Pulse: 87 90 100 113  Temp: 97.9 F (36.6 C) 98.6 F (37 C) 98.6 F (37 C) 99.6 F (37.6 C)  TempSrc: Oral Oral Oral Oral  Resp:    22  Height:      Weight:  87.544 kg (193 lb)    SpO2: 97% 98% 94% 98%    Intake/Output Summary (Last 24 hours) at 09/28/15 1346 Last data filed at 09/28/15 1327  Gross per 24 hour  Intake    565 ml  Output   2475 ml  Net  -1910 ml   Filed Weights   09/27/15 0420 09/27/15 1022 09/28/15 0559  Weight: 87 kg (191 lb 12.8 oz) 87 kg (191 lb 12.8 oz) 87.544 kg (193 lb)    Exam: Gen:  Awake, but confused, significant lag in answering questions Cardiovascular:  RRR, No M/R/G Chest and lungs:   CTAB Abdomen:  Abdomen soft,  not tender, + BS Extremities:  No C/E/C Neuro: L hemiplegia   Data Reviewed:    Labs: Basic Metabolic Panel:  Recent  Labs Lab 09/24/15 0550 09/24/15 1215 09/25/15 0600 09/26/15 0540 09/27/15 0522 09/28/15 0330  NA 132* 133* 139 139 140 139  K 4.5 4.2 3.9 3.9 3.6 4.0  CL 99* 96* 100* 97* 100* 96*  CO2 23 25 27 30  32 31  GLUCOSE 593* 718* 185* 160* 81 197*  BUN 66* 67* 75* 84* 81* 84*  CREATININE 5.59* 5.57* 5.12* 4.66* 4.10* 3.60*  CALCIUM 7.3* 7.0* 7.4* 7.3* 7.4* 7.6*  PHOS 7.2*  --  7.0* 7.0* 6.2* 4.7*   GFR Estimated Creatinine Clearance: 20.5 mL/min (by C-G formula based on Cr of 3.6). Liver Function Tests:  Recent Labs Lab 09/22/15 1018 09/23/15 0440  09/24/15 0550 09/25/15 0600 09/26/15 0540 09/27/15 0522  AST 39  --   --  18 20 19   ALT 10*  --   --  7* 6* 7*  ALKPHOS 122  --   --  107 101 99  BILITOT 0.3  --   --  0.7 0.9 0.4  PROT 5.6*  --   --  5.2* 5.0* 5.0*  ALBUMIN 2.3* 1.9* 1.9* 2.0* 2.0* 2.0*   No results for input(s): LIPASE, AMYLASE in the last 168 hours. No results for input(s): AMMONIA in the last 168 hours. Coagulation profile No results for input(s): INR, PROTIME in the last 168 hours.  CBC:  Recent Labs Lab 09/23/15 0440 09/24/15 0818 09/25/15 0600 09/27/15 1423 09/28/15 0330  WBC 5.2 23.2* 23.0* 9.5 5.2  NEUTROABS  --  22.7* 22.6* 9.4*  --   HGB 7.4* 7.4* 7.2* 6.9* 8.5*  HCT 23.9* 23.4* 21.6* 21.8* 26.2*  MCV 77.9* 77.7* 75.3* 76.5* 76.4*  PLT 235 260 301 289 298   Cardiac Enzymes: No results for input(s): CKTOTAL, CKMB, CKMBINDEX, TROPONINI in the last 168 hours. BNP (last 3 results) No results for input(s): PROBNP in the last 8760 hours. CBG:  Recent Labs Lab 09/27/15 1148 09/27/15 1640 09/27/15 2144 09/28/15 0733 09/28/15 1158  GLUCAP 162* 339* 286* 181* 141*   D-Dimer: No results for input(s): DDIMER in the last 72 hours. Hgb A1c: No results for input(s): HGBA1C in the last 72 hours. Lipid Profile: No results for input(s): CHOL, HDL, LDLCALC, TRIG, CHOLHDL, LDLDIRECT in the last 72 hours. Thyroid function studies: No results for input(s): TSH, T4TOTAL, T3FREE, THYROIDAB in the last 72 hours.  Invalid input(s): FREET3 Anemia work up: No results for input(s): VITAMINB12, FOLATE, FERRITIN, TIBC, IRON, RETICCTPCT in the last 72 hours. Sepsis Labs:  Recent Labs Lab 09/24/15 0818 09/25/15 0600 09/27/15 1423 09/28/15 0330  WBC 23.2* 23.0* 9.5 5.2   Microbiology Recent Results (from the past 240 hour(s))  MRSA PCR Screening     Status: None   Collection Time: 09/10/2015 12:26 PM  Result Value Ref Range Status   MRSA by PCR NEGATIVE NEGATIVE Final    Comment:        The  GeneXpert MRSA Assay (FDA approved for NASAL specimens only), is one component of a comprehensive MRSA colonization surveillance program. It is not intended to diagnose MRSA infection nor to guide or monitor treatment for MRSA infections.   Culture, blood (routine x 2)     Status: None   Collection Time: 09/21/15  8:40 AM  Result Value Ref Range Status   Specimen Description BLOOD RIGHT HAND  Final   Special Requests BOTTLES DRAWN AEROBIC AND ANAEROBIC 10ML  Final   Culture   Final    NO GROWTH 5 DAYS Performed at [facility]  Report Status 09/26/2015 FINAL  Final  Culture, blood (routine x 2)     Status: None   Collection Time: 09/21/15  8:50 AM  Result Value Ref Range Status   Specimen Description BLOOD RIGHT HAND  Final   Special Requests BOTTLES DRAWN AEROBIC ONLY 10ML  Final   Culture   Final    NO GROWTH 5 DAYS Performed at [facility]    Report Status 09/26/2015 FINAL  Final     Medications:   . aspirin  81 mg Oral Daily  . enoxaparin (LOVENOX) injection  30 mg Subcutaneous Q24H  . feeding supplement (GLUCERNA SHAKE)  237 mL Oral BID BM  . insulin aspart  0-15 Units Subcutaneous TID WC  . insulin aspart  0-5 Units Subcutaneous QHS  . nutrition supplement (JUVEN)  1 packet Oral BID BM  . pantoprazole  40 mg Oral QHS  . sodium chloride  10-40 mL Intracatheter Q12H  . Tbo-Filgrastim  300 mcg Subcutaneous q1800   Continuous Infusions: . sodium chloride      Time spent: 25 min   LOS: 9 days   Carlos Black  [facility] Pager [phone]  *Please refer to [email or web address], password [email or web address] to get updated schedule on who will round on this patient, as hospitalists switch teams weekly. If 7PM-7AM, please contact night-coverage at [email or web address], password [email or web address] for any overnight needs.  09/28/2015, 1:46 PM

## 2015-09-29 ENCOUNTER — Inpatient Hospital Stay (HOSPITAL_COMMUNITY): Payer: Medicare HMO | Admitting: Anesthesiology

## 2015-09-29 ENCOUNTER — Inpatient Hospital Stay (HOSPITAL_COMMUNITY): Payer: Medicare HMO

## 2015-09-29 DIAGNOSIS — A419 Sepsis, unspecified organism: Secondary | ICD-10-CM | POA: Insufficient documentation

## 2015-09-29 DIAGNOSIS — R4182 Altered mental status, unspecified: Secondary | ICD-10-CM

## 2015-09-29 DIAGNOSIS — R6521 Severe sepsis with septic shock: Secondary | ICD-10-CM

## 2015-09-29 DIAGNOSIS — J9601 Acute respiratory failure with hypoxia: Secondary | ICD-10-CM | POA: Insufficient documentation

## 2015-09-29 DIAGNOSIS — Z09 Encounter for follow-up examination after completed treatment for conditions other than malignant neoplasm: Secondary | ICD-10-CM

## 2015-09-29 LAB — BLOOD GAS, ARTERIAL
ACID-BASE DEFICIT: 4.6 mmol/L — AB (ref 0.0–2.0)
ACID-BASE EXCESS: 2 mmol/L (ref 0.0–2.0)
BICARBONATE: 24.2 meq/L — AB (ref 20.0–24.0)
BICARBONATE: 18.7 meq/L — AB (ref 20.0–24.0)
Drawn by: 331471
Drawn by: 331471
FIO2: 1
FIO2: 0.7
LHR: 22 {breaths}/min
LHR: 14 {breaths}/min
O2 SAT: 99.9 %
O2 Saturation: 98.9 %
PATIENT TEMPERATURE: 98.6
PEEP/CPAP: 5 cmH2O
PEEP/CPAP: 5 cmH2O
PH ART: 7.538 — AB (ref 7.350–7.450)
PO2 ART: 167 mmHg — AB (ref 80.0–100.0)
Patient temperature: 98.6
TCO2: 22.7 mmol/L (ref 0–100)
TCO2: 17.7 mmol/L (ref 0–100)
VT: 530 mL
VT: 530 mL
pCO2 arterial: 28.5 mmHg — ABNORMAL LOW (ref 35.0–45.0)
pCO2 arterial: 28.6 mmHg — ABNORMAL LOW (ref 35.0–45.0)
pH, Arterial: 7.43 (ref 7.350–7.450)
pO2, Arterial: 333 mmHg — ABNORMAL HIGH (ref 80.0–100.0)

## 2015-09-29 LAB — BASIC METABOLIC PANEL
Anion gap: 11 (ref 5–15)
BUN: 78 mg/dL — AB (ref 6–20)
CHLORIDE: 102 mmol/L (ref 101–111)
CO2: 29 mmol/L (ref 22–32)
CREATININE: 3.65 mg/dL — AB (ref 0.61–1.24)
Calcium: 7.2 mg/dL — ABNORMAL LOW (ref 8.9–10.3)
GFR, EST AFRICAN AMERICAN: 19 mL/min — AB (ref >60)
GFR, EST NON AFRICAN AMERICAN: 16 mL/min — AB (ref >60)
Glucose, Bld: 225 mg/dL — ABNORMAL HIGH (ref 65–99)
POTASSIUM: 4.4 mmol/L (ref 3.5–5.1)
SODIUM: 142 mmol/L (ref 135–145)

## 2015-09-29 LAB — CBC
HCT: 24.8 % — ABNORMAL LOW (ref 39.0–52.0)
HEMOGLOBIN: 8 g/dL — AB (ref 13.0–17.0)
MCH: 25.5 pg — ABNORMAL LOW (ref 26.0–34.0)
MCHC: 32.3 g/dL (ref 30.0–36.0)
MCV: 79 fL (ref 78.0–100.0)
PLATELETS: 178 10*3/uL (ref 150–400)
RBC: 3.14 MIL/uL — AB (ref 4.22–5.81)
RDW: 18.1 % — ABNORMAL HIGH (ref 11.5–15.5)
WBC: 0.3 10*3/uL — CL (ref 4.0–10.5)

## 2015-09-29 LAB — GLUCOSE, CAPILLARY
GLUCOSE-CAPILLARY: 189 mg/dL — AB (ref 65–99)
GLUCOSE-CAPILLARY: 224 mg/dL — AB (ref 65–99)
GLUCOSE-CAPILLARY: 372 mg/dL — AB (ref 65–99)
Glucose-Capillary: 247 mg/dL — ABNORMAL HIGH (ref 65–99)

## 2015-09-29 LAB — URINALYSIS, ROUTINE W REFLEX MICROSCOPIC
Bilirubin Urine: NEGATIVE
Glucose, UA: 100 mg/dL — AB
Ketones, ur: NEGATIVE mg/dL
NITRITE: NEGATIVE
PH: 6.5 (ref 5.0–8.0)
Protein, ur: 100 mg/dL — AB
SPECIFIC GRAVITY, URINE: 1.013 (ref 1.005–1.030)
UROBILINOGEN UA: 0.2 mg/dL (ref 0.0–1.0)

## 2015-09-29 LAB — APTT: aPTT: 34 seconds (ref 24–37)

## 2015-09-29 LAB — PHOSPHORUS: PHOSPHORUS: 4.6 mg/dL (ref 2.5–4.6)

## 2015-09-29 LAB — TYPE AND SCREEN
ABO/RH(D): O POS
ABO/RH(D): O POS
ANTIBODY SCREEN: NEGATIVE
ANTIBODY SCREEN: NEGATIVE
UNIT DIVISION: 0

## 2015-09-29 LAB — CORTISOL: Cortisol, Plasma: 11.8 ug/dL

## 2015-09-29 LAB — PROCALCITONIN: Procalcitonin: 36.7 ng/mL

## 2015-09-29 LAB — TROPONIN I: Troponin I: 0.05 ng/mL — ABNORMAL HIGH (ref <0.031)

## 2015-09-29 LAB — URINE MICROSCOPIC-ADD ON

## 2015-09-29 LAB — LACTIC ACID, PLASMA
LACTIC ACID, VENOUS: 3.7 mmol/L — AB (ref 0.5–2.0)
Lactic Acid, Venous: 4.3 mmol/L (ref 0.5–2.0)

## 2015-09-29 LAB — PROTIME-INR
INR: 1.49 (ref 0.00–1.49)
Prothrombin Time: 18.1 seconds — ABNORMAL HIGH (ref 11.6–15.2)

## 2015-09-29 MED ORDER — PROPOFOL 10 MG/ML IV BOLUS
INTRAVENOUS | Status: AC
  Filled 2015-09-29: qty 20

## 2015-09-29 MED ORDER — SODIUM CHLORIDE 0.9 % IV SOLN
0.0300 [IU]/min | INTRAVENOUS | Status: DC
  Administered 2015-09-29 – 2015-09-30 (×2): 0.03 [IU]/min via INTRAVENOUS
  Filled 2015-09-29 (×2): qty 2

## 2015-09-29 MED ORDER — HYDROCORTISONE NA SUCCINATE PF 100 MG IJ SOLR
50.0000 mg | Freq: Four times a day (QID) | INTRAMUSCULAR | Status: DC
  Administered 2015-09-29 – 2015-10-01 (×8): 50 mg via INTRAVENOUS
  Filled 2015-09-29 (×8): qty 2

## 2015-09-29 MED ORDER — FENTANYL CITRATE (PF) 100 MCG/2ML IJ SOLN
50.0000 ug | INTRAMUSCULAR | Status: DC | PRN
  Administered 2015-09-29 – 2015-09-30 (×4): 50 ug via INTRAVENOUS
  Filled 2015-09-29 (×4): qty 2

## 2015-09-29 MED ORDER — IOHEXOL 300 MG/ML  SOLN: 25.0000 mL | INTRAMUSCULAR | Status: AC

## 2015-09-29 MED ORDER — SODIUM CHLORIDE 0.9 % IV SOLN
100.0000 mg | INTRAVENOUS | Status: DC
  Administered 2015-09-30: 100 mg via INTRAVENOUS
  Filled 2015-09-29 (×2): qty 100

## 2015-09-29 MED ORDER — PIPERACILLIN-TAZOBACTAM 3.375 G IVPB
3.3750 g | Freq: Three times a day (TID) | INTRAVENOUS | Status: DC
  Administered 2015-09-29 (×2): 3.375 g via INTRAVENOUS
  Filled 2015-09-29 (×2): qty 50

## 2015-09-29 MED ORDER — SODIUM CHLORIDE 0.9 % IV BOLUS (SEPSIS)
500.0000 mL | Freq: Once | INTRAVENOUS | Status: AC
  Administered 2015-09-29: 500 mL via INTRAVENOUS

## 2015-09-29 MED ORDER — NOREPINEPHRINE BITARTRATE 1 MG/ML IV SOLN
2.0000 ug/min | INTRAVENOUS | Status: DC
  Administered 2015-09-29: 25 ug/min via INTRAVENOUS
  Filled 2015-09-29 (×2): qty 4

## 2015-09-29 MED ORDER — MIDAZOLAM HCL 2 MG/2ML IJ SOLN
1.0000 mg | INTRAMUSCULAR | Status: DC | PRN
  Filled 2015-09-29: qty 2

## 2015-09-29 MED ORDER — FENTANYL CITRATE (PF) 100 MCG/2ML IJ SOLN
50.0000 ug | INTRAMUSCULAR | Status: DC | PRN
  Administered 2015-09-29: 50 ug via INTRAVENOUS

## 2015-09-29 MED ORDER — DEXTROSE 5 % IV SOLN
2.0000 ug/min | INTRAVENOUS | Status: DC
  Administered 2015-09-29: 20 ug/min via INTRAVENOUS
  Administered 2015-09-30: 26 ug/min via INTRAVENOUS
  Administered 2015-09-30 (×2): 45 ug/min via INTRAVENOUS
  Filled 2015-09-29 (×6): qty 16

## 2015-09-29 MED ORDER — PROPOFOL 10 MG/ML IV BOLUS
INTRAVENOUS | Status: DC | PRN
  Administered 2015-09-29: 30 mg via INTRAVENOUS
  Administered 2015-09-29: 20 mg via INTRAVENOUS
  Administered 2015-09-29: 50 mg via INTRAVENOUS

## 2015-09-29 MED ORDER — ANIDULAFUNGIN 100 MG IV SOLR
200.0000 mg | Freq: Once | INTRAVENOUS | Status: AC
  Administered 2015-09-29: 200 mg via INTRAVENOUS
  Filled 2015-09-29: qty 200

## 2015-09-29 MED ORDER — DEXTROSE 5 % IV SOLN
2.0000 g | Freq: Once | INTRAVENOUS | Status: AC
  Administered 2015-09-29: 2 g via INTRAVENOUS
  Filled 2015-09-29: qty 2

## 2015-09-29 MED ORDER — VANCOMYCIN HCL IN DEXTROSE 1-5 GM/200ML-% IV SOLN
1000.0000 mg | INTRAVENOUS | Status: DC
  Administered 2015-09-29 (×2): 1000 mg via INTRAVENOUS
  Filled 2015-09-29 (×2): qty 200

## 2015-09-29 MED ORDER — FENTANYL CITRATE (PF) 100 MCG/2ML IJ SOLN
INTRAMUSCULAR | Status: AC
  Filled 2015-09-29: qty 2

## 2015-09-29 MED ORDER — EPINEPHRINE HCL 1 MG/ML IJ SOLN
0.5000 ug/min | INTRAVENOUS | Status: DC
  Administered 2015-09-29: 0.5 ug/min via INTRAVENOUS
  Filled 2015-09-29 (×3): qty 4

## 2015-09-29 MED ORDER — FENTANYL CITRATE (PF) 100 MCG/2ML IJ SOLN
50.0000 ug | INTRAMUSCULAR | Status: DC | PRN
  Administered 2015-09-29 (×2): 50 ug via INTRAVENOUS
  Filled 2015-09-29: qty 2

## 2015-09-29 MED ORDER — PANTOPRAZOLE SODIUM 40 MG IV SOLR
40.0000 mg | Freq: Every day | INTRAVENOUS | Status: DC
  Administered 2015-09-29 – 2015-09-30 (×2): 40 mg via INTRAVENOUS
  Filled 2015-09-29 (×2): qty 40

## 2015-09-29 MED ORDER — MIDAZOLAM HCL 2 MG/2ML IJ SOLN
1.0000 mg | INTRAMUSCULAR | Status: DC | PRN
  Administered 2015-09-29 – 2015-09-30 (×4): 1 mg via INTRAVENOUS
  Filled 2015-09-29 (×3): qty 2

## 2015-09-29 MED ORDER — SODIUM CHLORIDE 0.9 % IV BOLUS (SEPSIS)
1000.0000 mL | INTRAVENOUS | Status: AC
  Administered 2015-09-29 (×3): 1000 mL via INTRAVENOUS

## 2015-09-29 MED ORDER — PHENYLEPHRINE 40 MCG/ML (10ML) SYRINGE FOR IV PUSH (FOR BLOOD PRESSURE SUPPORT)
PREFILLED_SYRINGE | INTRAVENOUS | Status: AC
  Filled 2015-09-29: qty 10

## 2015-09-29 MED ORDER — PHENYLEPHRINE HCL 10 MG/ML IJ SOLN
INTRAMUSCULAR | Status: DC | PRN
  Administered 2015-09-29: 80 ug via INTRAVENOUS

## 2015-09-29 MED ORDER — TBO-FILGRASTIM 480 MCG/0.8ML ~~LOC~~ SOSY
480.0000 ug | PREFILLED_SYRINGE | Freq: Every day | SUBCUTANEOUS | Status: AC
  Administered 2015-09-29: 480 ug via SUBCUTANEOUS
  Filled 2015-09-29: qty 1

## 2015-09-29 NOTE — Progress Notes (Signed)
Discussed with RN Sol to change norepinephrine to quad strength. RN aware to change setting in alaris pump [employer].    Ralene Bathe, PharmD, BCPS 09/29/2015, 10:04 AM  Pager: [phone]

## 2015-09-29 NOTE — Progress Notes (Signed)
ANTIBIOTIC CONSULT NOTE - INITIAL  Pharmacy Consult for anidulafungin Indication: septic shock  No Known Allergies  Patient Measurements: Height: 5\' 5"  (165.1 cm) Weight: 191 lb 3.2 oz (86.728 kg) IBW/kg (Calculated) : 61.5  Vital Signs: Temp: 102.5 F (39.2 C) (11/04 1200) Temp Source: Oral (11/04 1200) BP: 54/23 mmHg (11/04 1200) Pulse Rate: 110 (11/04 1508) Intake/Output from previous day: 11/03 0701 - 11/04 0700 In: 230 [P.O.:220; I.V.:10] Out: 1400 [Urine:1400] Intake/Output from this shift: Total I/O In: 6290.7 [I.V.:2107.4; Other:1000; IV Piggyback:3183.3] Out: 200 [Urine:200]  Labs:  Recent Labs  09/27/15 0522 09/27/15 1423 09/28/15 0330 09/29/15 0705  WBC  --  9.5 5.2 0.3*  HGB  --  6.9* 8.5* 8.0*  PLT  --  289 298 178  CREATININE 4.10*  --  3.60* 3.65*   Estimated Creatinine Clearance: 20.2 mL/min (by C-G formula based on Cr of 3.65). No results for input(s): VANCOTROUGH, VANCOPEAK, VANCORANDOM, GENTTROUGH, GENTPEAK, GENTRANDOM, TOBRATROUGH, TOBRAPEAK, TOBRARND, AMIKACINPEAK, AMIKACINTROU, AMIKACIN in the last 72 hours.   Microbiology: Recent Results (from the past 720 hour(s))  Blood culture (routine x 2)     Status: None   Collection Time: 09/05/15  3:00 PM  Result Value Ref Range Status   Specimen Description BLOOD RIGHT ARM  Final   Special Requests BOTTLES DRAWN AEROBIC AND ANAEROBIC 5CC  Final   Culture NO GROWTH 5 DAYS  Final   Report Status 09/10/2015 FINAL  Final  Blood culture (routine x 2)     Status: None   Collection Time: 09/05/15  4:43 PM  Result Value Ref Range Status   Specimen Description BLOOD LEFT HAND  Final   Special Requests BOTTLES DRAWN AEROBIC AND ANAEROBIC 4CC  Final   Culture NO GROWTH 5 DAYS  Final   Report Status 09/10/2015 FINAL  Final  MRSA PCR Screening     Status: None   Collection Time: 09/05/15  8:28 PM  Result Value Ref Range Status   MRSA by PCR NEGATIVE NEGATIVE Final    Comment:        The GeneXpert MRSA  Assay (FDA approved for NASAL specimens only), is one component of a comprehensive MRSA colonization surveillance program. It is not intended to diagnose MRSA infection nor to guide or monitor treatment for MRSA infections.   Urine culture     Status: None   Collection Time: 09/05/15 10:20 PM  Result Value Ref Range Status   Specimen Description URINE, RANDOM  Final   Special Requests NONE  Final   Culture 3,000 COLONIES/mL INSIGNIFICANT GROWTH  Final   Report Status 09/07/2015 FINAL  Final  Culture, blood (routine x 2)     Status: None   Collection Time: 09/07/15  9:17 AM  Result Value Ref Range Status   Specimen Description BLOOD RIGHT ANTECUBITAL  Final   Special Requests BOTTLES DRAWN AEROBIC AND ANAEROBIC 5CC 10CC  Final   Culture NO GROWTH 5 DAYS  Final   Report Status 09/12/2015 FINAL  Final  Culture, blood (routine x 2)     Status: None   Collection Time: 09/07/15  9:27 AM  Result Value Ref Range Status   Specimen Description BLOOD RIGHT HAND  Final   Special Requests BOTTLES DRAWN AEROBIC AND ANAEROBIC 10CC  Final   Culture NO GROWTH 5 DAYS  Final   Report Status 09/12/2015 FINAL  Final  C difficile quick scan w PCR reflex     Status: None   Collection Time: 09/13/15  4:00 AM  Result Value Ref Range Status   C Diff antigen NEGATIVE NEGATIVE Final   C Diff toxin NEGATIVE NEGATIVE Final   C Diff interpretation Negative for toxigenic C. difficile  Final  MRSA PCR Screening     Status: None   Collection Time: 09/11/2015 12:26 PM  Result Value Ref Range Status   MRSA by PCR NEGATIVE NEGATIVE Final    Comment:        The GeneXpert MRSA Assay (FDA approved for NASAL specimens only), is one component of a comprehensive MRSA colonization surveillance program. It is not intended to diagnose MRSA infection nor to guide or monitor treatment for MRSA infections.   Culture, blood (routine x 2)     Status: None   Collection Time: 09/21/15  8:40 AM  Result Value Ref Range  Status   Specimen Description BLOOD RIGHT HAND  Final   Special Requests BOTTLES DRAWN AEROBIC AND ANAEROBIC 10ML  Final   Culture   Final    NO GROWTH 5 DAYS Performed at [facility]    Report Status 09/26/2015 FINAL  Final  Culture, blood (routine x 2)     Status: None   Collection Time: 09/21/15  8:50 AM  Result Value Ref Range Status   Specimen Description BLOOD RIGHT HAND  Final   Special Requests BOTTLES DRAWN AEROBIC ONLY 10ML  Final   Culture   Final    NO GROWTH 5 DAYS Performed at [facility]    Report Status 09/26/2015 FINAL  Final  Culture, Urine     Status: None (Preliminary result)   Collection Time: 09/28/15  1:54 PM  Result Value Ref Range Status   Specimen Description URINE, CATHETERIZED  Final   Special Requests NONE  Final   Culture   Final    TOO YOUNG TO READ Performed at [facility]    Report Status PENDING  Incomplete    Medical History: Past Medical History  Diagnosis Date  . Hypertension   . Coronary artery disease   . Myocardial infarction ([place])   . CAD (coronary artery disease) 09/05/2015  . SIRS (systemic inflammatory response syndrome) ([place]) 09/06/2015  . Acute renal failure (ARF) ([place]) 09/06/2015  . Type II diabetes mellitus ([place])   . Stroke [facility]) 2004    w/left sided weakness/notes 09/05/2015    Medications:  Scheduled:  . anidulafungin  200 mg Intravenous Once   Followed by  . [START ON 09/30/2015] anidulafungin  100 mg Intravenous Q24H  . aspirin  81 mg Oral Daily  . enoxaparin (LOVENOX) injection  30 mg Subcutaneous Q24H  . feeding supplement (GLUCERNA SHAKE)  237 mL Oral BID BM  . fentaNYL      . hydrocortisone sod succinate (SOLU-CORTEF) inj  50 mg Intravenous 4 times per day  . insulin aspart  0-15 Units Subcutaneous TID WC  . insulin aspart  0-5 Units Subcutaneous QHS  . nutrition supplement (JUVEN)  1 packet Oral BID BM  . pantoprazole (PROTONIX) IV  40 mg Intravenous Daily  .  piperacillin-tazobactam (ZOSYN)  IV  3.375 g Intravenous Q8H  . sodium chloride  10-40 mL Intracatheter Q12H  . Tbo-Filgrastim  480 mcg Subcutaneous q1800  . vancomycin  1,000 mg Intravenous Q24H   Infusions:  . sodium chloride 75 mL/hr at 09/29/15 1300  . epinephrine 2 mcg/min (09/29/15 1518)  . norepinephrine (LEVOPHED) Adult infusion 45 mcg/min (09/29/15 1350)  . vasopressin (PITRESSIN) infusion - *FOR SHOCK* 0.03 Units/min (09/29/15 1300)   Assessment:  64 yo male known to pharmacy for administration of chemo R-CHOP on 10/28. Transferred to ICU for septic shock on 11/4.  Vancomycin and Zosyn initiated, Tmax 103.8, WBC WNL (on Granix s/p chemo), SCr 3.6 with est CrCl 20.  Pharmacy consulted to start anidulafungin as well.  Goal of Therapy:  Vancomycin trough level 15-20 mcg/ml  Doses adjusted per renal function Eradication of infection  Plan:  1.  Anidulafungin 200 mg IV x 1 then 100 mg q24h. 2.  Following daily.  Hershal Coria, PharmD, BCPS Pager: [phone] 09/29/2015 3:28 PM

## 2015-09-29 NOTE — Progress Notes (Signed)
Spouse notified pt being transfer to ICU

## 2015-09-29 NOTE — Procedures (Signed)
Arterial Catheter Insertion Procedure Note Carlos Black [id number] 17-Dec-1950  Procedure: Insertion of Arterial Catheter  Indications: Blood pressure monitoring and Frequent blood sampling  Procedure Details Consent: Risks of procedure as well as the alternatives and risks of each were explained to the (patient/caregiver).  Consent for procedure obtained. Time Out: Verified patient identification, verified procedure, site/side was marked, verified correct patient position, special equipment/implants available, medications/allergies/relevent history reviewed, required imaging and test results available.  Performed  Maximum sterile technique was used including cap, gloves, gown, hand hygiene, mask and sheet. Skin prep: Chlorhexidine; local anesthetic administered 20 gauge catheter was inserted into left radial artery using the Seldinger technique.  Evaluation Blood flow good; BP tracing good. Complications: No apparent complications.   Johnette Abraham 09/29/2015

## 2015-09-29 NOTE — Progress Notes (Signed)
[facility] PCCM  I had a conversation with the patient's daughter and explained the severity of his septic shock.  Considering his baseline comorbid illnesses prior to his diagnosis of B-Cell lymphoma (with a lower chance of survival) and now with septic shock, his prognosis is grim.  We decided to NOT perform CPR but to carry out full vent and medical support otherwise including vasopressors.  I don't think that starting CVVHD is wise as this would likely lead to a prolonged ICU stay which would be very likely unsurvivable.  Roselie Awkward, MD [place] P[facility]M Pager: [phone] Cell: [phone] After 3pm or if no response, call [phone]

## 2015-09-29 NOTE — Progress Notes (Signed)
[place] Progress Note Patient Name: Carlos Black DOB: [birth year] MRN: [id number]   Date of Service  09/29/2015  HPI/Events of Note  Agitation - grabbing at IV tubing and stacking breaths on the ventilator.   eICU Interventions  Will increase Fentanyl IV PRN to Q 1 hour PRN.     Intervention Category Minor Interventions: Agitation / anxiety - evaluation and management  Carlos Black Eugene 09/29/2015, 8:16 PM

## 2015-09-29 NOTE — Progress Notes (Signed)
[facility] P[facility]  Called back to see Mr. Lonzo Cloud due to progressive shock  Sepsis re-assement as follows:  Sepsis - Repeat Assessment  Performed at:    1500  Vitals     Blood pressure 54/23, pulse 107, temperature 102.5 F (39.2 C), temperature source Oral, resp. rate 19, height 5\' 5"  (1.651 m), weight 86.728 kg (191 lb 3.2 oz), SpO2 99 %.  Heart:     Tachycardic  Lungs:    CTA  Capillary Refill:   <2 sec  Peripheral Pulse:   Dorsalis pedis pulse  palpable  Skin:     Normal Color, warm, well perfused    Septic shock: worsening despite being well volume resuscitated; lactic acid rising.  This is due to his profound neutropenia. He has been too unstable for a CT scan due to progressive shock Source is likely gut translocation Prognosis is dismal  Plan I have added epinephrine, will not be able to add more than this Check ABG for acidosis May need bicarb infusion Add anidulafungin for anti-fungal coverage  Family updated that he may die this evening.  They voiced understanding.  Additional CC time by me 60 minutes  Roselie Awkward, MD [place] P[facility]M Pager: [phone] Cell: [phone] After 3pm or if no response, call [phone]

## 2015-09-29 NOTE — Consult Note (Signed)
PULMONARY / CRITICAL CARE MEDICINE   Name: Carlos Black MRN: [id number] DOB: 1951/05/20    ADMISSION DATE:  09/01/2015 CONSULTATION DATE:   09/29/2015  REFERRING MD :  Dr. Broadus John triad hospitalist  CHIEF COMPLAINT:  Altered mental status, shortness of breath  INITIAL PRESENTATION: 64 year old male with a recent diagnosis of B-cell lymphoma who started chemotherapy on 09/22/2015 was admitted initially on 09/12/2015 with acute kidney injury. He was intubated in the early morning of 09/29/2015 for confusion, altered mental status.  STUDIES:  09/20/2015 CT head: No acute intracranial process, chronic white matter disease noted 09/20/2015 PET CT: Extensive tumor involvement involving a large left retroperitoneal mass encasing the ureter and involving the left kidney and pararenal space, bilateral adrenal malignancy, hypermetabolic pleural-based nodules in the chest and hypermetabolic adenopathy. Moderate size left pleural effusion, small focus of muscular activity left gluteus medius muscle probably malignant. Coronary atherosclerosis.  SIGNIFICANT EVENTS: 08/27/2015 Admission the hospital  09/29/2015 Intubation for confusion, altered mental status, shortness of breath, fever early morning   HISTORY OF PRESENT ILLNESS:  This is a 64 year old male with a recent diagnosis of B-cell lymphoma, chronic kidney disease and diabetes mellitus who was transferred to the ICU this morning because of altered mental status, respiratory failure, and a fever. He was intubated on my arrival and we're unable to obtain a history so history was obtained from chart review. It appears that he was recently hospitalized in October 2016 at [facility] for weakness and was noted to have a large retroperitoneal mass. This was biopsied and was found to be consistent with high-grade B-cell lymphoma. During his hospitalization urology was consulted because of hydronephrosis secondary to the mass. On 09/18/2015 he  underwent cystoscopy, dilation of urethral stricture, left retrograde pyelogram, left JJ urethral stent placement, left radical orchiectomy. Pathology specimen from his left testicle shows involvement of B-cell lymphoma. He came to the oncology office on 08/30/2015 where he was noted to be weak, lethargic, had a low-grade temperature and was hypoxemic. He was admitted to [facility]. He received IV fluids and supportive care. He had a low-grade fever not long after admission, chart review reveals that he was febrile around the time of his initial diagnosis of lymphoma and his fever was felt to be due to his lymphoma. On 09/21/2015 the decision was made to proceed with chemotherapy. On 09/22/2015 chemotherapy with R-CHOP was initiated. He was treated with rasburicase prophylaxis.   Notes from nephrology around the time he was started on chemotherapy note that he was unable to carry on a detailed conversation regarding the significance of his acute kidney injury. At this time he was treated with bicarbonate containing IV fluids.   Over the last several days he has been noted to be intermittently confused, had a low-grade fever, but a normal lactic acid. He has been noted by nursing staff to have delirium overnight. Early in the morning on 09/29/2015 he was found to be obtunded, breathing 40 times per minute, hypotensive, and with a very high fever. He was intubated and transferred to the intensive care unit.   PAST MEDICAL HISTORY :   has a past medical history of Hypertension; Coronary artery disease; Myocardial infarction ([place]); CAD (coronary artery disease) (09/05/2015); SIRS (systemic inflammatory response syndrome) ([place]) (09/06/2015); Acute renal failure (ARF) ([place]) (09/06/2015); Type II diabetes mellitus ([place]); and Stroke ([place]) (2004).  has past surgical history that includes basilar arterty stent (07/25/2004); Orchiectomy (Left, 09/18/2015); and Cystoscopy w/ ureteral stent placement (Left,  09/18/2015). Prior  to Admission medications   Medication Sig Start Date End Date Taking? Authorizing Provider  amLODipine (NORVASC) 10 MG tablet Take 1 tablet (10 mg total) by mouth daily. 12/25/11 09/11/2015 Yes Lavone Orn, MD  aspirin 81 MG chewable tablet Chew 1 tablet (81 mg total) by mouth daily. 09/14/15  Yes Domenic Polite, MD  atorvastatin (LIPITOR) 10 MG tablet Take 1 tablet (10 mg total) by mouth daily. 12/25/11 09/18/2015 Yes Lavone Orn, MD  ondansetron (ZOFRAN) 4 MG tablet Take 1 tablet (4 mg total) by mouth every 6 (six) hours as needed for nausea. 09/14/15  Yes Domenic Polite, MD  oxyCODONE (OXY IR/ROXICODONE) 5 MG immediate release tablet Take 1 tablet (5 mg total) by mouth every 4 (four) hours as needed for moderate pain. DEA# [id number] 09/18/15  Yes Christell Faith, MD  pantoprazole (PROTONIX) 40 MG tablet Take 1 tablet (40 mg total) by mouth at bedtime. 09/14/15  Yes Domenic Polite, MD  allopurinol (ZYLOPRIM) 300 MG tablet Take 300 mg by mouth daily. Start 09/20/15 for 10 days due to elevated uric acid    Historical Provider, MD   No Known Allergies  FAMILY HISTORY:  indicated that his mother is deceased. He indicated that his father is alive.  SOCIAL HISTORY:  reports that he has never smoked. He has never used smokeless tobacco. He reports that he does not drink alcohol or use illicit drugs.  REVIEW OF SYSTEMS:  Cannot obtain due to intubation   SUBJECTIVE:   VITAL SIGNS: Temp:  [98.6 F (37 C)-103.8 F (39.9 C)] 103.8 F (39.9 C) (11/04 0600) Pulse Rate:  [100-133] 122 (11/04 0654) Resp:  [22] 22 (11/04 0600) BP: (59-147)/(42-79) 59/42 mmHg (11/04 0654) SpO2:  [79 %-98 %] 79 % (11/04 0654) Weight:  [86.728 kg (191 lb 3.2 oz)] 86.728 kg (191 lb 3.2 oz) (11/04 0600) HEMODYNAMICS:   VENTILATOR SETTINGS:   INTAKE / OUTPUT:  Intake/Output Summary (Last 24 hours) at 09/29/15 0737 Last data filed at 09/29/15 3474  Gross per 24 hour  Intake    230 ml  Output    1400 ml  Net  -1170 ml    PHYSICAL EXAMINATION: General:  Frail, elderly male intubated Neuro:  Sedated, on vent HEENT:  NCAT, ETT in place Cardiovascular:  RRR, no mgr Lungs:  Rhonchi bilaterally Abdomen:  Distended, bowel sounds positive Musculoskeletal:  No gross deformities Skin:  Inguinal scar well healed, mild bruising around scar, no redness or drainage  LABS:  CBC  Recent Labs Lab 09/25/15 0600 09/27/15 1423 09/28/15 0330  WBC 23.0* 9.5 5.2  HGB 7.2* 6.9* 8.5*  HCT 21.6* 21.8* 26.2*  PLT 301 289 298   Coag's No results for input(s): APTT, INR in the last 168 hours. BMET  Recent Labs Lab 09/26/15 0540 09/27/15 0522 09/28/15 0330  NA 139 140 139  K 3.9 3.6 4.0  CL 97* 100* 96*  CO2 30 32 31  BUN 84* 81* 84*  CREATININE 4.66* 4.10* 3.60*  GLUCOSE 160* 81 197*   Electrolytes  Recent Labs Lab 09/26/15 0540 09/27/15 0522 09/28/15 0330  CALCIUM 7.3* 7.4* 7.6*  PHOS 7.0* 6.2* 4.7*   Sepsis Markers  Recent Labs Lab 09/28/15 1730 09/28/15 2030  LATICACIDVEN 1.9 1.9   ABG No results for input(s): PHART, PCO2ART, PO2ART in the last 168 hours. Liver Enzymes  Recent Labs Lab 09/25/15 0600 09/26/15 0540 09/27/15 0522  AST 18 20 19   ALT 7* 6* 7*  ALKPHOS 107 101 99  BILITOT 0.7 0.9  0.4  ALBUMIN 2.0* 2.0* 2.0*   Cardiac Enzymes No results for input(s): TROPONINI, PROBNP in the last 168 hours. Glucose  Recent Labs Lab 09/27/15 1640 09/27/15 2144 09/28/15 0733 09/28/15 1158 09/28/15 1753 09/28/15 2137  GLUCAP 339* 286* 181* 141* 187* 200*    Imaging 09/29/2015 chest x-ray images personally reviewed showing an ET tube which is slightly low and a questionable infiltrate versus atelectasis in left lung    ASSESSMENT / PLAN:  PULMONARY OETT 09/29/2015 A:Acute respiratory failure with hypoxemia and setting of altered mental status, etiology uncertain but he is high-risk for healthcare associated pneumonia versus aspiration  pneumonia.  P:   Full vent support ABG in 1 hour Daily chest x-ray  CARDIOVASCULAR CVLPICC line 09/21/2015 A: Septic shock, currently appears volume replete P: Volume resuscitate with normal saline, 30 mL per And reassess hypertension Repeat lactic acid Levothroid as needed for mean arterial pressure greater than 65 Telemetry monitoring  RENAL A:  Non-oliguric acute on chronic kidney disease, complicated by recent discovery of likely lymphoma involvement in kidney Hydronephrosis treated successfully with uretal stenting P: Nephrology following Continue IV fluids Continue Foley catheter Monitor BMET and UOP Replace electrolytes as needed  GASTROINTESTINAL A:  No acute issues P:   Place OG tube Start tube feedings pepcid for stress ulcer prophylaxis  HEMATOLOGIC A:  B-Cell lymphoma, s/p treatment with doxorubicin, cyyclophosphamide, vincristine, and rituxan 10/28 Neutropenic  P:  Continue Neupogen Monitor CBC Transfuse if Hgb < 7gm/dL  INFECTIOUS A:  Septic shock> no clear source available; ddx includes UTI, intra-abdominal? Gut translocation with gram negative rod bacteremia highly likely given neutropenia; possibly HCAP Sacral decub present on admission P:   BCx2 09/21/2015  Negative Urine Culture x2 09/28/2015 No Growth  Blood culture 09/29/2015 >   UC 11/4 >   Sputum 11/4 >   Vanc 11/4 >  Ceftaz 11/4 x1 dose>  Zosyn 11/4 >   CT abdomen/pelvis ordered given recent surgery, no clear source of sepsis  ENDOCRINE A:  Relative adrenal insufficiency   P:   Hydrocortisone (stress dose) Monitor glucose   NEUROLOGIC A:  Acute encephalopathy, uncertain etiology Baseline s/p CVA, immobile, minimally verbal P:   RASS goal: -1 Fentanyl/versed prn   FAMILY  - Updates: daughter updated 09/29/2015  - Inter-disciplinary family meet or Palliative Care meeting due by:  day 7 (10/06/2015)  My cc time 51 minutes  Roselie Awkward, MD [place] PCCM Pager:  [phone] Cell: [phone] After 3pm or if no response, call [phone]   09/29/2015, 7:37 AM

## 2015-09-29 NOTE — Progress Notes (Signed)
CSW continuing to follow.   CSW reviewed chart and noted that pt intubated and transferred to ICU.   CSW updated [facility].   CSW to continue to follow to provide support.   Alison Murray, MSW, [place] Work [phone]

## 2015-09-29 NOTE — Progress Notes (Signed)
Two Rt's placed a-line. Pt had no adverse reaction to procedure.

## 2015-09-29 NOTE — Anesthesia Procedure Notes (Signed)
Procedure Name: Intubation Date/Time: 09/29/2015 7:16 AM Performed by: Lollie Sails Pre-anesthesia Checklist: Patient identified, Emergency Drugs available, Suction available, Patient being monitored and Timeout performed Patient Re-evaluated:Patient Re-evaluated prior to inductionOxygen Delivery Method: Ambu bag Preoxygenation: Pre-oxygenation with 100% oxygen Intubation Type: IV induction Ventilation: Mask ventilation without difficulty Laryngoscope Size: Miller and 3 Grade View: Grade I Tube type: Subglottic suction tube Tube size: 8.0 mm Number of attempts: 1 Airway Equipment and Method: Stylet Placement Confirmation: ETT inserted through vocal cords under direct vision,  positive ETCO2 and breath sounds checked- equal and bilateral Secured at: 22 cm Tube secured with: Tape Dental Injury: Teeth and Oropharynx as per pre-operative assessment

## 2015-09-29 NOTE — Progress Notes (Signed)
Date: September 29, 2015 Chart reviewed for concurrent status and case management needs. Will continue to follow patient for changes and needs: Patient transferred from acture care floor to icu and intubated. Velva Harman, RN, BSN, [place]   [phone]

## 2015-09-29 NOTE — Progress Notes (Addendum)
ANTIBIOTIC CONSULT NOTE - INITIAL  Pharmacy Consult for vancomycin, Zosyn Indication: rule out sepsis  No Known Allergies  Patient Measurements: Height: 5\' 5"  (165.1 cm) Weight: 191 lb 3.2 oz (86.728 kg) IBW/kg (Calculated) : 61.5  Vital Signs: Temp: 103.8 F (39.9 C) (11/04 0600) Temp Source: Axillary (11/04 0600) BP: 59/42 mmHg (11/04 0654) Pulse Rate: 122 (11/04 0654) Intake/Output from previous day: 11/03 0701 - 11/04 0700 In: 230 [P.O.:220; I.V.:10] Out: 1400 [Urine:1400] Intake/Output from this shift:    Labs:  Recent Labs  09/27/15 0522 09/27/15 1423 09/28/15 0330  WBC  --  9.5 5.2  HGB  --  6.9* 8.5*  PLT  --  289 298  CREATININE 4.10*  --  3.60*   Estimated Creatinine Clearance: 20.4 mL/min (by C-G formula based on Cr of 3.6). No results for input(s): VANCOTROUGH, VANCOPEAK, VANCORANDOM, GENTTROUGH, GENTPEAK, GENTRANDOM, TOBRATROUGH, TOBRAPEAK, TOBRARND, AMIKACINPEAK, AMIKACINTROU, AMIKACIN in the last 72 hours.   Microbiology: Recent Results (from the past 720 hour(s))  Blood culture (routine x 2)     Status: None   Collection Time: 09/05/15  3:00 PM  Result Value Ref Range Status   Specimen Description BLOOD RIGHT ARM  Final   Special Requests BOTTLES DRAWN AEROBIC AND ANAEROBIC 5CC  Final   Culture NO GROWTH 5 DAYS  Final   Report Status 09/10/2015 FINAL  Final  Blood culture (routine x 2)     Status: None   Collection Time: 09/05/15  4:43 PM  Result Value Ref Range Status   Specimen Description BLOOD LEFT HAND  Final   Special Requests BOTTLES DRAWN AEROBIC AND ANAEROBIC 4CC  Final   Culture NO GROWTH 5 DAYS  Final   Report Status 09/10/2015 FINAL  Final  MRSA PCR Screening     Status: None   Collection Time: 09/05/15  8:28 PM  Result Value Ref Range Status   MRSA by PCR NEGATIVE NEGATIVE Final    Comment:        The GeneXpert MRSA Assay (FDA approved for NASAL specimens only), is one component of a comprehensive MRSA  colonization surveillance program. It is not intended to diagnose MRSA infection nor to guide or monitor treatment for MRSA infections.   Urine culture     Status: None   Collection Time: 09/05/15 10:20 PM  Result Value Ref Range Status   Specimen Description URINE, RANDOM  Final   Special Requests NONE  Final   Culture 3,000 COLONIES/mL INSIGNIFICANT GROWTH  Final   Report Status 09/07/2015 FINAL  Final  Culture, blood (routine x 2)     Status: None   Collection Time: 09/07/15  9:17 AM  Result Value Ref Range Status   Specimen Description BLOOD RIGHT ANTECUBITAL  Final   Special Requests BOTTLES DRAWN AEROBIC AND ANAEROBIC 5CC 10CC  Final   Culture NO GROWTH 5 DAYS  Final   Report Status 09/12/2015 FINAL  Final  Culture, blood (routine x 2)     Status: None   Collection Time: 09/07/15  9:27 AM  Result Value Ref Range Status   Specimen Description BLOOD RIGHT HAND  Final   Special Requests BOTTLES DRAWN AEROBIC AND ANAEROBIC 10CC  Final   Culture NO GROWTH 5 DAYS  Final   Report Status 09/12/2015 FINAL  Final  C difficile quick scan w PCR reflex     Status: None   Collection Time: 09/13/15  4:00 AM  Result Value Ref Range Status   C Diff antigen NEGATIVE NEGATIVE Final  C Diff toxin NEGATIVE NEGATIVE Final   C Diff interpretation Negative for toxigenic C. difficile  Final  MRSA PCR Screening     Status: None   Collection Time: 09/03/2015 12:26 PM  Result Value Ref Range Status   MRSA by PCR NEGATIVE NEGATIVE Final    Comment:        The GeneXpert MRSA Assay (FDA approved for NASAL specimens only), is one component of a comprehensive MRSA colonization surveillance program. It is not intended to diagnose MRSA infection nor to guide or monitor treatment for MRSA infections.   Culture, blood (routine x 2)     Status: None   Collection Time: 09/21/15  8:40 AM  Result Value Ref Range Status   Specimen Description BLOOD RIGHT HAND  Final   Special Requests BOTTLES DRAWN  AEROBIC AND ANAEROBIC 10ML  Final   Culture   Final    NO GROWTH 5 DAYS Performed at [facility]    Report Status 09/26/2015 FINAL  Final  Culture, blood (routine x 2)     Status: None   Collection Time: 09/21/15  8:50 AM  Result Value Ref Range Status   Specimen Description BLOOD RIGHT HAND  Final   Special Requests BOTTLES DRAWN AEROBIC ONLY 10ML  Final   Culture   Final    NO GROWTH 5 DAYS Performed at [facility]    Report Status 09/26/2015 FINAL  Final    Medical History: Past Medical History  Diagnosis Date  . Hypertension   . Coronary artery disease   . Myocardial infarction ([place])   . CAD (coronary artery disease) 09/05/2015  . SIRS (systemic inflammatory response syndrome) ([place]) 09/06/2015  . Acute renal failure (ARF) ([place]) 09/06/2015  . Type II diabetes mellitus ([place])   . Stroke [facility]) 2004    w/left sided weakness/notes 09/05/2015    Medications:  Scheduled:  . aspirin  81 mg Oral Daily  . cefTAZidime (FORTAZ)  IV  2 g Intravenous Once  . enoxaparin (LOVENOX) injection  30 mg Subcutaneous Q24H  . feeding supplement (GLUCERNA SHAKE)  237 mL Oral BID BM  . insulin aspart  0-15 Units Subcutaneous TID WC  . insulin aspart  0-5 Units Subcutaneous QHS  . nutrition supplement (JUVEN)  1 packet Oral BID BM  . pantoprazole  40 mg Oral QHS  . sodium chloride  10-40 mL Intracatheter Q12H  . Tbo-Filgrastim  300 mcg Subcutaneous q1800   Infusions:  . sodium chloride 75 mL/hr at 09/28/15 1521   Assessment: 64 yo male known to pharmacy for administration of chemo R-CHOP on 10/28. Now transferring to ICU for decreased 02Sats, fever and possible sepsis pharmacy to dose vancomycin and Zosyn, Tmax 103.8, WBC WNL (on Granix s/p chemo), SCr 3.6 with est CrCl 20  Goal of Therapy:  Vancomycin trough level 15-20 mcg/ml  Plan:  1) Vancomycin 1g IV q24 2) Start Zosyn 3.375g IV q8 (extended interval infusion) after ceftaz 2g x1 given as ordered earlier 3)  Monitor renal function closely since dosed abx's a little more aggressive based on renal function   Adrian Saran, PharmD, BCPS Pager [phone] 09/29/2015 7:31 AM

## 2015-09-29 NOTE — Significant Event (Signed)
Rapid Response Event Note  Overview: CALLED TO ASSESS PATIENT WITH DECREASED SATS AND BP. PRIOR TO ARRIVAL NOTIFIED THAT PATIENT WAS GOING TO BE INTUBATED. PATIENT NOT RESPONSIVE AND ON NRB. CRNA TO ROOM AND INTUBATED PRIOR TO TRANSFER TO 1239      Initial Focused Assessment:   Interventions:   Event Summary: Name of Physician Notified: DR. SHERRILL at    Name of Consulting Physician Notified: PCCM at    Outcome: Transferred (Comment) ([id number])  Event End Time: [place]  Pricilla Riffle

## 2015-09-29 NOTE — Progress Notes (Addendum)
IP PROGRESS NOTE  Subjective:   He became more confused last night per the nursing staff and is unresponsive this morning.  Objective: Vital signs in last 24 hours: Blood pressure 90/37, pulse 115, temperature 101.2 F (38.4 C), temperature source Oral, resp. rate 22, height 5\' 5"  (1.651 m), weight 191 lb 3.2 oz (86.728 kg), SpO2 100 %.  Intake/Output from previous day: 11/03 0701 - 11/04 0700 In: 79 [P.O.:220; I.V.:10] Out: 1400 [Urine:1400]  Physical Exam:   Lungs: Clear anteriorly Cardiac: Regular rate and rhythm, tachycardia Abdomen: Distended  Neurologic: Opens eyes, otherwise unresponsive   right PICC without erythema   Lab Results:  Recent Labs  09/28/15 0330 09/29/15 0705  WBC 5.2 0.3*  HGB 8.5* 8.0*  HCT 26.2* 24.8*  PLT 298 178    BMET  Recent Labs  09/28/15 0330 09/29/15 0705  NA 139 142  K 4.0 4.4  CL 96* 102  CO2 31 29  GLUCOSE 197* 225*  BUN 84* 78*  CREATININE 3.60* 3.65*  CALCIUM 7.6* 7.2*     Medications: I have reviewed the patient's current medications.  Assessment/Plan:  1. Left retroperitoneal mass, adrenal masses, left testicular mass, status post a CT-guided biopsy of the left retroperitoneal mass 09/08/2015-pathology confirmed large B-cell lymphoma, CD20 positive  Staging PET scan 09/20/2015 with extensive lymphoma involving the abdomen, adrenal glands, and left kidney  Cycle 1 R-CHOP 09/22/2015 2. Renal failure, left hydronephrosis; left ureter stent placed 09/18/2015. Improved. 3. Left hemiplegia-chronic following a CVA 4. Microcytic anemia secondary to chronic disease, renal failure, surgery, and multiple phlebotomies 5. Fever at presentation with non-Hodgkin's lymphoma-resolved on Decadron, Decadron discontinued 09/13/2015 6. Rash/pruritus 09/14/2015-? Contact dermatitis versus drug rash 7. Status post left orchiectomy and placement of a left ureter stent 09/18/2015. Pathology revealed involvement with large B-cell  lymphoma. 8. High fever, hypotension, and altered mental status 09/29/2015-probable sepsis syndrome  Carlos Black is unresponsive this morning with a high fever and hypotension. He most likely has bacterial sepsis. We called the rapid response team and he was intubated and transferred to the intensive care unit. The critical care service will consult. I am available to discuss the situation with his family as needed on 09/29/2015.  I will follow-up on the CBC from this morning and decide on continuing G-CSF.  Recommendations: 1. Management of respiratory failure/sepsis syndrome per critical care medicine 2. I will follow-up on the CBC from today and decide on continuing G-CSF 3. Please call Oncology over the weekend as needed   Addendum: He has developed severe neutropenia at day 8 following R-CHOP chemotherapy. He will continue G-CSF at an increased dose.   LOS: 10 days   Carlos Black  09/29/2015, 8:18 AM

## 2015-09-30 ENCOUNTER — Encounter (HOSPITAL_COMMUNITY): Payer: Self-pay | Admitting: Radiology

## 2015-09-30 ENCOUNTER — Inpatient Hospital Stay (HOSPITAL_COMMUNITY): Payer: Medicare HMO

## 2015-09-30 DIAGNOSIS — C864 Blastic NK-cell lymphoma: Secondary | ICD-10-CM

## 2015-09-30 DIAGNOSIS — I959 Hypotension, unspecified: Secondary | ICD-10-CM

## 2015-09-30 DIAGNOSIS — Z515 Encounter for palliative care: Secondary | ICD-10-CM

## 2015-09-30 DIAGNOSIS — D709 Neutropenia, unspecified: Secondary | ICD-10-CM

## 2015-09-30 DIAGNOSIS — R100 Acute abdomen: Secondary | ICD-10-CM

## 2015-09-30 DIAGNOSIS — D701 Agranulocytosis secondary to cancer chemotherapy: Secondary | ICD-10-CM

## 2015-09-30 DIAGNOSIS — A4189 Other specified sepsis: Secondary | ICD-10-CM

## 2015-09-30 DIAGNOSIS — R109 Unspecified abdominal pain: Secondary | ICD-10-CM | POA: Insufficient documentation

## 2015-09-30 DIAGNOSIS — R5081 Fever presenting with conditions classified elsewhere: Secondary | ICD-10-CM

## 2015-09-30 LAB — BLOOD GAS, ARTERIAL
ACID-BASE DEFICIT: 13.2 mmol/L — AB (ref 0.0–2.0)
ACID-BASE DEFICIT: 12.4 mmol/L — AB (ref 0.0–2.0)
BICARBONATE: 11.9 meq/L — AB (ref 20.0–24.0)
Bicarbonate: 10.8 mEq/L — ABNORMAL LOW (ref 20.0–24.0)
DRAWN BY: 236041
Drawn by: 331471
FIO2: 0.3
FIO2: 0.3
LHR: 18 {breaths}/min
MECHVT: 530 mL
O2 Saturation: 98.1 %
O2 Saturation: 96.6 %
PATIENT TEMPERATURE: 98.1
PCO2 ART: 19.5 mmHg — AB (ref 35.0–45.0)
PEEP/CPAP: 5 cmH2O
PEEP: 5 cmH2O
PH ART: 7.362 (ref 7.350–7.450)
PO2 ART: 129 mmHg — AB (ref 80.0–100.0)
Patient temperature: 98.6
RATE: 14 resp/min
TCO2: 10.4 mmol/L (ref 0–100)
TCO2: 11.5 mmol/L (ref 0–100)
VT: 530 mL
pCO2 arterial: 22.4 mmHg — ABNORMAL LOW (ref 35.0–45.0)
pH, Arterial: 7.344 — ABNORMAL LOW (ref 7.350–7.450)
pO2, Arterial: 107 mmHg — ABNORMAL HIGH (ref 80.0–100.0)

## 2015-09-30 LAB — BASIC METABOLIC PANEL
ANION GAP: 22 — AB (ref 5–15)
Anion gap: 21 — ABNORMAL HIGH (ref 5–15)
BUN: 76 mg/dL — AB (ref 6–20)
BUN: 80 mg/dL — ABNORMAL HIGH (ref 6–20)
CALCIUM: 6.2 mg/dL — AB (ref 8.9–10.3)
CHLORIDE: 100 mmol/L — AB (ref 101–111)
CO2: 14 mmol/L — ABNORMAL LOW (ref 22–32)
CO2: 15 mmol/L — AB (ref 22–32)
Calcium: 6 mg/dL — CL (ref 8.9–10.3)
Chloride: 103 mmol/L (ref 101–111)
Creatinine, Ser: 3.99 mg/dL — ABNORMAL HIGH (ref 0.61–1.24)
Creatinine, Ser: 4.37 mg/dL — ABNORMAL HIGH (ref 0.61–1.24)
GFR calc Af Amer: 15 mL/min — ABNORMAL LOW (ref >60)
GFR, EST AFRICAN AMERICAN: 17 mL/min — AB (ref >60)
GFR, EST NON AFRICAN AMERICAN: 14 mL/min — AB (ref >60)
GFR, EST NON AFRICAN AMERICAN: 13 mL/min — AB (ref >60)
GLUCOSE: 238 mg/dL — AB (ref 65–99)
Glucose, Bld: 377 mg/dL — ABNORMAL HIGH (ref 65–99)
POTASSIUM: 5.7 mmol/L — AB (ref 3.5–5.1)
Potassium: 5.8 mmol/L — ABNORMAL HIGH (ref 3.5–5.1)
SODIUM: 138 mmol/L (ref 135–145)
Sodium: 137 mmol/L (ref 135–145)

## 2015-09-30 LAB — CBC WITH DIFFERENTIAL/PLATELET
BASOS PCT: 0 %
Basophils Absolute: 0 10*3/uL (ref 0.0–0.1)
EOS PCT: 0 %
Eosinophils Absolute: 0 10*3/uL (ref 0.0–0.7)
HEMATOCRIT: 25.5 % — AB (ref 39.0–52.0)
HEMOGLOBIN: 7.8 g/dL — AB (ref 13.0–17.0)
LYMPHS PCT: 39 %
Lymphs Abs: 0.2 10*3/uL — ABNORMAL LOW (ref 0.7–4.0)
MCH: 24.4 pg — AB (ref 26.0–34.0)
MCHC: 30.6 g/dL (ref 30.0–36.0)
MCV: 79.7 fL (ref 78.0–100.0)
MONOS PCT: 47 %
Monocytes Absolute: 0.1 10*3/uL (ref 0.1–1.0)
NEUTROS ABS: 0 10*3/uL — AB (ref 1.7–7.7)
NEUTROS PCT: 14 %
Platelets: 182 10*3/uL (ref 150–400)
RBC: 3.2 MIL/uL — ABNORMAL LOW (ref 4.22–5.81)
RDW: 18.3 % — ABNORMAL HIGH (ref 11.5–15.5)
WBC: 0.3 10*3/uL — CL (ref 4.0–10.5)

## 2015-09-30 LAB — LACTIC ACID, PLASMA: Lactic Acid, Venous: 10.2 mmol/L (ref 0.5–2.0)

## 2015-09-30 LAB — GLUCOSE, CAPILLARY
GLUCOSE-CAPILLARY: 268 mg/dL — AB (ref 65–99)
GLUCOSE-CAPILLARY: 217 mg/dL — AB (ref 65–99)
GLUCOSE-CAPILLARY: 204 mg/dL — AB (ref 65–99)
Glucose-Capillary: 329 mg/dL — ABNORMAL HIGH (ref 65–99)

## 2015-09-30 LAB — MAGNESIUM: Magnesium: 1.3 mg/dL — ABNORMAL LOW (ref 1.7–2.4)

## 2015-09-30 LAB — PROCALCITONIN: Procalcitonin: >175 ng/mL

## 2015-09-30 LAB — APTT: APTT: 43 s — AB (ref 24–37)

## 2015-09-30 LAB — URIC ACID: Uric Acid, Serum: 5 mg/dL (ref 4.4–7.6)

## 2015-09-30 LAB — PROTIME-INR
INR: 2.08 — AB (ref 0.00–1.49)
INR: 2.29 — AB (ref 0.00–1.49)
PROTHROMBIN TIME: 23.2 s — AB (ref 11.6–15.2)
Prothrombin Time: 25 seconds — ABNORMAL HIGH (ref 11.6–15.2)

## 2015-09-30 LAB — PHOSPHORUS
PHOSPHORUS: 7.8 mg/dL — AB (ref 2.5–4.6)
Phosphorus: 7.7 mg/dL — ABNORMAL HIGH (ref 2.5–4.6)

## 2015-09-30 LAB — LACTATE DEHYDROGENASE: LDH: 548 U/L — ABNORMAL HIGH (ref 98–192)

## 2015-09-30 MED ORDER — CHLORHEXIDINE GLUCONATE 0.12% ORAL RINSE (MEDLINE KIT)
15.0000 mL | Freq: Two times a day (BID) | OROMUCOSAL | Status: DC
Start: 2015-09-30 — End: 2015-10-01
  Administered 2015-09-30 (×3): 15 mL via OROMUCOSAL

## 2015-09-30 MED ORDER — SODIUM POLYSTYRENE SULFONATE 15 GM/60ML PO SUSP
30.0000 g | Freq: Once | ORAL | Status: AC
  Administered 2015-10-01: 30 g via RECTAL
  Filled 2015-09-30: qty 120

## 2015-09-30 MED ORDER — ANTISEPTIC ORAL RINSE SOLUTION (CORINZ)
7.0000 mL | Freq: Four times a day (QID) | OROMUCOSAL | Status: DC
Start: 2015-09-30 — End: 2015-10-01
  Administered 2015-09-30 – 2015-10-01 (×5): 7 mL via OROMUCOSAL

## 2015-09-30 MED ORDER — TBO-FILGRASTIM 480 MCG/0.8ML ~~LOC~~ SOSY
480.0000 ug | PREFILLED_SYRINGE | Freq: Every day | SUBCUTANEOUS | Status: DC
  Administered 2015-09-30: 480 ug via SUBCUTANEOUS
  Filled 2015-09-30 (×2): qty 0.8

## 2015-09-30 MED ORDER — MAGNESIUM SULFATE 2 GM/50ML IV SOLN
2.0000 g | Freq: Once | INTRAVENOUS | Status: AC
  Administered 2015-09-30: 2 g via INTRAVENOUS
  Filled 2015-09-30: qty 50

## 2015-09-30 MED ORDER — SODIUM BICARBONATE 8.4 % IV SOLN
INTRAVENOUS | Status: DC
  Administered 2015-09-30 – 2015-10-01 (×2): via INTRAVENOUS
  Filled 2015-09-30 (×2): qty 150

## 2015-09-30 MED ORDER — PIPERACILLIN-TAZOBACTAM IN DEX 2-0.25 GM/50ML IV SOLN
2.2500 g | Freq: Four times a day (QID) | INTRAVENOUS | Status: DC
  Administered 2015-09-30 – 2015-10-01 (×4): 2.25 g via INTRAVENOUS
  Filled 2015-09-30 (×6): qty 50

## 2015-09-30 MED ORDER — IOHEXOL 300 MG/ML  SOLN: 50.0000 mL | Freq: Once | INTRAMUSCULAR | Status: DC | PRN

## 2015-09-30 NOTE — Progress Notes (Signed)
Mar[facility] Kitchen   HEMATOLOGY/ONCOLOGY INPATIENT PROGRESS NOTE  Date of Service: 09/30/2015  Inpatient Attending: .Juanito Doom, MD   SUBJECTIVE  Patient was seen in the ICU. Remains intubated and critically ill on pressors. On multiple antibiotics for severe sepsis. Remains neutropenic on daily Neupogen shots. Patient's family and wife appear quite overwhelmed with the course of the patient and are wondering if that is any hope of recovery and when they should choose to withhold aggressive cares. I spent significant amount of time with the family to give them a sense of erythema stands right now. I suggested that we will have the palliative care team involved for ongoing goals of care discussion. They are okay with concerning the CT scan of the abdomen. I mentioned that we will have a good sense about how he is recovering in the next couple of days over the weekend and will then need to regroup to define additional goals of care.   OBJECTIVE:  NAD  PHYSICAL EXAMINATION: . Filed Vitals:   09/30/15 0800 09/30/15 0837 09/30/15 0900 09/30/15 1000  Pulse: 94 97 98 101  Temp:      TempSrc:      Resp: 30 30 23 25   Height:      Weight:      SpO2: 97% 100% 98% 98%   Filed Weights   09/28/15 0559 09/29/15 0600 09/30/15 0506  Weight: 193 lb (87.544 kg) 191 lb 3.2 oz (86.728 kg) 196 lb 10.4 oz (89.2 kg)   .Body mass index is 32.72 kg/(m^2).  GENERAL: Intubated on a breathing machine with multiple pressors and IV antibiotics EYES: Closed pupils reactive to light  OROPHARYNX: Endotracheal tube in situ NECK: supple, no JVD LYMPH:  no palpable lymphadenopathy in the cervical, axillary areas LUNGS: Bilateral basal rales  HEART: S1 and S2 tachycardic  ABDOMEN: abdomen distended with hyperactive bowel sounds Musculoskeletal: Bilateral 1+ pedal pedal edema PSYCH: alert & oriented x 3 with fluent speech NEURO: Sedated on breathing machine   MEDICAL HISTORY:  Past Medical History  Diagnosis Date    . Hypertension   . Coronary artery disease   . Myocardial infarction ([place])   . CAD (coronary artery disease) 09/05/2015  . SIRS (systemic inflammatory response syndrome) ([place]) 09/06/2015  . Acute renal failure (ARF) ([place]) 09/06/2015  . Type II diabetes mellitus ([place])   . Stroke [facility]) 2004    w/left sided weakness/notes 09/05/2015    SURGICAL HISTORY: Past Surgical History  Procedure Laterality Date  . Basilar arterty stent  07/25/2004     Dr. Abel Presto Deveshwar/notes 02/01/2011  . Orchiectomy Left 09/18/2015    Procedure: LEFT RADICAL ORCHIECTOMY;  Surgeon: Kathie Rhodes, MD;  Location: [facility] ORS;  Service: Urology;  Laterality: Left;  . Cystoscopy w/ ureteral stent placement Left 09/18/2015    Procedure: CYSTOSCOPY WITH LEFT URETERAL DILATION, LEFT RETROGRADE PYELOGRAM/URETERAL STENT PLACEMENT;  Surgeon: Kathie Rhodes, MD;  Location: [facility] ORS;  Service: Urology;  Laterality: Left;    SOCIAL HISTORY: Social History   Social History  . Marital Status: Married    Spouse Name: N/A  . Number of Children: N/A  . Years of Education: N/A   Occupational History  . Not on file.   Social History Main Topics  . Smoking status: Never Smoker   . Smokeless tobacco: Never Used  . Alcohol Use: No  . Drug Use: No  . Sexual Activity: Not Currently   Other Topics Concern  . Not on file   Social History Narrative  FAMILY HISTORY: Family History  Problem Relation Age of Onset  . Diabetes Mellitus II Mother   . Pneumonia Father   . Diabetes Father     ALLERGIES:  has No Known Allergies.  MEDICATIONS:  Scheduled Meds: . anidulafungin  100 mg Intravenous Q24H  . antiseptic oral rinse  7 mL Mouth Rinse QID  . aspirin  81 mg Oral Daily  . chlorhexidine gluconate  15 mL Mouth Rinse BID  . enoxaparin (LOVENOX) injection  30 mg Subcutaneous Q24H  . feeding supplement (GLUCERNA SHAKE)  237 mL Oral BID BM  . hydrocortisone sod succinate (SOLU-CORTEF) inj  50 mg Intravenous 4 times per day   . insulin aspart  0-15 Units Subcutaneous TID WC  . insulin aspart  0-5 Units Subcutaneous QHS  . nutrition supplement (JUVEN)  1 packet Oral BID BM  . pantoprazole (PROTONIX) IV  40 mg Intravenous Daily  . piperacillin-tazobactam (ZOSYN)  IV  2.25 g Intravenous 4 times per day  . sodium chloride  10-40 mL Intracatheter Q12H   Continuous Infusions: . sodium chloride 75 mL/hr at 09/30/15 0600  . epinephrine Stopped (09/30/15 0830)  . norepinephrine (LEVOPHED) Adult infusion 35 mcg/min (09/30/15 1000)  . vasopressin (PITRESSIN) infusion - *FOR SHOCK* 0.03 Units/min (09/30/15 0729)   PRN Meds:.sodium chloride, acetaminophen **OR** acetaminophen, albuterol, alteplase, dextrose, diphenhydrAMINE, diphenhydrAMINE, EPINEPHrine, EPINEPHrine, EPINEPHrine, EPINEPHrine, famotidine, fentaNYL (SUBLIMAZE) injection, fentaNYL (SUBLIMAZE) injection, fludeoxyglucose F - 18, heparin lock flush, heparin lock flush, methylPREDNISolone sodium succinate, midazolam, midazolam, ondansetron **OR** ondansetron (ZOFRAN) IV, oxyCODONE, polyethylene glycol, sodium chloride, sodium chloride, sodium chloride  REVIEW OF SYSTEMS:    10 Point review of Systems was done is negative except as noted above.   LABORATORY DATA:  I have reviewed the data as listed  . CBC Latest Ref Rng 09/30/2015 09/29/2015 09/28/2015  WBC 4.0 - 10.5 K/uL 0.3(LL) 0.3(LL) 5.2  Hemoglobin 13.0 - 17.0 g/dL 7.8(L) 8.0(L) 8.5(L)  Hematocrit 39.0 - 52.0 % 25.5(L) 24.8(L) 26.2(L)  Platelets 150 - 400 K/uL 182 178 298    . CMP Latest Ref Rng 09/30/2015 09/29/2015 09/28/2015  Glucose 65 - 99 mg/dL 377(H) 225(H) 197(H)  BUN 6 - 20 mg/dL 76(H) 78(H) 84(H)  Creatinine 0.61 - 1.24 mg/dL 3.99(H) 3.65(H) 3.60(H)  Sodium 135 - 145 mmol/L 138 142 139  Potassium 3.5 - 5.1 mmol/L 5.8(H) 4.4 4.0  Chloride 101 - 111 mmol/L 103 102 96(L)  CO2 22 - 32 mmol/L 14(L) 29 31  Calcium 8.9 - 10.3 mg/dL 6.2(LL) 7.2(L) 7.6(L)  Total Protein 6.5 - 8.1 g/dL - - -  Total  Bilirubin 0.3 - 1.2 mg/dL - - -  Alkaline Phos 38 - 126 U/L - - -  AST 15 - 41 U/L - - -  ALT 17 - 63 U/L - - -     RADIOGRAPHIC STUDIES: I have personally reviewed the radiological images as listed and agreed with the findings in the report. Ct Abdomen Pelvis Wo Contrast  09/07/2015  CLINICAL DATA:  Evaluate retroperitoneal mass seen on recent MRI of the lumbar spine. EXAM: CT CHEST, ABDOMEN AND PELVIS WITHOUT CONTRAST TECHNIQUE: Multidetector CT imaging of the chest, abdomen and pelvis was performed following the standard protocol without IV contrast. COMPARISON:  Lumbar spine MRI 09/06/2015 and renal ultrasound 09/06/2015 FINDINGS: CT CHEST FINDINGS Mediastinum/Lymph Nodes: No chest wall mass, supraclavicular or axillary lymphadenopathy. Small scattered lymph nodes are noted. The thyroid gland is grossly normal. The heart is normal in size. No pericardial effusion. The aorta is  normal in caliber. Mild tortuosity and scattered atherosclerotic calcifications. There are extensive 3 vessel coronary artery calcifications. Moderate distal esophageal wall thickening. Could not exclude neoplasm or esophagitis. No paraesophageal lymphadenopathy. Lungs/Pleura: Limited examination due to breathing motion artifact. There is a small left pleural effusion and areas of atelectasis. Rounded pleural density noted posteriorly at the right lung apex measures -45 Hounsfield units and is probably a small benign pleural lipoma. No worrisome pulmonary lesions to suggest a primary lung neoplasm or metastatic disease. Musculoskeletal: No significant bony findings. CT ABDOMEN PELVIS FINDINGS Hepatobiliary: No obvious hepatic lesions without contrast. Mild hepatomegaly. The gallbladder is distended and there are small gallstones noted. Pancreas: No pancreatic mass, inflammation or ductal dilatation. Spleen: Upper limits of normal.  No focal lesions. Adrenals/Urinary Tract: Bilateral adrenal gland masses. The right measures a  maximum of 37.5 mm and the left 31 mm. No obvious renal mass. There is left-sided hydronephrosis due to a large left-sided retroperitoneal mass which is surrounding the left ureter and lower pole region of the left kidney. I do not see a fat plane between this mass and the aorta. It extends down into the upper left pelvis. This could reflect aggressive lymphoma or retroperitoneal sarcoma. The mass measures approximately 16.4 x 9.7 x 8.7 cm. Recommend image guided biopsy. PET-CT may also be helpful for further evaluation and accurate staging. Stomach/Bowel: The stomach, duodenum, small bowel and colon are grossly normal. No inflammatory changes, mass lesions or obstructive findings. The terminal ileum is normal. Vascular/Lymphatic: Large left-sided retroperitoneal mass as discussed above. Moderate atherosclerotic calcifications involving the aorta but no aneurysm. Reproductive: The bladder, prostate gland and seminal vesicles are unremarkable. Other: 3.6 x 2.1 cm internal iliac nodal mass on the left side. Small amount of free the pelvic fluid and presacral edema. Bilateral inguinal hernias containing fat and fluid and small bowel on the right side. Suspect undescended testicle in the left inguinal canal. This would raise the possibility of testicular cancer. No inguinal adenopathy. Musculoskeletal: No significant bony findings. IMPRESSION: 1. Bulky left-sided retroperitoneal mass which is obstructing the left ureter and causing left-sided hydronephrosis. There are also bilateral adrenal gland masses. Findings could be due to aggressive lymphoma, retroperitoneal sarcoma or possibly testicular carcinoma given the fact that it appears the patients left testicle is in the left inguinal canal. No obvious testicular mass. Scrotal ultrasound may be helpful for further evaluation. Image guided Biopsy is recommended of the left retroperitoneal mass. 2. No significant findings in the chest. No evidence of a primary lung  neoplasm or metastatic pulmonary disease. No mediastinal or hilar mass or adenopathy. 3. Extensive 3 vessel coronary artery calcifications. 4. Small left pleural effusion. 5. Distended gallbladder and cholelithiasis. 6. Bilateral inguinal hernias containing fat, fluid and small bowel on the right side. Electronically Signed   By: Marijo Sanes M.D.   On: 09/07/2015 13:35   Dg Chest 2 View  09/28/2015  CLINICAL DATA:  Fever. Systemic inflammatory response syndrome. Non-Hodgkin's B-cell lymphoma. EXAM: CHEST  2 VIEW COMPARISON:  09/08/2015 FINDINGS: A new right arm PICC line is seen with tip overlying the proximal right atrium approximately 1.5 cm below the superior cavoatrial junction. Low lung volumes are noted. Increased size of small left pleural effusion and left lower lobe atelectasis demonstrated. Mild right basilar atelectasis also noted. No pneumothorax visualized. IMPRESSION: New right arm PICC line tip overlies the proximal right atrium. Decreased lung volumes with increased left pleural effusion and left lower lobe atelectasis. Mild right basilar atelectasis also noted.  Electronically Signed   By: Earle Gell M.D.   On: 09/28/2015 15:04   Dg Chest 2 View  09/17/2015  CLINICAL DATA:  Weakness and hypoxia EXAM: CHEST  2 VIEW COMPARISON:  Chest x-rays dated 09/18/2015 and 09/06/2015. FINDINGS: Cardiomediastinal silhouette remains normal in size and configuration, heart size is upper normal. Small layering left pleural effusion appears stable. Elevation of the right hemidiaphragm is unchanged, with mild overlying atelectasis. No new lung findings seen. No acute osseous abnormality. IMPRESSION: Stable chest x-ray.  Stable small layering left pleural effusion. Electronically Signed   By: Franki Cabot M.D.   On: 09/13/2015 14:55   Dg Chest 2 View  09/18/2015  CLINICAL DATA:  Preop orchiectomy EXAM: CHEST  2 VIEW COMPARISON:  CT chest dated 09/07/2015 FINDINGS: Small layering left pleural effusion.  Mild right basilar atelectasis. No pneumothorax. The heart is top-normal in size. IMPRESSION: Small layering left pleural effusion. Electronically Signed   By: Julian Hy M.D.   On: 09/18/2015 12:24   Dg Chest 2 View  09/05/2015  CLINICAL DATA:  64 year old with acute onset of left lower extremity paresis/numbness which began earlier today. Fever. Current history of hypertension and diabetes. Prior stroke and MI. EXAM: CHEST  2 VIEW COMPARISON:  07/26/2004, 07/20/2004. FINDINGS: AP semi-erect and lateral images were obtained. Chronic elevation of the right hemidiaphragm with chronic scar/atelectasis involving the right lower lobe and right middle lobe, unchanged. Lungs otherwise clear. No localized airspace consolidation. No pleural effusions. No pneumothorax. Normal pulmonary vascularity. Cardiac silhouette upper normal in size for AP technique. Thoracic aorta mildly atherosclerotic and tortuous, unchanged. Hilar and mediastinal contours otherwise unremarkable. Visualized bony thorax intact with only mild degenerative changes in the mid thoracic region. IMPRESSION: 1.  No acute cardiopulmonary disease. 2. Stable chronic elevation of the right hemidiaphragm and chronic scar/atelectasis involving the right lower lobe and right middle lobe. Electronically Signed   By: Evangeline Dakin M.D.   On: 09/05/2015 15:26   Ct Head Wo Contrast  09/20/2015  CLINICAL DATA:  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 Confusion. EXAM: CT HEAD WITHOUT CONTRAST TECHNIQUE: Contiguous axial images were obtained from the base of the skull through the vertex without contrast. COMPARISON:  MRI 09/06/2015 and head CT 09/05/2015 FINDINGS: No evidence for acute hemorrhage, mass lesion, midline shift, hydrocephalus or large infarct. Subtle low-density in the periventricular white matter appears unchanged. There is an old lacune infarct in the anterior limb of the right internal  capsule. Visualized paranasal sinuses and mastoid air cells are clear. No acute bone abnormality. No suspicious bony lesions. IMPRESSION: No acute intracranial abnormality. Subtle white matter changes suggest chronic small vessel ischemic disease. Old lacune in the right internal capsule. Electronically Signed   By: Markus Daft M.D.   On: 09/20/2015 15:58   Ct Head Wo Contrast  09/05/2015  CLINICAL DATA:  Left-sided weakness from previous CVA, weakness has increased over the last day with difficulty walking EXAM: CT HEAD WITHOUT CONTRAST TECHNIQUE: Contiguous axial images were obtained from the base of the skull through the vertex without intravenous contrast. COMPARISON:  12/21/2011 FINDINGS: The bony calvarium is intact. Atrophic changes are noted. No findings to suggest acute hemorrhage, acute infarction or space-occupying mass lesion are noted. IMPRESSION: Atrophic changes without acute abnormality. Electronically Signed   By: Inez Catalina M.D.   On: 09/05/2015 16:37   Ct Chest Wo Contrast  09/07/2015  CLINICAL DATA:  Evaluate retroperitoneal mass seen on recent MRI of  the lumbar spine. EXAM: CT CHEST, ABDOMEN AND PELVIS WITHOUT CONTRAST TECHNIQUE: Multidetector CT imaging of the chest, abdomen and pelvis was performed following the standard protocol without IV contrast. COMPARISON:  Lumbar spine MRI 09/06/2015 and renal ultrasound 09/06/2015 FINDINGS: CT CHEST FINDINGS Mediastinum/Lymph Nodes: No chest wall mass, supraclavicular or axillary lymphadenopathy. Small scattered lymph nodes are noted. The thyroid gland is grossly normal. The heart is normal in size. No pericardial effusion. The aorta is normal in caliber. Mild tortuosity and scattered atherosclerotic calcifications. There are extensive 3 vessel coronary artery calcifications. Moderate distal esophageal wall thickening. Could not exclude neoplasm or esophagitis. No paraesophageal lymphadenopathy. Lungs/Pleura: Limited examination due to  breathing motion artifact. There is a small left pleural effusion and areas of atelectasis. Rounded pleural density noted posteriorly at the right lung apex measures -45 Hounsfield units and is probably a small benign pleural lipoma. No worrisome pulmonary lesions to suggest a primary lung neoplasm or metastatic disease. Musculoskeletal: No significant bony findings. CT ABDOMEN PELVIS FINDINGS Hepatobiliary: No obvious hepatic lesions without contrast. Mild hepatomegaly. The gallbladder is distended and there are small gallstones noted. Pancreas: No pancreatic mass, inflammation or ductal dilatation. Spleen: Upper limits of normal.  No focal lesions. Adrenals/Urinary Tract: Bilateral adrenal gland masses. The right measures a maximum of 37.5 mm and the left 31 mm. No obvious renal mass. There is left-sided hydronephrosis due to a large left-sided retroperitoneal mass which is surrounding the left ureter and lower pole region of the left kidney. I do not see a fat plane between this mass and the aorta. It extends down into the upper left pelvis. This could reflect aggressive lymphoma or retroperitoneal sarcoma. The mass measures approximately 16.4 x 9.7 x 8.7 cm. Recommend image guided biopsy. PET-CT may also be helpful for further evaluation and accurate staging. Stomach/Bowel: The stomach, duodenum, small bowel and colon are grossly normal. No inflammatory changes, mass lesions or obstructive findings. The terminal ileum is normal. Vascular/Lymphatic: Large left-sided retroperitoneal mass as discussed above. Moderate atherosclerotic calcifications involving the aorta but no aneurysm. Reproductive: The bladder, prostate gland and seminal vesicles are unremarkable. Other: 3.6 x 2.1 cm internal iliac nodal mass on the left side. Small amount of free the pelvic fluid and presacral edema. Bilateral inguinal hernias containing fat and fluid and small bowel on the right side. Suspect undescended testicle in the left  inguinal canal. This would raise the possibility of testicular cancer. No inguinal adenopathy. Musculoskeletal: No significant bony findings. IMPRESSION: 1. Bulky left-sided retroperitoneal mass which is obstructing the left ureter and causing left-sided hydronephrosis. There are also bilateral adrenal gland masses. Findings could be due to aggressive lymphoma, retroperitoneal sarcoma or possibly testicular carcinoma given the fact that it appears the patients left testicle is in the left inguinal canal. No obvious testicular mass. Scrotal ultrasound may be helpful for further evaluation. Image guided Biopsy is recommended of the left retroperitoneal mass. 2. No significant findings in the chest. No evidence of a primary lung neoplasm or metastatic pulmonary disease. No mediastinal or hilar mass or adenopathy. 3. Extensive 3 vessel coronary artery calcifications. 4. Small left pleural effusion. 5. Distended gallbladder and cholelithiasis. 6. Bilateral inguinal hernias containing fat, fluid and small bowel on the right side. Electronically Signed   By: Marijo Sanes M.D.   On: 09/07/2015 13:35   Mr Brain Wo Contrast  09/06/2015  CLINICAL DATA:  LEFT lower extremity numbness and LEFT-sided weakness for 2 days. Night cough for 2-3 weeks. History of stroke and LEFT-sided  weakness, diabetes, hypertension, coronary artery disease. EXAM: MRI HEAD WITHOUT CONTRAST TECHNIQUE: Multiplanar, multiecho pulse sequences of the brain and surrounding structures were obtained without intravenous contrast. COMPARISON:  CT head September 05, 2015 at 1626 hours FINDINGS: Multiple sequences are mild or moderately motion degraded. No reduced diffusion to suggest acute ischemia. No susceptibility artifact to suggest hemorrhage. Ventricles and sulci are normal for patient's age. Old RIGHT ventral pons infarct. Old RIGHT basal ganglia lacunar infarct. Mild RIGHT cerebral peduncle volume loss compatible with wallerian degeneration. Mild  white matter changes compatible with chronic small vessel ischemic disease, decreased sensitivity due to patient motion. No abnormal extra-axial fluid collections. Dolicoectatic intracranial vessel flow voids seen at the skull base. Ocular globes and orbital contents are grossly normal though motion degraded. Paranasal sinuses and mastoid air cells are well aerated. No abnormal sellar expansion. No is abnormal cerebellar tonsillar descent below the foramen magnum. No suspicious bone marrow signal. IMPRESSION: No acute intracranial process on this motion degraded examination. Involutional changes and mild chronic small vessel ischemic disease. Old RIGHT pontine infarct. Old RIGHT basal ganglia lacunar infarct. Electronically Signed   By: Elon Alas M.D.   On: 09/06/2015 02:55   Mr Lumbar Spine Wo Contrast  09/06/2015  CLINICAL DATA:  LEFT lower extremity numbness and LEFT-sided weakness for 2 days. History of stroke in LEFT-sided weakness, diabetes, hypertension, coronary artery disease. EXAM: MRI LUMBAR SPINE WITHOUT CONTRAST TECHNIQUE: Multiplanar, multisequence MR imaging of the lumbar spine was performed. No intravenous contrast was administered. COMPARISON:  None. FINDINGS: Lumbar vertebral bodies and posterior elements are intact and aligned with maintenance of lumbar lordosis. Intervertebral discs demonstrate normal morphology, with slight decreased T2 signal within the lower lumbar disc consistent with mild desiccation. Minimal subacute discogenic endplate changes at all lumbar levels, no STIR signal abnormality to suggest acute osseous process. Conus medullaris terminates at L1 and appears normal in morphology and signal characteristics. Limited assessment of cauda equina due to patient motion. LEFT abdominal mass partially imaged, with mass effect on the LEFT psoas muscle. Probable lymphadenopathy along LEFT Common iliac chain. Probable obstructive uropathy on the LEFT. Partially imaged sub cm  gallstone. Level by level evaluation: L1-2 and L2-3: No significant disc bulge, canal stenosis or neural foraminal narrowing. L3-4: Small broad-based disc bulge, mild facet and ligamentum flavum redundancy without canal stenosis. Minimal bilateral neural foraminal narrowing. L4-5: Small broad-based disc bulge asymmetric to LEFT. Mild facet arthropathy and ligamentum flavum redundancy without canal stenosis. Minimal LEFT neural foraminal narrowing. L5-S1: No disc bulge, canal stenosis nor neural foraminal narrowing. IMPRESSION: No acute lumbar spine fracture nor malalignment. No canal stenosis. Minimal L3-4 and L4-5 neural foraminal narrowing. Large LEFT retroperitoneal mass with mass effect on the LEFT psoas muscle and probable lymphadenopathy along LEFT iliac chain. Recommend CT of the abdomen and pelvis with contrast. Mass appears to result in LEFT obstructive uropathy. Electronically Signed   By: Elon Alas M.D.   On: 09/06/2015 23:18   US Scrotum  09/07/2015  CLINICAL DATA:  64 year old male with palpable testicular mass. EXAM: ULTRASOUND OF SCROTUM TECHNIQUE: Complete ultrasound examination of the testicles, epididymis, and other scrotal structures was performed. COMPARISON:  None. FINDINGS: Right testicle Measurements: 4.5 x 2.6 x 2 cm. No mass or microlithiasis visualized. Left testicle Measurements: 4.4 x 3.4 x 2.9 cm. A 2.2 x 3.3 x 2.3 cm oval hypoechoic area within the left testicle is noted with some vascular flow. No other abnormalities identified. Right epididymis:  Not visualized Left epididymis:  Normal  in size and appearance. Hydrocele:  Small - moderate bilateral hydroceles noted. Varicocele:  Mild left varicocele noted. IMPRESSION: 2.2 x 3.3 x 2.3 cm oval hypoechoic area within the left testicle suspicious for testicular mass/neoplasm. Urology consultation is recommended. Unremarkable right testicle. Small to moderate bilateral hydroceles. Electronically Signed   By: Margarette Canada M.D.    On: 09/07/2015 20:02   US Renal  09/20/2015  CLINICAL DATA:  Acute kidney injury. EXAM: RENAL / URINARY TRACT ULTRASOUND COMPLETE COMPARISON:  CT 09/07/2015, PET CT 05/21/2015 FINDINGS: Right Kidney: Length: 10.7 cm. Perinephric fluid is identified. No focal renal mass or hydronephrosis identified sonographically. Left Kidney: Length: 11.5 cm. Renal parenchyma is heterogeneous. The lower pole of the kidney appears more hypoechoic and mildly enlarged compared with the remainder of the kidney, consistent with lymphomatous involvement as identified on PET-CT. No hydronephrosis. Bladder: Foley catheter decompresses the bladder. IMPRESSION: 1. No hydronephrosis. 2. Ill-defined hypoechoic lower pole of the left kidney, consistent with lymphoma. Electronically Signed   By: Nolon Nations M.D.   On: 09/20/2015 16:33   US Renal  09/06/2015  CLINICAL DATA:  Acute renal failure. EXAM: RENAL / URINARY TRACT ULTRASOUND COMPLETE COMPARISON:  None. FINDINGS: Right Kidney: Length: 9.6 cm. Heterogeneous increased parenchymal echotexture with parenchymal thinning suggesting chronic medical renal disease. No hydronephrosis or solid mass. Left Kidney: Length: 9.8 cm. Heterogeneous increased parenchymal echotexture likely indicating chronic medical renal disease. Appears to be circumscribed hypoechoic appearance of the lower pole of the left kidney. This may indicate edema versus abscess. No hydronephrosis or solid mass identified. Bladder: No bladder wall thickening or filling defect. Bilateral urine flow jets are demonstrated on color flow Doppler imaging. Incidental note of mildly enlarged prostate gland measuring 3.6 x 3.6 x 3.3 cm. IMPRESSION: Heterogeneous parenchymal echotexture bilaterally suggesting chronic medical renal disease. Hypoechoic appearance of the lower pole left kidney could indicate edema, pyelonephritis, or abscess. No hydronephrosis in either kidney. Mild prostate enlargement. Electronically Signed    By: Lucienne Capers M.D.   On: 09/06/2015 03:19   Nm Pet Image Initial (pi) Skull Base To Thigh  09/20/2015  CLINICAL DATA:  Initial treatment strategy for high-grade B-cell non-Hodgkin's lymphoma. EXAM: NUCLEAR MEDICINE PET SKULL BASE TO THIGH TECHNIQUE: 8.0 mCi F-18 FDG was injected intravenously. Full-ring PET imaging was performed from the skull base to thigh after the radiotracer. CT data was obtained and used for attenuation correction and anatomic localization. FASTING BLOOD GLUCOSE:  Value: 125 mg/dl COMPARISON:  Multiple exams, including 09/07/2015 FINDINGS: NECK No hypermetabolic lymph nodes in the neck. CHEST Pleural-based right upper lobe mass posteriorly measures 2.6 by 1.2 cm on image 14 series 6, and has maximum standard uptake value 14.3. Right hilar lymph node measures approximately 2 cm in short axis and has maximum standard uptake value 19.3. Left infrahilar node is difficult to measure but has maximum standard uptake value of 6.4. Right lower lobe posterolateral pleural-based nodule measures INSERT flank and has maximum standard uptake value 5.3. Moderate size left pleural effusion has faintly increased activity on PET-CT. Coronary atherosclerosis. Small posterior paraspinal lymph node along the anterior margin of the left eleventh rib measures 0.8 cm in short axis on image 93 series 4 and has maximum standard uptake value 11.1. ABDOMEN/PELVIS Hypermetabolic bilateral adrenal masses with extensive abnormal hypermetabolic soft tissue masses within along the left perirenal fascia, involving the left kidney lower pole, and tracking in the retroperitoneum as a large mass encasing part of the abdominal aorta and proximal iliac vasculature, incompletely  encasing the left ureter. On image 137 series 4 this tumor measures 10.7 by 9.7 cm and may be invading the left psoas muscle. A double-J ureteral stent is in place. At the level of the loop of the double-J ureteral catheter, the dominant left  retroperitoneal tumor has a maximum standard uptake value of 24.8. The left adrenal mass measures 5.1 by 4.1 cm on image 104 of series 4 and has a maximum standard uptake value of 25.7. The right adrenal mass measures 5.7 by 3.7 cm on image 105 series 4 and has a maximum standard uptake value of 18.5. Tumor tracking along the left pelvic sidewall has maximum standard uptake value of approximately 22.3. Oval-shaped soft tissue density along the left inguinal hernia measuring 3.0 by 2.0 cm on image 185 series 4 with maximum standard uptake value 23.6. I do not see a separate left testicle and I am suspicious that this may represent a retracted testicle with abnormally high metabolic activity unless the patient has had prior orchectomy. The right testicle seems to have normal activity level, although there is scrotal thickening as well as a right hydrocele within a right inguinal hernia. SKELETON Small focus of muscular activity along the gluteus medius muscle on the left, maximum standard uptake value 6.8. IMPRESSION: 1. Extensive tumor involvement in the abdomen including a very large left retroperitoneal mass encasing the ureter and involving the left kidney and perirenal space; bilateral adrenal malignancy; carry renal tumor deposits on the left ; and either a tumor nodule or more likely tumor involving the left testicle in the left inguinal canal. In the chest, there are hypermetabolic pleural-based nodules as well as hypermetabolic adenopathy. Moderate size left pleural effusion has faintly increased activity on PET-CT and may be malignant. 2. Small focus of muscular activity in the left gluteus medius muscle. Probably malignant. 3. Coronary atherosclerosis. Electronically Signed   By: Van Clines M.D.   On: 09/20/2015 16:06   Ct Biopsy  09/08/2015  CLINICAL DATA:  Left retroperitoneal mass.  Left testicular mass. EXAM: CT-GUIDED BIOPSY LEFT RETROPERITONEAL MASS.  CORE. MEDICATIONS AND MEDICAL HISTORY:  Versed 1 mg, Fentanyl 25 mcg. Additional Medications: None. ANESTHESIA/SEDATION: Moderate sedation time: 6 minutes PROCEDURE: The procedure, risks, benefits, and alternatives were explained to the patient. Questions regarding the procedure were encouraged and answered. The patient understands and consents to the procedure. The back was prepped with ChloraPrep in a sterile fashion, and a sterile drape was applied covering the operative field. A sterile gown and sterile gloves were used for the procedure. Under CT guidance, a(n) 17 gauge guide needle was advanced into the left retroperitoneal mass. Subsequently 3 18 gauge core biopsies were obtained. The guide needle was removed. Final imaging was performed. Patient tolerated the procedure well without complication. Vital sign monitoring by nursing staff during the procedure will continue as patient is in the special procedures unit for post procedure observation. FINDINGS: The images document guide needle placement within the left retroperitoneal mass. Post biopsy images demonstrate no hemorrhage. COMPLICATIONS: None IMPRESSION: Successful CT-guided core biopsy of a left retroperitoneal mass. Electronically Signed   By: Marybelle Killings M.D.   On: 09/08/2015 16:50   Dg Chest Port 1 View  09/29/2015  CLINICAL DATA:  Hypoxia EXAM: PORTABLE CHEST 1 VIEW COMPARISON:  September 28, 2015 FINDINGS: Endotracheal tube tip is 1.9 cm above the carina. Central catheter tip is in the superior vena cava near the cavoatrial junction. No pneumothorax. There is persistent left lower lobe consolidation with small  left effusion. There is mild atelectasis in the right base. The right lung is otherwise clear. Heart is upper normal in size with pulmonary vascularity within normal limits. No adenopathy. IMPRESSION: Tube and catheter positions as described without pneumothorax. Persistent left lower lobe consolidation with small left effusion. Mild right base atelectasis. Electronically Signed    By: Lowella Grip III M.D.   On: 09/29/2015 07:59   Portable Chest 1 View  09/06/2015  CLINICAL DATA:  Fever. EXAM: PORTABLE CHEST 1 VIEW COMPARISON:  09/05/2015.  07/20/2004. FINDINGS: Mediastinum and hilar structures are normal. Heart size stable. Persistent right lower lobe subsegmental atelectasis and/or scarring. No pleural effusion pneumothorax. IMPRESSION: Stable right base subsegmental atelectasis and/or scarring. No acute cardiopulmonary disease. Electronically Signed   By: Marcello Moores  Register   On: 09/06/2015 07:19   Dg C-arm 1-60 Min-no Report  09/18/2015  CLINICAL DATA: lt orchiectomy C-ARM 1-60 MINUTES Fluoroscopy was utilized by the requesting physician.  No radiographic interpretation.    ASSESSMENT & PLAN:    1. Left retroperitoneal mass, adrenal masses, left testicular mass, status post a CT-guided biopsy of the left retroperitoneal mass 09/08/2015-pathology confirmed large B-cell lymphoma, CD20 positive  Staging PET scan 09/20/2015 with extensive lymphoma involving the abdomen, adrenal glands, and left kidney  Cycle 1 R-CHOP 09/22/2015 (Dpse reduced based on renal and liver function)  2. Neutropenia due to chemotherapy with severe sepsis as suggested by a significant elevated procalcitonin level.  Plan -Continue daily high doses of G-CSF until [place] more than 1000 for 2 days. -Continue aggressive IV antibiotics as per critical care team -Discussed with family the patient's concerning overall status at this time. -Patient's wife and family distressed with his current condition. Would recommend palliative care consultation for help with goals of care discussions at this time. I did talk to the family at length and they're okay with evaluation with a CT of the abdomen at this time and would like to see where things go over the next few days as they decide if they would want to continue aggressive cares. We discussed that though his lymphoma is potentially curable his overall  functional status and other medical comorbidities are significantly limiting. They also understand that his neutropenic sepsis could potentially be fatal in the setting of multiorgan dysfunction. -Patient's case was discussed with Dr. Learta Codding yesterday.  3. Renal failure, left hydronephrosis; left ureter stent placed 09/18/2015. Improved. Likely multifactorial in the setting of sepsis, severe hyperglycemia. Cannot rule out some element of tumor lysis syndrome. Plan -Continue management as per critical care team. -Would check a uric acid phosphorus level today -If still hyperuricemic could consider rasburicase IV 3 mg 1 for tumor lysis syndrome.  4. Left hemiplegia-chronic following a CVA 5. Microcytic anemia secondary to chronic disease, renal failure, surgery, and multiple phlebotomies6. Rash/pruritus 09/14/2015-? Contact dermatitis versus drug rash 6. Status post left orchiectomy and placement of a left ureter stent 09/18/2015. Pathology revealed involvement with large B-cell lymphoma. 7. Hypotension requiring multiple pressors likely due to severe sepsis. Given his adrenal involvement could very well also have an element of adrenal insufficiency. -Management per critical care team with pressors. -Might need to consider stress dose steroids in the setting of ongoing hypertension.  I will see the patient tomorrow as needed. Dr. Learta Codding will continue following the patient on Monday. We will likely need to have a family meeting with palliative care, critical care and oncology to define goals of care early next week. I provided update the patient's family today.  I spent 40 minutes counseling the patient face to face. The total time spent in the appointment was 45 minutes and more than 50% was on counseling and direct patient cares.    Sullivan Lone MD [place] AAHIVMS [facility] Hematology/Oncology Physician [facility]  (Office):       [phone] (Work cell):  [phone] (Fax):            [phone]  09/30/2015 10:42 AM

## 2015-09-30 NOTE — Progress Notes (Signed)
[facility] Progress Note Patient Name: Carlos Black DOB: 11/25/1951 MRN: [id number]   Date of Service  09/30/2015  HPI/Events of Note  Abdominal CT reveals a distended gall bladder with some fluid around it.   eICU Interventions  Will order RUQ [place] to further evaluate the gall bladder.      Intervention Category Intermediate Interventions: Diagnostic test evaluation Minor Interventions: Clinical assessment - ordering diagnostic tests  [name],Steven Cornelia Copa 09/30/2015, 7:59 PM

## 2015-09-30 NOTE — Progress Notes (Addendum)
ANTIBIOTIC CONSULT NOTE - Follow-up  Pharmacy Consult for vancomycin, Zosyn Indication: rule out sepsis  No Known Allergies  Patient Measurements: Height: 5\' 5"  (165.1 cm) Weight: 196 lb 10.4 oz (89.2 kg) IBW/kg (Calculated) : 61.5  Vital Signs: Temp: 99 F (37.2 C) (11/05 0506) Temp Source: Axillary (11/05 0506) Pulse Rate: 97 (11/05 0645) Intake/Output from previous day: 11/04 0701 - 11/05 0700 In: 9327.6 [I.V.:4584.2; IV Piggyback:3743.3] Out: 510 [Urine:510] Intake/Output from this shift:    Labs:  Recent Labs  09/28/15 0330 09/29/15 0705 09/30/15 0540  WBC 5.2 0.3* 0.3*  HGB 8.5* 8.0* 7.8*  PLT 298 178 182  CREATININE 3.60* 3.65* 3.99*   Estimated Creatinine Clearance: 18.7 mL/min (by C-G formula based on Cr of 3.99). No results for input(s): VANCOTROUGH, VANCOPEAK, VANCORANDOM, GENTTROUGH, GENTPEAK, GENTRANDOM, TOBRATROUGH, TOBRAPEAK, TOBRARND, AMIKACINPEAK, AMIKACINTROU, AMIKACIN in the last 72 hours.   Microbiology: Recent Results (from the past 720 hour(s))  Blood culture (routine x 2)     Status: None   Collection Time: 09/05/15  3:00 PM  Result Value Ref Range Status   Specimen Description BLOOD RIGHT ARM  Final   Special Requests BOTTLES DRAWN AEROBIC AND ANAEROBIC 5CC  Final   Culture NO GROWTH 5 DAYS  Final   Report Status 09/10/2015 FINAL  Final  Blood culture (routine x 2)     Status: None   Collection Time: 09/05/15  4:43 PM  Result Value Ref Range Status   Specimen Description BLOOD LEFT HAND  Final   Special Requests BOTTLES DRAWN AEROBIC AND ANAEROBIC 4CC  Final   Culture NO GROWTH 5 DAYS  Final   Report Status 09/10/2015 FINAL  Final  MRSA PCR Screening     Status: None   Collection Time: 09/05/15  8:28 PM  Result Value Ref Range Status   MRSA by PCR NEGATIVE NEGATIVE Final    Comment:        The GeneXpert MRSA Assay (FDA approved for NASAL specimens only), is one component of a comprehensive MRSA colonization surveillance program.  It is not intended to diagnose MRSA infection nor to guide or monitor treatment for MRSA infections.   Urine culture     Status: None   Collection Time: 09/05/15 10:20 PM  Result Value Ref Range Status   Specimen Description URINE, RANDOM  Final   Special Requests NONE  Final   Culture 3,000 COLONIES/mL INSIGNIFICANT GROWTH  Final   Report Status 09/07/2015 FINAL  Final  Culture, blood (routine x 2)     Status: None   Collection Time: 09/07/15  9:17 AM  Result Value Ref Range Status   Specimen Description BLOOD RIGHT ANTECUBITAL  Final   Special Requests BOTTLES DRAWN AEROBIC AND ANAEROBIC 5CC 10CC  Final   Culture NO GROWTH 5 DAYS  Final   Report Status 09/12/2015 FINAL  Final  Culture, blood (routine x 2)     Status: None   Collection Time: 09/07/15  9:27 AM  Result Value Ref Range Status   Specimen Description BLOOD RIGHT HAND  Final   Special Requests BOTTLES DRAWN AEROBIC AND ANAEROBIC 10CC  Final   Culture NO GROWTH 5 DAYS  Final   Report Status 09/12/2015 FINAL  Final  C difficile quick scan w PCR reflex     Status: None   Collection Time: 09/13/15  4:00 AM  Result Value Ref Range Status   C Diff antigen NEGATIVE NEGATIVE Final   C Diff toxin NEGATIVE NEGATIVE Final   C Diff  interpretation Negative for toxigenic C. difficile  Final  MRSA PCR Screening     Status: None   Collection Time: 09/20/2015 12:26 PM  Result Value Ref Range Status   MRSA by PCR NEGATIVE NEGATIVE Final    Comment:        The GeneXpert MRSA Assay (FDA approved for NASAL specimens only), is one component of a comprehensive MRSA colonization surveillance program. It is not intended to diagnose MRSA infection nor to guide or monitor treatment for MRSA infections.   Culture, blood (routine x 2)     Status: None   Collection Time: 09/21/15  8:40 AM  Result Value Ref Range Status   Specimen Description BLOOD RIGHT HAND  Final   Special Requests BOTTLES DRAWN AEROBIC AND ANAEROBIC 10ML  Final    Culture   Final    NO GROWTH 5 DAYS Performed at [facility]    Report Status 09/26/2015 FINAL  Final  Culture, blood (routine x 2)     Status: None   Collection Time: 09/21/15  8:50 AM  Result Value Ref Range Status   Specimen Description BLOOD RIGHT HAND  Final   Special Requests BOTTLES DRAWN AEROBIC ONLY 10ML  Final   Culture   Final    NO GROWTH 5 DAYS Performed at [facility]    Report Status 09/26/2015 FINAL  Final  Culture, Urine     Status: None (Preliminary result)   Collection Time: 09/28/15  1:54 PM  Result Value Ref Range Status   Specimen Description URINE, CATHETERIZED  Final   Special Requests NONE  Final   Culture   Final    >=100,000 COLONIES/mL PSEUDOMONAS AERUGINOSA CULTURE REINCUBATED FOR BETTER GROWTH Performed at [facility]    Report Status PENDING  Incomplete    Medical History: Assessment: 64 yo male known to pharmacy for administration of chemo R-CHOP on 10/28. Now transferred to ICU for decreased 02Sats, fever and septic shock,  pharmacy to dose vancomycin and Zosyn. Patient now on ventilator, remains neutropenic on G-CSF, currently requiring multiple vasopressors  11/4 >> ceftaz x1  11/4 >> vanc >>  11/4 >> Zosyn >>  11/4 >> Anidulafungin >>   11/1 blood: NG-F  11/3: urine: >100K P. Aeruginosa (susc pending) 11/4 blood: pending 11/4 urine: pending  11/4 trach aspirate: pending Legionella urine Ag  Strep urine Ag  11/4: LA = 4.3 (trending up), PCT = 36.7   Labs for 09/30/2015  Renal: SCr worsening - UOP decreased (oliguric) WBC - remains neutropenic (ANC Pending) Tm - temp curve improved over last 12h, Tm = 102.1  Goal of Therapy:  Vancomycin trough level 15-20 mcg/ml  Plan:  D2 vancomycin/zosyn/anidulafungin  Discontinue current vancomycin order and check random level with am labs to evaluate clearance with worsening renal function  Change Zosyn 2.25g IV q6h based on decreased renal function  Await final  cultures and pseudomonas susceptibilties  Doreene Eland, PharmD, BCPS.   Pager: [phone] 09/30/2015 7:48 AM

## 2015-09-30 NOTE — Consult Note (Signed)
PULMONARY / CRITICAL CARE MEDICINE   Name: Carlos Black MRN: [id number] DOB: 1951/10/09    ADMISSION DATE:  09/24/2015 CONSULTATION DATE:   09/29/2015  REFERRING MD : [facility]  CHIEF COMPLAINT:  Altered mental status, shortness of breath  INITIAL PRESENTATION:  64 yo male with AKI after getting chemo for B cell lymphoma (CD20 positive).  Developed altered mental status and respiratory failure, and transferred to ICU.  STUDIES:  10/26 CT head >> chronic white matter disease 10/26 PET >> Lt retroperitoneal mass encasing ureter and Lt kidney, b/l adrenal malignancy, hypermetabolic pleural nodules and LAN, mod Lt effusion  SIGNIFICANT EVENTS: 10/24 Lt ureter stent, Lt orchiectomy >> diffuse large B cell lymphoma 10/25 Admit 10/26 Oncology consulted 10/27 Nephrology consulted 10/28 R-CHOP 11/03 Confused 11/04 Transfer to ICU, altered mental status, VDRF, Fever, Family discussion >> no CPR  SUBJECTIVE:  Pressor needs improved.  VITAL SIGNS: BP 54/23 mmHg  Pulse 101  Temp(Src) 99 F (37.2 C) (Axillary)  Resp 25  Ht 5\' 5"  (1.651 m)  Wt 196 lb 10.4 oz (89.2 kg)  BMI 32.72 kg/m2  SpO2 98% HEMODYNAMICS: CVP:  [11 mmHg-24 mmHg] 11 mmHg VENTILATOR SETTINGS: Vent Mode:  [-] PRVC FiO2 (%):  [30 %-70 %] 30 % Set Rate:  [14 bmp] 14 bmp Vt Set:  [530 mL] 530 mL PEEP:  [5 cmH20] 5 cmH20 Plateau Pressure:  [19 cmH20-24 cmH20] 19 cmH20 INTAKE / OUTPUT: I/O last 3 completed shifts: In: 9327.6 [I.V.:4584.2; Other:1000; IV Piggyback:3743.3] Out: 810 [Urine:810]  PHYSICAL EXAMINATION: General: ill appearing Neuro: RASS -2, moves Rt side HEENT: ETT in place Cardiovascular: regular, tachycardic Lungs: decreased BS Lt base Abdomen: soft, mild distention Musculoskeletal: 1+ edema Skin: sacral wound  LABS:  CBC  Recent Labs Lab 09/28/15 0330 09/29/15 0705 09/30/15 0540  WBC 5.2 0.3* 0.3*  HGB 8.5* 8.0* 7.8*  HCT 26.2* 24.8* 25.5*  PLT 298 178 182   Coag's  Recent  Labs Lab 09/29/15 0830 09/30/15 0540  APTT 34 43*  INR 1.49 2.08*   BMET  Recent Labs Lab 09/28/15 0330 09/29/15 0705 09/30/15 0540  NA 139 142 138  K 4.0 4.4 5.8*  CL 96* 102 103  CO2 31 29 14*  BUN 84* 78* 76*  CREATININE 3.60* 3.65* 3.99*  GLUCOSE 197* 225* 377*   Electrolytes  Recent Labs Lab 09/28/15 0330 09/29/15 0705 09/30/15 0540  CALCIUM 7.6* 7.2* 6.2*  MG  --   --  1.3*  PHOS 4.7* 4.6 7.7*   Sepsis Markers  Recent Labs Lab 09/28/15 2030 09/29/15 0830 09/29/15 1042 09/30/15 0540  LATICACIDVEN 1.9 3.7* 4.3*  --   PROCALCITON  --  36.70  --  >175.00   ABG  Recent Labs Lab 09/29/15 0755 09/29/15 1510 09/30/15 0500  PHART 7.538* 7.430 7.362  PCO2ART 28.5* 28.6* 19.5*  PO2ART 333* 167* 129*   Liver Enzymes  Recent Labs Lab 09/25/15 0600 09/26/15 0540 09/27/15 0522  AST 18 20 19   ALT 7* 6* 7*  ALKPHOS 107 101 99  BILITOT 0.7 0.9 0.4  ALBUMIN 2.0* 2.0* 2.0*   Cardiac Enzymes  Recent Labs Lab 09/29/15 0830  TROPONINI 0.05*   Glucose  Recent Labs Lab 09/28/15 2137 09/29/15 0806 09/29/15 1209 09/29/15 1656 09/29/15 2148 09/30/15 0839  GLUCAP 200* 189* 224* 247* 372* 329*    Imaging Dg Chest 2 View  09/28/2015  CLINICAL DATA:  Fever. Systemic inflammatory response syndrome. Non-Hodgkin's B-cell lymphoma. EXAM: CHEST  2 VIEW COMPARISON:  09/17/2015 FINDINGS: A  new right arm PICC line is seen with tip overlying the proximal right atrium approximately 1.5 cm below the superior cavoatrial junction. Low lung volumes are noted. Increased size of small left pleural effusion and left lower lobe atelectasis demonstrated. Mild right basilar atelectasis also noted. No pneumothorax visualized. IMPRESSION: New right arm PICC line tip overlies the proximal right atrium. Decreased lung volumes with increased left pleural effusion and left lower lobe atelectasis. Mild right basilar atelectasis also noted. Electronically Signed   By: Earle Gell  M.D.   On: 09/28/2015 15:04   Dg Chest Port 1 View  09/29/2015  CLINICAL DATA:  Hypoxia EXAM: PORTABLE CHEST 1 VIEW COMPARISON:  September 28, 2015 FINDINGS: Endotracheal tube tip is 1.9 cm above the carina. Central catheter tip is in the superior vena cava near the cavoatrial junction. No pneumothorax. There is persistent left lower lobe consolidation with small left effusion. There is mild atelectasis in the right base. The right lung is otherwise clear. Heart is upper normal in size with pulmonary vascularity within normal limits. No adenopathy. IMPRESSION: Tube and catheter positions as described without pneumothorax. Persistent left lower lobe consolidation with small left effusion. Mild right base atelectasis. Electronically Signed   By: Lowella Grip III M.D.   On: 09/29/2015 07:59     ASSESSMENT / PLAN:  PULMONARY ETT 11/04 >> A: Acute respiratory failure in setting of septic shock. P:   Full vent support F/u CXR, ABG  CARDIOVASCULAR Rt PICC 10/28 >> A:  Septic shock >> pressor needs improved some 11/05. Hx of CAD, HTN. P: Pressors to keep MAP > 65 F/u lactic acid  RENAL A:   AKI with Lt renal mass. Gap/Non gap metabolic acidosis. Hyperkalemia. CKD stage 3. P: F/u BMET Add HCO3 to IV fluid 11/05 F/u with nephrology  GASTROINTESTINAL A:   Protein calorie malnutrition. P:   NPO Protonix for SUP  HEMATOLOGIC A:   Diffuse large B cell lymphoma s/p R-CHOP 10/28. Chemo induced neutropenia. P:  F/u CBC Filgrastim per oncology Lovenox for DVT prevention  INFECTIOUS A:   Septic shock with febrile neutropenia. Sacral wound present prior to admission. P:   F/u CT abd/pelvis F/u procalcitonin Wound care  Day 2 of vancomycin, zosyn, anidulafungin  Urine 11/03 >> Pseudomonas Urine 11/04 >> Blood 11/04 >> Sputum 11/04 >> Pneumococcal Ag 11/04 >> Legionella Ag 11/04 >>  ENDOCRINE A:   Relative adrenal insufficiency >> cortisol 11.8 from 09/29/15. P:    Stress steroids  NEUROLOGIC A:   Acute metabolic encephalopathy. Hx of CVA with Lt sided weakness. P:   RASS goal: -1 Fentanyl/versed prn  Updated pt's family at bedside.  CC time 46 minutes.  Chesley Mires, MD [facility] Pulmonary/Critical Care 09/30/2015, 11:05 AM Pager:  [phone] After 3pm call: [phone]

## 2015-09-30 NOTE — Consult Note (Signed)
Consultation Note Date: 09/30/2015   Patient Name: Carlos Black  DOB: May 12, 1951  MRN: [id number]  Age / Sex: 64 y.o., male  PCP: Lavone Orn, MD Referring Physician: Juanito Doom, MD  Reason for Consultation: Establishing goals of care    Clinical Assessment/Narrative: Patient is a 64 year old gentleman with a past medical history significant for CVA with residual left-sided weakness, diabetes type 2, hypertension, CAD, CKD3 Patient was diagnosed with Left retroperitoneal mass, adrenal masses, left testicular mass, status post a CT-guided biopsy of the left retroperitoneal mass 09/08/2015-pathology confirmed large B-cell lymphoma, CD20 positive  Staging PET scan 09/20/2015 with extensive lymphoma involving the abdomen, adrenal glands, and left kidney  Cycle 1 R-CHOP 09/22/2015 (Dpse reduced based on renal and liver function)  Patient underwent orchiectomy, has had prolonged hospitalization course. Active hospital problems include Neutropenia due to chemotherapy with severe sepsis as suggested by a significant elevated procalcitonin level. The patient is currently sedated and intubated and mechanically ventilated in the intensive care unit. Patient has been diagnosed with septic shock, profound neutropenia. It is suspected that the source is questionable gut translocation. Patient was on 3 different types of pressors, patient currently has gone off to obtain a CT scan of the abdomen and pelvis.  Palliative care consultation has been requested by hematologist Dr. Irene Limbo. Met with the patient's wife, son, several other family members in the patient's room. Brief life review performed: Patient's family members state that the patient's last 6 months have shown gradual progressive decline. The patient has become more and more non-mobile, has had more fatigue, has been more sleepy, has been more anorexic. In terms of  the patient's previously expressed goals/wishes, the patient's family states that he has expressed before that he would not want to be kept alive by mechanical means if there was no scope for meaningful recovery  Introduced palliative care. Discussed the patient's current medical conditions in detail. All questions answered to the best of my ability. Plan: Continue current mode of care at least for the next 48 hours. Upon family members request, information given about scope of comfort care only-compassionate liberation from the ventilator, appropriate use of opioids/benzodiazepines and comfort care protocol etc.    Contacts/Participants in Discussion: Patient's wife, son and several other family members and friends Primary Decision Maker: Patient's wife Hassan Rowan, then son Layne and daughter Tammy Relationship to Patient wife, son, daughter HCPOA: yes  Wife Hassan Rowan [phone] Donoven Pett [phone] Daughter Tammy [phone]  SUMMARY OF RECOMMENDATIONS: Continue current mode of care at least for the next 24-48 hours Discussed about patient's partial code-no CPR that they had discussed with Dr. Lake Bells Patient's family does not believe the patient would want a tracheostomy or PEG tube placement if he is not able to come off of the vent They do not believe that prolonged mechanical ventilation would be in line with the patient's previously expressed wishes. Hence, initial information given about comfort measures/compassionate liberation from the ventilator process.  Palliative care will continue to follow along and help guide appropriate medical decision-making.  Code Status/Advance Care Planning: Limited code    Code Status Orders        Start     Ordered   09/29/15 0837  Limited resuscitation (code)   Continuous    Question Answer Comment  In the event of cardiac or respiratory ARREST: Perform CPR No   In the event of cardiac or respiratory ARREST: Perform Intubation/Mechanical  Ventilation Yes   In the event of cardiac or respiratory ARREST: Perform  Defibrillation or Cardioversion if indicated No   Antiarrhythmic drugs - Any drug used to treat arrhythmias Yes   Vasoactive drug - Any drug used to stabilize blood pressure Yes      09/29/15 0839      Other Directives:Other  Symptom Management:   Continue current measures  Palliative Prophylaxis:   Delirium Protocol  Additional Recommendations (Limitations, Scope, Preferences):  Continue to follow along to help guide decision-making  Psycho-social/Spiritual:  Support System: Strong Desire for further Chaplaincy support:no Additional Recommendations: Family members/friends would like to be present by the patient's bedside as much as possible  Prognosis: Unable to determine  Discharge Planning: Depending on hospitalization course   Chief Complaint/ Primary Diagnoses: Present on Admission:  . AKI (acute kidney injury) ([place]) . Weakness of left side of body . Malignant lymphoma, large B-cell, diffuse ([place]) . Fever . Hypoxia  I have reviewed the medical record, interviewed the patient and family, and examined the patient. The following aspects are pertinent.  Past Medical History  Diagnosis Date  . Hypertension   . Coronary artery disease   . Myocardial infarction ([place])   . CAD (coronary artery disease) 09/05/2015  . SIRS (systemic inflammatory response syndrome) ([place]) 09/06/2015  . Acute renal failure (ARF) ([place]) 09/06/2015  . Type II diabetes mellitus ([place])   . Stroke [facility]) 2004    w/left sided weakness/notes 09/05/2015   Social History   Social History  . Marital Status: Married    Spouse Name: N/A  . Number of Children: N/A  . Years of Education: N/A   Social History Main Topics  . Smoking status: Never Smoker   . Smokeless tobacco: Never Used  . Alcohol Use: No  . Drug Use: No  . Sexual Activity: Not Currently   Other Topics Concern  . None   Social History Narrative    patient is married, lives in [place], [place]. Patient has a son and a daughter. Family History  Problem Relation Age of Onset  . Diabetes Mellitus II Mother   . Pneumonia Father   . Diabetes Father    Scheduled Meds: . anidulafungin  100 mg Intravenous Q24H  . antiseptic oral rinse  7 mL Mouth Rinse QID  . aspirin  81 mg Oral Daily  . chlorhexidine gluconate  15 mL Mouth Rinse BID  . enoxaparin (LOVENOX) injection  30 mg Subcutaneous Q24H  . feeding supplement (GLUCERNA SHAKE)  237 mL Oral BID BM  . hydrocortisone sod succinate (SOLU-CORTEF) inj  50 mg Intravenous 4 times per day  . insulin aspart  0-15 Units Subcutaneous TID WC  . insulin aspart  0-5 Units Subcutaneous QHS  . pantoprazole (PROTONIX) IV  40 mg Intravenous Daily  . piperacillin-tazobactam (ZOSYN)  IV  2.25 g Intravenous 4 times per day  . sodium chloride  10-40 mL Intracatheter Q12H  . Tbo-Filgrastim  480 mcg Subcutaneous q1800   Continuous Infusions: . norepinephrine (LEVOPHED) Adult infusion 25 mcg/min (09/30/15 1130)  .  sodium bicarbonate  infusion 1000 mL 75 mL/hr at 09/30/15 1230   PRN Meds:.sodium chloride, [DISCONTINUED] acetaminophen **OR** acetaminophen, albuterol, alteplase, dextrose, diphenhydrAMINE, diphenhydrAMINE, EPINEPHrine, EPINEPHrine, EPINEPHrine, EPINEPHrine, famotidine, fentaNYL (SUBLIMAZE) injection, fludeoxyglucose F - 18, heparin lock flush, heparin lock flush, iohexol, methylPREDNISolone sodium succinate, midazolam, [DISCONTINUED] ondansetron **OR** ondansetron (ZOFRAN) IV, polyethylene glycol, sodium chloride, sodium chloride, sodium chloride Medications Prior to Admission:  Prior to Admission medications   Medication Sig Start Date End Date Taking? Authorizing Provider  amLODipine (NORVASC) 10 MG tablet  Take 1 tablet (10 mg total) by mouth daily. 12/25/11 09/08/2015 Yes Lavone Orn, MD  aspirin 81 MG chewable tablet Chew 1 tablet (81 mg total) by mouth daily. 09/14/15  Yes Domenic Polite, MD  atorvastatin (LIPITOR) 10 MG tablet Take 1 tablet (10 mg total) by mouth daily. 12/25/11 09/12/2015 Yes Lavone Orn, MD  ondansetron (ZOFRAN) 4 MG tablet Take 1 tablet (4 mg total) by mouth every 6 (six) hours as needed for nausea. 09/14/15  Yes Domenic Polite, MD  oxyCODONE (OXY IR/ROXICODONE) 5 MG immediate release tablet Take 1 tablet (5 mg total) by mouth every 4 (four) hours as needed for moderate pain. DEA# [id number] 09/18/15  Yes Christell Faith, MD  pantoprazole (PROTONIX) 40 MG tablet Take 1 tablet (40 mg total) by mouth at bedtime. 09/14/15  Yes Domenic Polite, MD  allopurinol (ZYLOPRIM) 300 MG tablet Take 300 mg by mouth daily. Start 09/20/15 for 10 days due to elevated uric acid    Historical Provider, MD   No Known Allergies CBC:    Component Value Date/Time   WBC 0.3* 09/30/2015 0540   HGB 7.8* 09/30/2015 0540   HCT 25.5* 09/30/2015 0540   PLT 182 09/30/2015 0540   MCV 79.7 09/30/2015 0540   NEUTROABS 0.0* 09/30/2015 0540   LYMPHSABS 0.2* 09/30/2015 0540   MONOABS 0.1 09/30/2015 0540   EOSABS 0.0 09/30/2015 0540   BASOSABS 0.0 09/30/2015 0540   Comprehensive Metabolic Panel:    Component Value Date/Time   NA 138 09/30/2015 0540   K 5.8* 09/30/2015 0540   CL 103 09/30/2015 0540   CO2 14* 09/30/2015 0540   BUN 76* 09/30/2015 0540   CREATININE 3.99* 09/30/2015 0540   GLUCOSE 377* 09/30/2015 0540   CALCIUM 6.2* 09/30/2015 0540   AST 409* 09/30/2015 1232   ALT <5* 09/30/2015 1232   ALKPHOS 100 09/30/2015 1232   BILITOT 1.2 09/30/2015 1232   PROT 5.3* 09/30/2015 1232   ALBUMIN 1.8* 09/30/2015 1232    Review of Systems Obtained through the patient's family members wife, son, several other family members: Patient with ongoing decline in functional status, weight loss, decrease in mobility, increased sleeping, anorexia since the past 6 months. Physical Exam Patient on the ventilator, intubated, mechanically ventilated Appears in no distress Detailed  physical examination not performed as the patient was taken away for CT scan of the abdomen and pelvis at the time of the family meeting. Vital Signs: BP 54/23 mmHg  Pulse 88  Temp(Src) 98.2 F (36.8 C) (Oral)  Resp 21  Ht _0  (1.651 m)  Wt 89.2 kg (196 lb 10.4 oz)  BMI 32.72 kg/m2  SpO2 97% SpO2: Last BM Date: 09/28/15  O2 Device:SpO2: 97 % O2 Flow Rate: .O2 Flow Rate (L/min): 15 L/min Intake/output summary:  Intake/Output Summary (Last 24 hours) at 09/30/15 1711 Last data filed at 09/30/15 1500  Gross per 24 hour  Intake 3161.53 ml  Output    310 ml  Net 2851.53 ml   LBM:  BMP Latest Ref Rng 09/30/2015 09/29/2015 09/28/2015  Glucose 65 - 99 mg/dL 377(H) 225(H) 197(H)  BUN 6 - 20 mg/dL 76(H) 78(H) 84(H)  Creatinine 0.61 - 1.24 mg/dL 3.99(H) 3.65(H) 3.60(H)  Sodium 135 - 145 mmol/L 138 142 139  Potassium 3.5 - 5.1 mmol/L 5.8(H) 4.4 4.0  Chloride 101 - 111 mmol/L 103 102 96(L)  CO2 22 - 32 mmol/L 14(L) 29 31  Calcium 8.9 - 10.3 mg/dL 6.2(LL) 7.2(L) 7.6(L)    Baseline Weight: Weight:  76.686 kg (169 lb 1 oz) Most recent weight: Weight: 89.2 kg (196 lb 10.4 oz)      Palliative Assessment/Data:  Flowsheet Rows        Most Recent Value   Intake Tab    Referral Department  Oncology   Unit at Time of Referral  ICU   Palliative Care Primary Diagnosis  Sepsis/Infectious Disease   Date Notified  09/30/15   Palliative Care Type  New Palliative care   Reason for referral  Clarify Goals of Care   Date of Admission  09/01/2015   Date first seen by Palliative Care  09/30/15   # of days Palliative referral response time  0 Day(s)   # of days IP prior to Palliative referral  11   Clinical Assessment    Palliative Performance Scale Score  10%   Pain Max last 24 hours  7   Pain Min Last 24 hours  6   Dyspnea Max Last 24 Hours  3   Dyspnea Min Last 24 hours  2   Nausea Max Last 24 Hours  1   Nausea Min Last 24 Hours  1   Anxiety Max Last 24 Hours  2   Anxiety Min Last 24 Hours  2     Other Max Last 24 Hours  0   Psychosocial & Spiritual Assessment    Social Work Plan of Care  Counseling, Participated in Family meeting   Palliative Care Outcomes    Patient/Family meeting held?  Yes   Who was at the meeting?  wife son   Palliative Care Outcomes  Clarified goals of care   Patient/Family wishes: Interventions discontinued/not started   Lurline Idol, PEG   Palliative Care follow-up planned  Yes, Facility      Additional Data Reviewed: Recent Labs     09/29/15  0705  09/30/15  0540  WBC  0.3*  0.3*  HGB  8.0*  7.8*  PLT  178  182  NA  142  138  BUN  78*  76*  CREATININE  3.65*  3.99*    Time In: 1600 Time Out: 1700 Time Total: 60 Greater than 50%  of this time was spent counseling and coordinating care related to the above assessment and plan.  Signed by: Loistine Chance, MD [phone] Loistine Chance, MD  09/30/2015, 5:11 PM  Please contact Palliative Medicine Team phone at [phone] for questions and concerns.

## 2015-09-30 NOTE — Progress Notes (Signed)
CRITICAL VALUE ALERT  Critical value received:  Calcium 6.2  Date of notification:  09/30/2015  Time of notification:  0656  Critical value read back:Yes.    Nurse who received alert:  S.Salwa Bai, RN  MD notified (1st page):  Elink on call notified  Time of first page:  [phone]  MD notified (2nd page):  Time of second page:  Responding MD:  Agnes Lawrence, RN  Time MD responded:  [phone]

## 2015-09-30 NOTE — Progress Notes (Signed)
[place] Progress Note Patient Name: Carlos Black DOB: 05/10/1951 MRN: [id number]   Date of Service  09/30/2015  HPI/Events of Note  K+ = 5.7. Ileus - no bowel sounds.   eICU Interventions  Will order: 1. Kayexalate 30 gm rectally now.      Intervention Category Major Interventions: Electrolyte abnormality - evaluation and management  Kaelan Amble Eugene 09/30/2015, 9:22 PM

## 2015-10-01 ENCOUNTER — Inpatient Hospital Stay (HOSPITAL_COMMUNITY): Payer: Medicare HMO

## 2015-10-01 DIAGNOSIS — Z515 Encounter for palliative care: Secondary | ICD-10-CM

## 2015-10-01 DIAGNOSIS — Z7189 Other specified counseling: Secondary | ICD-10-CM | POA: Insufficient documentation

## 2015-10-01 DIAGNOSIS — Z66 Do not resuscitate: Secondary | ICD-10-CM

## 2015-10-01 LAB — COMPREHENSIVE METABOLIC PANEL
ALBUMIN: 1.8 g/dL — AB (ref 3.5–5.0)
ALK PHOS: 188 U/L — AB (ref 38–126)
ALT: 446 U/L — AB (ref 17–63)
AST: 1169 U/L — AB (ref 15–41)
Anion gap: 26 — ABNORMAL HIGH (ref 5–15)
BUN: 82 mg/dL — AB (ref 6–20)
CALCIUM: 6.1 mg/dL — AB (ref 8.9–10.3)
CHLORIDE: 101 mmol/L (ref 101–111)
CO2: 12 mmol/L — AB (ref 22–32)
CREATININE: 4.71 mg/dL — AB (ref 0.61–1.24)
GFR calc non Af Amer: 12 mL/min — ABNORMAL LOW (ref >60)
GFR, EST AFRICAN AMERICAN: 14 mL/min — AB (ref >60)
GLUCOSE: 148 mg/dL — AB (ref 65–99)
Potassium: 5.3 mmol/L — ABNORMAL HIGH (ref 3.5–5.1)
SODIUM: 139 mmol/L (ref 135–145)
Total Bilirubin: 1.5 mg/dL — ABNORMAL HIGH (ref 0.3–1.2)
Total Protein: 5.3 g/dL — ABNORMAL LOW (ref 6.5–8.1)

## 2015-10-01 LAB — PROTIME-INR
INR: 2.25 — ABNORMAL HIGH (ref 0.00–1.49)
PROTHROMBIN TIME: 24.6 s — AB (ref 11.6–15.2)

## 2015-10-01 LAB — CBC WITH DIFFERENTIAL/PLATELET
BASOS ABS: 0 10*3/uL (ref 0.0–0.1)
Basophils Relative: 0 %
Eosinophils Absolute: 0 10*3/uL (ref 0.0–0.7)
Eosinophils Relative: 0 %
HCT: 25 % — ABNORMAL LOW (ref 39.0–52.0)
HEMOGLOBIN: 7.9 g/dL — AB (ref 13.0–17.0)
LYMPHS PCT: 22 %
Lymphs Abs: 0.2 10*3/uL — ABNORMAL LOW (ref 0.7–4.0)
MCH: 24.8 pg — AB (ref 26.0–34.0)
MCHC: 31.6 g/dL (ref 30.0–36.0)
MCV: 78.6 fL (ref 78.0–100.0)
Monocytes Absolute: 0.1 10*3/uL (ref 0.1–1.0)
Monocytes Relative: 6 %
NEUTROS ABS: 0.6 10*3/uL — AB (ref 1.7–7.7)
NEUTROS PCT: 72 %
Platelets: 76 10*3/uL — ABNORMAL LOW (ref 150–400)
RBC: 3.18 MIL/uL — ABNORMAL LOW (ref 4.22–5.81)
RDW: 18.7 % — ABNORMAL HIGH (ref 11.5–15.5)
WBC: 0.9 10*3/uL — CL (ref 4.0–10.5)

## 2015-10-01 LAB — BLOOD GAS, ARTERIAL
ACID-BASE DEFICIT: 12.2 mmol/L — AB (ref 0.0–2.0)
Bicarbonate: 11 mEq/L — ABNORMAL LOW (ref 20.0–24.0)
DRAWN BY: 232811
FIO2: 0.3
MECHVT: 530 mL
O2 SAT: 97.3 %
PATIENT TEMPERATURE: 99
PCO2 ART: 18.1 mmHg — AB (ref 35.0–45.0)
PEEP/CPAP: 5 cmH2O
PH ART: 7.404 (ref 7.350–7.450)
PO2 ART: 112 mmHg — AB (ref 80.0–100.0)
RATE: 18 resp/min
TCO2: 10.4 mmol/L (ref 0–100)

## 2015-10-01 LAB — GLUCOSE, CAPILLARY: GLUCOSE-CAPILLARY: 143 mg/dL — AB (ref 65–99)

## 2015-10-01 LAB — PROCALCITONIN: Procalcitonin: >175 ng/mL

## 2015-10-01 LAB — PHOSPHORUS: Phosphorus: 7.5 mg/dL — ABNORMAL HIGH (ref 2.5–4.6)

## 2015-10-01 LAB — MAGNESIUM: Magnesium: 1.7 mg/dL (ref 1.7–2.4)

## 2015-10-01 LAB — VANCOMYCIN, RANDOM: VANCOMYCIN RM: 21 ug/mL

## 2015-10-01 MED ORDER — SODIUM CHLORIDE 0.9 % IV SOLN
5.0000 mg/h | INTRAVENOUS | Status: DC
  Administered 2015-10-01: 5 mg/h via INTRAVENOUS
  Filled 2015-10-01: qty 10

## 2015-10-01 MED ORDER — MORPHINE SULFATE (PF) 4 MG/ML IV SOLN
4.0000 mg | INTRAVENOUS | Status: DC | PRN
  Administered 2015-10-01 (×2): 4 mg via INTRAVENOUS

## 2015-10-01 MED ORDER — MORPHINE SULFATE (PF) 2 MG/ML IV SOLN
INTRAVENOUS | Status: AC
  Administered 2015-10-01: 2 mg via INTRAVENOUS
  Filled 2015-10-01: qty 2

## 2015-10-01 MED ORDER — VANCOMYCIN HCL IN DEXTROSE 750-5 MG/150ML-% IV SOLN
750.0000 mg | Freq: Once | INTRAVENOUS | Status: DC
  Filled 2015-10-01: qty 150

## 2015-10-01 NOTE — Procedures (Signed)
Extubation Procedure Note  Patient Details:   Name: Carlos Black DOB: 1951-02-13 MRN: [id number]   Airway Documentation:     Evaluation  O2 sats: stable throughout Complications: No apparent complications Patient did not tolerate procedure well. Bilateral Breath Sounds: Diminished Suctioning: Airway No  Johnette Abraham 10/25/2015, 4:19 PM

## 2015-10-01 NOTE — Progress Notes (Signed)
Oncology Progress Note  Date of service 10/24/2015  S: Patient continues remain on breathing machine 1 multiple pressors with ongoing sepsis and multiorgan failure. Patient's family at bedside. The wife and other family members have discussed with ICU team and palliative care and have decided to pursue de-escalation of cares and focus on comfort cares after some family that is expected from [place] arrives this afternoon. No additional questions for me after our detailed discussion yesterday. Wife notes that she is very certain that the patient would not want to keep continuing aggressive medical cares in the presence of likely medical futility.  Objective .BP 107/65 mmHg  Pulse 81  Temp(Src) 97.8 F (36.6 C) (Oral)  Resp 23  Ht 5\' 5"  (1.651 m)  Wt 201 lb 11.5 oz (91.5 kg)  BMI 33.57 kg/m2  SpO2 99% CVS S1-S2 tachycardic RS decreased entry bilaterally bilateral basal rales Abdomen distended hypoactive bowel sounds. Neuro Sedated and intubated  LABS  . CBC Latest Ref Rng 10/09/2015 09/30/2015 09/29/2015  WBC 4.0 - 10.5 K/uL 0.9(LL) 0.3(LL) 0.3(LL)  Hemoglobin 13.0 - 17.0 g/dL 7.9(L) 7.8(L) 8.0(L)  Hematocrit 39.0 - 52.0 % 25.0(L) 25.5(L) 24.8(L)  Platelets 150 - 400 K/uL 76(L) 182 178   . CMP Latest Ref Rng 09/30/2015 09/30/2015 09/30/2015  Glucose 65 - 99 mg/dL 148(H) 238(H) -  BUN 6 - 20 mg/dL 82(H) 80(H) -  Creatinine 0.61 - 1.24 mg/dL 4.71(H) 4.37(H) -  Sodium 135 - 145 mmol/L 139 137 -  Potassium 3.5 - 5.1 mmol/L 5.3(H) 5.7(H) -  Chloride 101 - 111 mmol/L 101 100(L) -  CO2 22 - 32 mmol/L 12(L) 15(L) -  Calcium 8.9 - 10.3 mg/dL 6.1(LL) 6.0(LL) -  Total Protein 6.5 - 8.1 g/dL 5.3(L) - 5.3(L)  Total Bilirubin 0.3 - 1.2 mg/dL 1.5(H) - 1.2  Alkaline Phos 38 - 126 U/L 188(H) - 100  AST 15 - 41 U/L 1169(H) - 409(H)  ALT 17 - 63 U/L 446(H) - <5(L)     ASSESSMENT & PLAN:    1. Left retroperitoneal mass, adrenal masses, left testicular mass, status post a CT-guided biopsy of  the left retroperitoneal mass 09/08/2015-pathology confirmed large B-cell lymphoma, CD20 positive  Staging PET scan 09/20/2015 with extensive lymphoma involving the abdomen, adrenal glands, and left kidney  Cycle 1 R-CHOP 09/22/2015 (Dpse reduced based on renal and liver function)  2. Neutropenia due to chemotherapy with severe sepsis as suggested by a significant elevated procalcitonin level. 3.Renal failure, left hydronephrosis; left ureter stent placed 09/18/2015. Improved. Likely multifactorial in the setting of sepsis, severe hyperglycemia. Cannot rule out some element of tumor lysis syndrome. 4. Liver failure likely related to shock liver and sepsis 5. Severe protein calorie malnutrition with albumin level less than 2 6. Left hemiplegia-chronic following a CVA 7. Sepsis with septic shock and multiorgan failure with persistently elevated pro-calcitonin levels despite antibiotics 8. Multifactorial anemia due to anemia of chronic disease due to lymphoma, blood draws, sepsis, poor nutrition etc. Plan  -Excellent Palliative care team and MICU team help much appreciated. -Have another detailed discussion with the family at bedside. They have decided to proceed with comfort cares and de-escalate his critical cares at this time and consider extubation after the rest of the family arrived from [place].this decision is quite reasonable in the setting of multiorgan failure and very poor functional status and quality of life even prior to this critical illness. -Kindly let us know if there are any other questions that arise from an oncologic standpoint .  Sullivan Lone MD Evangeline AAHIVMS [facility] Hematology/Oncology Physician [place]  (Office):       [phone] (Work cell):  [phone] (Fax):           [phone]  TT 25 mins > 50% on direct bedside counselling and cares and co-rdination with the other involved medical teams.

## 2015-10-01 NOTE — Progress Notes (Signed)
Pt's wife, father, son, grandchildren and a host of other relatives were bedside when I arrived. Pt's father was holding his hand and the waited in anticipation for pt's passing. They were appropriately tearful during prayer around the bed. Please page if additional emotional support is needed at the appropriate time.  Chaplain Marlise Eves Holder   09/28/2015 1700  Clinical Encounter Type  Visited With Family

## 2015-10-01 NOTE — Progress Notes (Addendum)
At 0146 (the first hour for daylight savings time change) the patients arterial line reading increased suddenly to 188/100 (126). A cuff blood pressure reading of 128/89 (100) was obtained at that time. The arterial line was re-zeroed with the same reading. Elink on call physician was notified of the sudden change. The patients levophed was titrated down and the patients arterial line blood pressure slowly began to decrease. The levophed was adjusted several times to ensure a safe blood pressure was maintained. The patients blood pressure returned to baseline. Will continue to monitor. Luther Parody, RN

## 2015-10-01 NOTE — Progress Notes (Signed)
PCCM PROGRESS NOTE  Carlos Black is a 64 y.o. male admitted on 09/04/2015 with altered mental status and dyspnea.  He was found to have AKI.  He had recent Lt orchiectomy and ureter stent for retroperitoneal mass.  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  He was seen by oncology.  He was treated with R-CHOP.  He developed febrile neutropenia, septic shock, metabolic encephalopathy, and worsening AKI with gap/non gap metabolic acidosis.  CT abd/pelvis did not reveal specific treatable cause of his infection.  Family opted against dialysis.  Palliative care was consulted.  Decision was made for DNR status with plan to transition to comfort measures once remainder of family arrived.  Subjective: Comatose  Objective: BP 107/65 mmHg  Pulse 81  Temp(Src) 97.8 F (36.6 C) (Oral)  Resp 23  Ht 5\' 5"  (1.651 m)  Wt 201 lb 11.5 oz (91.5 kg)  BMI 33.57 kg/m2  SpO2 99%  General: comatose Neuro: unresponsive Cardiac: regular Chest: b/l rhonchi Abd: soft Ext: 1+ edema  CMP Latest Ref Rng 09/28/2015 09/30/2015 09/30/2015  Glucose 65 - 99 mg/dL 148(H) 238(H) -  BUN 6 - 20 mg/dL 82(H) 80(H) -  Creatinine 0.61 - 1.24 mg/dL 4.71(H) 4.37(H) -  Sodium 135 - 145 mmol/L 139 137 -  Potassium 3.5 - 5.1 mmol/L 5.3(H) 5.7(H) -  Chloride 101 - 111 mmol/L 101 100(L) -  CO2 22 - 32 mmol/L 12(L) 15(L) -  Calcium 8.9 - 10.3 mg/dL 6.1(LL) 6.0(LL) -  Total Protein 6.5 - 8.1 g/dL 5.3(L) - 5.3(L)  Total Bilirubin 0.3 - 1.2 mg/dL 1.5(H) - 1.2  Alkaline Phos 38 - 126 U/L 188(H) - 100  AST 15 - 41 U/L 1169(H) - 409(H)  ALT 17 - 63 U/L 446(H) - <5(L)    CBC Latest Ref Rng 10/20/2015 09/30/2015 09/29/2015  WBC 4.0 - 10.5 K/uL 0.9(LL) 0.3(LL) 0.3(LL)  Hemoglobin 13.0 - 17.0 g/dL 7.9(L) 7.8(L) 8.0(L)  Hematocrit 39.0 - 52.0 % 25.0(L) 25.5(L) 24.8(L)  Platelets 150 - 400 K/uL 76(L) 182 178    Ct Abdomen Pelvis Wo Contrast  09/30/2015  CLINICAL DATA:  64 year old male with history of large left  retroperitoneal mass and left testicular mass, recently diagnosed with high-grade B-cell lymphoma, presenting with worsening renal function and low-grade fevers. EXAM: CT ABDOMEN AND PELVIS WITHOUT CONTRAST TECHNIQUE: Multidetector CT imaging of the abdomen and pelvis was performed following the standard protocol without IV contrast. COMPARISON:  CT of the abdomen and pelvis 09/07/2015. FINDINGS: Lower chest: Small right and moderate left pleural effusions lying dependently with extensive passive atelectasis in the visualized portions of the lung bases bilaterally (the entire left lower lobe appears collapsed). Mild cardiomegaly. Atherosclerotic calcifications in the left main, left anterior descending, left circumflex and right coronary arteries. Central venous catheter tip terminating in the superior aspect of the right atrium. Nasogastric tube extending of the stomach. Hepatobiliary: No definite cystic or solid hepatic lesions are identified on today's noncontrast CT examination. Small amount of high attenuation material dependently in the gallbladder suggestive of a small gallstones. Gallbladder is moderately distended, and surrounded by some soft tissue stranding, however, this is nonspecific in the setting of small volume of ascites. Pancreas: No definite pancreatic mass identified on today's noncontrast CT examination. Spleen: Unremarkable. Adrenals/Urinary Tract: Bilateral adrenal gland lesions are again noted. The right adrenal gland lesion measures 2.8 cm in diameter and is low-attenuation (5 HU), compatible with an adenoma. The left adrenal gland measures up to 2.4 cm in diameter  and is 29 HU) indeterminate), but appears smaller than the prior study. Unenhanced appearance of the right kidney is unremarkable with there is perinephric stranding which is nonspecific. There is been interval placement of a left-sided double-J ureteral stent, with proximal loop properly reformed in the left renal pelvis and  distal loop reformed in the lumen of the urinary bladder. There continues to be infiltrative soft tissue in the left retroperitoneum, which extends all the way to the lower pole of the left kidney, inseparable from the left kidney, similar to the prior examination. Urinary bladder is decompressed, with a Foley balloon catheter in place. Stomach/Bowel: Unenhanced appearance of the stomach is normal. No pathologic dilatation of small bowel or colon. Vascular/Lymphatic: Extensive atherosclerosis throughout the abdominal and pelvic vasculature, without definite aneurysm. Again noted is a large infiltrative left-sided retroperitoneal nodal mass, which currently measures approximately 8.6 x 8.6 x 12.4 cm (axial image 45 of series 2, and coronal image 76 of series 602). This completely encases the left ureter, and extends all the way to the lower pole of the left kidney where it invades the left renal collecting system. This mass is also intimately associated with the lateral margin of the abdominal aorta, and encases the origin of the inferior mesenteric artery. Enlarged left periaortic lymph node measuring 2.2 cm in short axis, similar to the prior study. Multiple other adjacent retroperitoneal lymph nodes are also noted, mildly enlarged and similar to the prior study. Reproductive: Prostate gland and seminal vesicles are unremarkable in appearance. Diffuse scrotal swelling. Hydrocele. Other: Small volume of ascites.  No pneumoperitoneum. Musculoskeletal: Diffuse body wall edema. There are no aggressive appearing lytic or blastic lesions noted in the visualized portions of the skeleton. IMPRESSION: 1. Previously noted left retroperitoneal mass appears slightly smaller than the prior examination, as does a left adrenal mass, suggesting some positive response to therapy. 2. Small volume of ascites. 3. Interval placement of left-sided double-J ureteral stent which appears properly located. No residual left  hydroureteronephrosis. 4. Low-attenuation right adrenal nodule is unchanged, compatible with an adenoma. 5. Small right and moderate left pleural effusions with dependent passive atelectasis in the visualize lung bases (including complete atelectasis of the left lower lobe). 6. Cholelithiasis. Gallbladder appears distended, and is surrounded by some fluid. Accurate assessment for acute cholecystitis is limited on today's examination secondary to underlying ascites. If there is concern for acute cholecystitis, further evaluation with right upper quadrant ultrasound could be performed. 7. Atherosclerosis, including left main and 3 vessel coronary artery disease. Please note that although the presence of coronary artery calcium documents the presence of coronary artery disease, the severity of this disease and any potential stenosis cannot be assessed on this non-gated CT examination. Assessment for potential risk factor modification, dietary therapy or pharmacologic therapy may be warranted, if clinically indicated. 8. Additional incidental findings, as above. Electronically Signed   By: Vinnie Langton M.D.   On: 09/30/2015 18:34   Dg Chest Port 1 View  09/30/2015  CLINICAL DATA:  64 year old male with respiratory failure. EXAM: PORTABLE CHEST 1 VIEW COMPARISON:  Chest x-ray 09/29/2015. FINDINGS: An endotracheal tube is in place with tip 3.4 cm above the carina. There is a right upper extremity PICC with tip terminating in the superior cavoatrial junction. A nasogastric tube is seen extending into the stomach, however, the tip of the nasogastric tube extends below the lower margin of the image. Lung volumes are low linear opacity at the right lung base compatible with subsegmental atelectasis. Moderate left  pleural effusion with opacity at the left lung base which may reflect atelectasis and/or consolidation. No evidence of pulmonary edema. No pneumothorax. Heart size is normal. Upper mediastinal contours are  within normal limits. Atherosclerosis in the thoracic aorta. IMPRESSION: 1. Support apparatus, as above. 2. Moderate left pleural effusion with atelectasis and/or consolidation in the left lung base. There is also subsegmental atelectasis in the right lung base. 3. Atherosclerosis. Electronically Signed   By: Vinnie Langton M.D.   On: 09/26/2015 07:09   Assessment: Acute hypoxic respiratory failure Septic shock Pseudomonas UTI Febrile neutropenia Large B cell lymphoma Chemotherapy induced pancytopenia Shock liver with elevated LFT's AKI Metabolic acidosis Lactic acidosis Stage 3 CKD Hyperkalemia Hx of CAD and HTN Protein calorie malnutrition Sacral decubitus ulcer present prior to this admission Relative adrenal insufficiency Hx of CVA with Lt hemiparesis  Plan: DNR No escalation of care Continue prn sedation/analgesia as needed Transition to comfort care with removal of ventilator support when remainder of family arrives  Removed process of extubation with family.  Also explained that time course of dying process can be difficult to predict.  Chesley Mires, MD [facility] Pulmonary/Critical Care 10/16/2015, 10:02 AM Pager:  [phone] After 3pm call: [phone]

## 2015-10-01 NOTE — Progress Notes (Signed)
Pt expired at 2305. Verified with Luis Abed.  Family at bedside, chaplin paged, CCM notified. Family requested body to be pick up by funeral home from patient room.

## 2015-10-01 NOTE — Progress Notes (Signed)
ANTIBIOTIC CONSULT NOTE - Follow-up  Pharmacy Consult for vancomycin, Zosyn Indication: rule out sepsis  No Known Allergies  Patient Measurements: Height: 5\' 5"  (165.1 cm) Weight: 201 lb 11.5 oz (91.5 kg) IBW/kg (Calculated) : 61.5  Vital Signs: Temp: 97.8 F (36.6 C) (11/06 0400) Temp Source: Axillary (11/06 0400) BP: 117/84 mmHg (11/06 0341) Pulse Rate: 80 (11/06 0700) Intake/Output from previous day: 11/05 0701 - 11/06 0700 In: 2623.5 [I.V.:2293.5; IV Piggyback:330] Out: 690 [Urine:140; Emesis/NG output:550] Intake/Output from this shift:    Labs:  Recent Labs  09/29/15 0705 09/30/15 0540 09/30/15 1752 10/08/2015 0539  WBC 0.3* 0.3*  --  0.9*  HGB 8.0* 7.8*  --  7.9*  PLT 178 182  --  76*  CREATININE 3.65* 3.99* 4.37* 4.71*   Estimated Creatinine Clearance: 16 mL/min (by C-G formula based on Cr of 4.71).  Recent Labs  10/13/2015 0539  [facility] 21     Microbiology: Recent Results (from the past 720 hour(s))  Blood culture (routine x 2)     Status: None   Collection Time: 09/05/15  3:00 PM  Result Value Ref Range Status   Specimen Description BLOOD RIGHT ARM  Final   Special Requests BOTTLES DRAWN AEROBIC AND ANAEROBIC 5CC  Final   Culture NO GROWTH 5 DAYS  Final   Report Status 09/10/2015 FINAL  Final  Blood culture (routine x 2)     Status: None   Collection Time: 09/05/15  4:43 PM  Result Value Ref Range Status   Specimen Description BLOOD LEFT HAND  Final   Special Requests BOTTLES DRAWN AEROBIC AND ANAEROBIC 4CC  Final   Culture NO GROWTH 5 DAYS  Final   Report Status 09/10/2015 FINAL  Final  MRSA PCR Screening     Status: None   Collection Time: 09/05/15  8:28 PM  Result Value Ref Range Status   MRSA by PCR NEGATIVE NEGATIVE Final    Comment:        The GeneXpert MRSA Assay (FDA approved for NASAL specimens only), is one component of a comprehensive MRSA colonization surveillance program. It is not intended to diagnose MRSA infection nor  to guide or monitor treatment for MRSA infections.   Urine culture     Status: None   Collection Time: 09/05/15 10:20 PM  Result Value Ref Range Status   Specimen Description URINE, RANDOM  Final   Special Requests NONE  Final   Culture 3,000 COLONIES/mL INSIGNIFICANT GROWTH  Final   Report Status 09/07/2015 FINAL  Final  Culture, blood (routine x 2)     Status: None   Collection Time: 09/07/15  9:17 AM  Result Value Ref Range Status   Specimen Description BLOOD RIGHT ANTECUBITAL  Final   Special Requests BOTTLES DRAWN AEROBIC AND ANAEROBIC 5CC 10CC  Final   Culture NO GROWTH 5 DAYS  Final   Report Status 09/12/2015 FINAL  Final  Culture, blood (routine x 2)     Status: None   Collection Time: 09/07/15  9:27 AM  Result Value Ref Range Status   Specimen Description BLOOD RIGHT HAND  Final   Special Requests BOTTLES DRAWN AEROBIC AND ANAEROBIC 10CC  Final   Culture NO GROWTH 5 DAYS  Final   Report Status 09/12/2015 FINAL  Final  C difficile quick scan w PCR reflex     Status: None   Collection Time: 09/13/15  4:00 AM  Result Value Ref Range Status   C Diff antigen NEGATIVE NEGATIVE Final   C  Diff toxin NEGATIVE NEGATIVE Final   C Diff interpretation Negative for toxigenic C. difficile  Final  MRSA PCR Screening     Status: None   Collection Time: 09/07/2015 12:26 PM  Result Value Ref Range Status   MRSA by PCR NEGATIVE NEGATIVE Final    Comment:        The GeneXpert MRSA Assay (FDA approved for NASAL specimens only), is one component of a comprehensive MRSA colonization surveillance program. It is not intended to diagnose MRSA infection nor to guide or monitor treatment for MRSA infections.   Culture, blood (routine x 2)     Status: None   Collection Time: 09/21/15  8:40 AM  Result Value Ref Range Status   Specimen Description BLOOD RIGHT HAND  Final   Special Requests BOTTLES DRAWN AEROBIC AND ANAEROBIC 10ML  Final   Culture   Final    NO GROWTH 5 DAYS Performed at  [facility]    Report Status 09/26/2015 FINAL  Final  Culture, blood (routine x 2)     Status: None   Collection Time: 09/21/15  8:50 AM  Result Value Ref Range Status   Specimen Description BLOOD RIGHT HAND  Final   Special Requests BOTTLES DRAWN AEROBIC ONLY 10ML  Final   Culture   Final    NO GROWTH 5 DAYS Performed at [facility]    Report Status 09/26/2015 FINAL  Final  Culture, Urine     Status: None (Preliminary result)   Collection Time: 09/28/15  1:54 PM  Result Value Ref Range Status   Specimen Description URINE, CATHETERIZED  Final   Special Requests NONE  Final   Culture   Final    >=100,000 COLONIES/mL PSEUDOMONAS AERUGINOSA CULTURE REINCUBATED FOR BETTER GROWTH Performed at [facility]    Report Status PENDING  Incomplete  Culture, blood (routine x 2)     Status: None (Preliminary result)   Collection Time: 09/29/15  8:30 AM  Result Value Ref Range Status   Specimen Description BLOOD LEFT ARM  Final   Special Requests BOTTLES DRAWN AEROBIC AND ANAEROBIC 10CC  Final   Culture   Final    NO GROWTH < 24 HOURS Performed at [facility]    Report Status PENDING  Incomplete  Culture, blood (routine x 2)     Status: None (Preliminary result)   Collection Time: 09/29/15  8:40 AM  Result Value Ref Range Status   Specimen Description BLOOD LEFT HAND  Final   Special Requests IN PEDIATRIC BOTTLE 2CC  Final   Culture   Final    NO GROWTH < 24 HOURS Performed at [facility]    Report Status PENDING  Incomplete  Culture, respiratory (NON-Expectorated)     Status: None (Preliminary result)   Collection Time: 09/29/15  1:43 PM  Result Value Ref Range Status   Specimen Description TRACHEAL ASPIRATE  Final   Special Requests Immunocompromised  Final   Gram Stain   Final    FEW WBC PRESENT, PREDOMINANTLY PMN NO SQUAMOUS EPITHELIAL CELLS SEEN NO ORGANISMS SEEN Performed at [facility]    Culture NO GROWTH Performed at  [facility]   Final   Report Status PENDING  Incomplete  Culture, Urine     Status: None (Preliminary result)   Collection Time: 09/29/15  5:08 PM  Result Value Ref Range Status   Specimen Description URINE, CATHETERIZED  Final   Special Requests Immunocompromised  Final   Culture  Final    TOO YOUNG TO READ Performed at [facility]    Report Status PENDING  Incomplete    Medical History: Assessment: 64 yo male known to pharmacy for administration of chemo R-CHOP on 10/28. Now transferred to ICU for decreased 02Sats, fever and septic shock,  pharmacy to dose vancomycin and Zosyn. Patient now on ventilator, remains neutropenic on G-CSF, currently requiring multiple vasopressors  11/4 >> ceftaz x1  11/4 >> vanc >>  11/4 >> Zosyn >>  11/4 >> Anidulafungin >>   11/1 blood: NG-F  11/3: urine: >100K P. Aeruginosa (susc pending) 11/4 blood: NGTD 11/4 urine: pending  11/4 trach aspirate: NG Legionella urine Ag: pending Strep urine Ag: pending  Dose changes/levels 11/6 0500 vancomycin random 21 mcg/ml (last dose vanco 1gm at 2045 11/4)  11/4: LA = 4.3 (trending up), PCT = 36.7    10/16/2015  Renal: SCr worsening - UOP decreased (oliguric) WBC - remains neutropenic (ANC improved to 600) Tm - temp curve improved, afebrile x 24h - CT abd 11/5 reveals slightly dec tumor burden, possible cholecystitis  Goal of Therapy:  Vancomycin trough level 15-20 mcg/ml  Plan:  D3 vancomycin/zosyn/anidulafungin  Random vancomycin level, at upper end of goal, give vancomycin 750mg  IV x 1 to achieve peak of ~53mcg/ml.    Suggest recheck random vancomycin level 11/8am  Continue Zosyn 2.25g IV q6h based on decreased renal function  Await final cultures and pseudomonas susceptibilties  Doreene Eland, PharmD, BCPS.   Pager: [phone] 10/17/2015 7:10 AM

## 2015-10-01 NOTE — Care Management Important Message (Signed)
Important Message  Patient Details  Name: Mcguire Gasparyan MRN: [id number] Date of Birth: 07-18-1951   Medicare Important Message Given:  Yes-third notification given    Apolonio Schneiders, RN 10/20/2015, 10:15 AM

## 2015-10-01 NOTE — Progress Notes (Signed)
Rt extubate pt per MD order. (Treminal Extubated)

## 2015-10-01 NOTE — Progress Notes (Signed)
Initial Nutrition Assessment  DOCUMENTATION CODES:   Not applicable  INTERVENTION:   Family is opting for de-escalation of care, no intervention warranted at this time   NUTRITION DIAGNOSIS:   Inadequate oral intake related to inability to eat as evidenced by NPO status (Ventilator).  GOAL:   Patient will meet greater than or equal to 90% of their needs  MONITOR:   PO intake, I & O's, Labs, Skin  REASON FOR ASSESSMENT:   Ventilator    ASSESSMENT:   Carlos Black is a 64 y.o. male with past medical history of CVA with residual left-sided weakness, diabetes type 2, hypertension, CAD, CKD3, was just discharged from [facility] on 10/20, he was admitted for worsening weakness then and through his workup he was found to have fevers and large retroperitoneal mass with left-sided hydronephrosis, and a left testicular mass  Pt has had 3 hospitalizations in past month. Exhibiting severe anasarca. Family has opted for de-escalation of care in face of sepsis and multiorgan failure. No nutrition interventions warranted at this time.  Diet Order:     Skin:     Last BM:  09/29/2015  Height:   Ht Readings from Last 1 Encounters:  09/30/15 5\' 5"  (1.651 m)    Weight:   Wt Readings from Last 1 Encounters:  10/06/2015 201 lb 11.5 oz (91.5 kg)    Ideal Body Weight:  61.8 kg  BMI:  Body mass index is 33.57 kg/(m^2).  Estimated Nutritional Needs:   Kcal:  6389-3734 calories  Protein:  123.6 grams  Fluid:  Per MD rec  EDUCATION NEEDS:   No education needs identified at this time  Satira Anis. Ivis Nicolson, MS, RD LDN After Hours/Weekend Pager [phone]

## 2015-10-02 LAB — GLUCOSE, CAPILLARY: Glucose-Capillary: 144 mg/dL — ABNORMAL HIGH (ref 65–99)

## 2015-10-02 LAB — URINE CULTURE: Culture: >=100000

## 2015-10-02 LAB — HEPATIC FUNCTION PANEL
ALK PHOS: 100 U/L (ref 38–126)
ALT: 116 U/L — AB (ref 17–63)
AST: 409 U/L — AB (ref 15–41)
Albumin: 1.8 g/dL — ABNORMAL LOW (ref 3.5–5.0)
BILIRUBIN DIRECT: 0.6 mg/dL — AB (ref 0.1–0.5)
BILIRUBIN INDIRECT: 0.6 mg/dL (ref 0.3–0.9)
TOTAL PROTEIN: 5.3 g/dL — AB (ref 6.5–8.1)
Total Bilirubin: 1.2 mg/dL (ref 0.3–1.2)

## 2015-10-02 LAB — CULTURE, RESPIRATORY W GRAM STAIN

## 2015-10-02 LAB — CULTURE, RESPIRATORY

## 2015-10-03 LAB — URINE CULTURE

## 2015-10-04 ENCOUNTER — Telehealth: Payer: Self-pay

## 2015-10-04 LAB — CULTURE, BLOOD (ROUTINE X 2)
CULTURE: NO GROWTH
Culture: NO GROWTH

## 2015-10-04 NOTE — Telephone Encounter (Signed)
On 10/04/2015 I received a death certificate from [facility]. The death certificate is for burial. The patient is a patient of Doctor Sood. The death certificate will be taken to the pulmonary unit Monday am for signature. On October 26, 2015 I received the death certificate back from [place].  I got the death certificate ready for pickup and called the funeral home to let them the know the death certificate is ready for pickup.

## 2015-10-26 NOTE — Progress Notes (Signed)
Patient cleaned, bed placement called,  ready to be pick up by funeral home, family appreciated staffs and went home at [place].

## 2015-10-26 NOTE — Progress Notes (Signed)
Wasted 200 mg morphine IV drip at med room in the sink with [facility] [place].

## 2015-10-26 NOTE — Discharge Summary (Signed)
   Jaquarius Novey was a 64 y.o. male admitted on 09/03/2015 with altered mental status and dyspnea.  He was found to have AKI.  He had recent Lt orchiectomy and ureter stent for retroperitoneal mass.  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  He was seen by oncology.  He was treated with R-CHOP.  He developed febrile neutropenia, septic shock, metabolic encephalopathy, and worsening AKI with gap/non gap metabolic acidosis.  CT abd/pelvis did not reveal specific treatable cause of his infection.  Family opted against dialysis.  Palliative care was consulted.  Decision was made for DNR status with plan to transition to comfort measures once remainder of family arrived.  He was extubated on 10/09/2015.  He expired on 10/09/2015 at 2305.  Final Diagnoses: Acute hypoxic respiratory failure Septic shock Pseudomonas UTI Febrile neutropenia Large B cell lymphoma Chemotherapy induced pancytopenia Shock liver with elevated LFT's AKI Metabolic acidosis Lactic acidosis Stage 3 CKD Hyperkalemia Hx of CAD and HTN Protein calorie malnutrition Sacral decubitus ulcer present prior to this admission Relative adrenal insufficiency Hx of CVA with Lt hemiparesis  Chesley Mires, MD [facility] 10/05/2015, 10:26 PM

## 2015-10-26 NOTE — Progress Notes (Signed)
Called to pt room shortly after pt passing. His wife, sister-in-law, daughter, son and family friend were bedside. Pt's daughter wept (wishing he would wake up) as her mother consoled her. Each one took turns spending time bedside of pt, appropriately grieving. Family wanted [place] to have prayer w/them. Provided emotional and spiritual care as they said good-byes. Chaplain Marlise Eves Holder   10/14/2015 0000  Clinical Encounter Type  Visited With Family

## 2015-10-26 DEATH — deceased

## 2015-11-21 ENCOUNTER — Other Ambulatory Visit: Payer: Self-pay | Admitting: Nurse Practitioner

## 2017-06-01 IMAGING — MR MR HEAD W/O CM
8 of 10 series · 39 of 48 positions shown · non-contrast
Comparison: CT head September 05, 2015 at 2070 hours

CLINICAL DATA: LEFT lower extremity numbness and LEFT-sided
weakness for 2 days. Night cough for 2-3 weeks. History of stroke
and LEFT-sided weakness, diabetes, hypertension, coronary artery
disease.

EXAM:
MRI HEAD WITHOUT CONTRAST
TECHNIQUE: Multiplanar, multiecho pulse sequences of the brain and surrounding
structures were obtained without intravenous contrast.

[Series 3: DWI · axial · 3.0mm · 1.09mm/px · z∈[-121,+10]mm · 11 of 90 slices shown (1 of 4)]
[im 1/90]
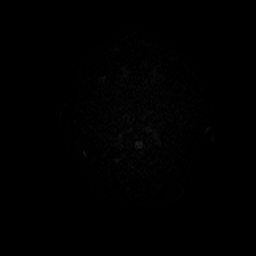
[im 9/90]
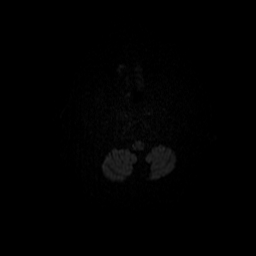
[im 18/90]
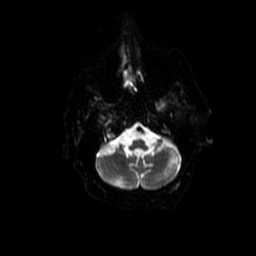
[im 27/90]
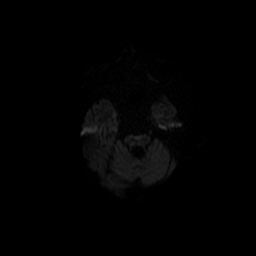
[im 36/90]
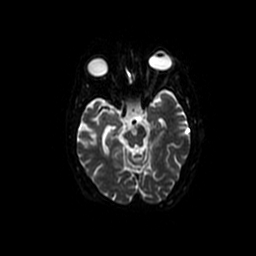
[im 45/90]
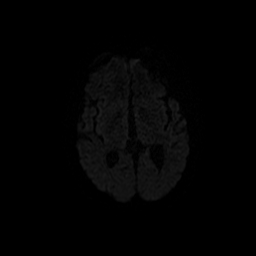
[im 54/90]
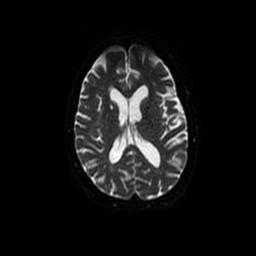
[im 63/90]
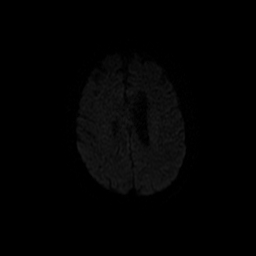
[im 72/90]
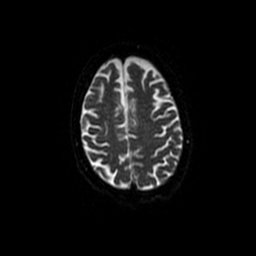
[im 81/90]
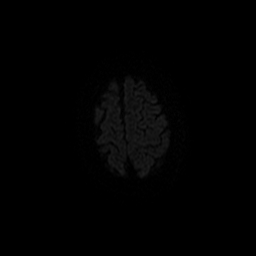
[im 90/90]
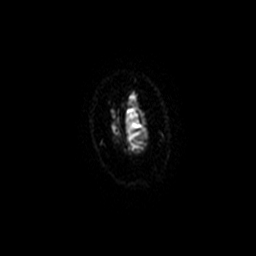

[Series 5: DWI · coronal · 5.0mm · 1.09mm/px · 8 of 64 slices shown (2 of 4)]
[im 1/64]
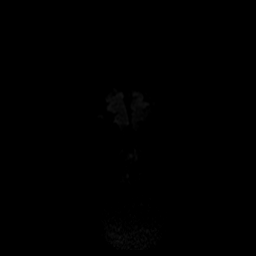
[im 10/64]
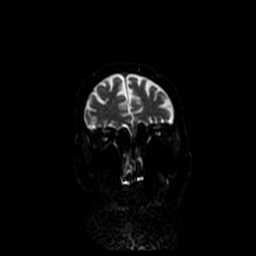
[im 19/64]
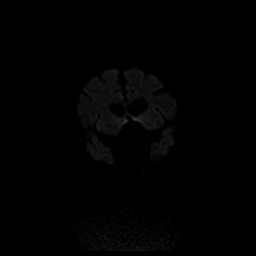
[im 28/64]
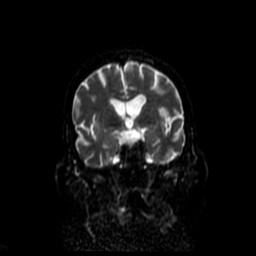
[im 37/64]
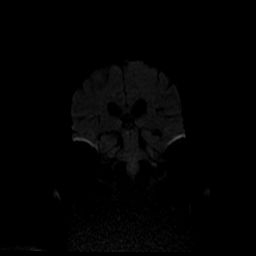
[im 46/64]
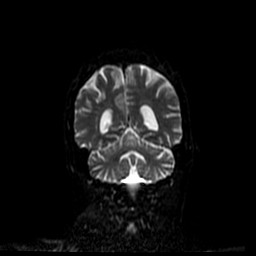
[im 55/64]
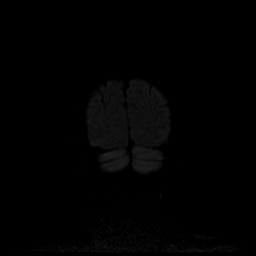
[im 64/64]
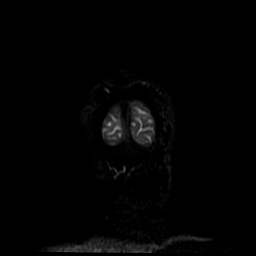

[Series 6: T2 · axial · 5.0mm · 0.43mm/px · z∈[-132,+10]mm · 3 of 25 slices shown (1 of 2)]
[im 1/25]
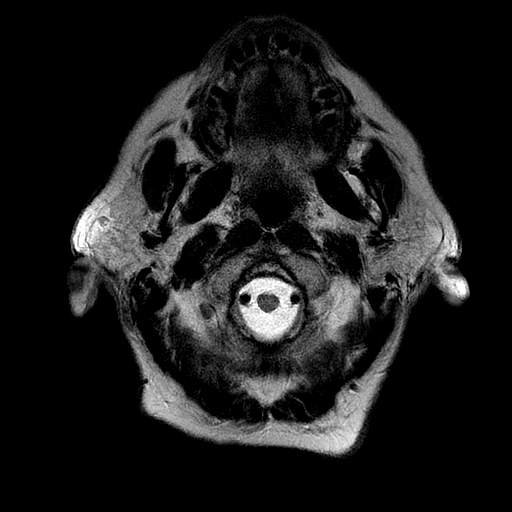
[im 13/25]
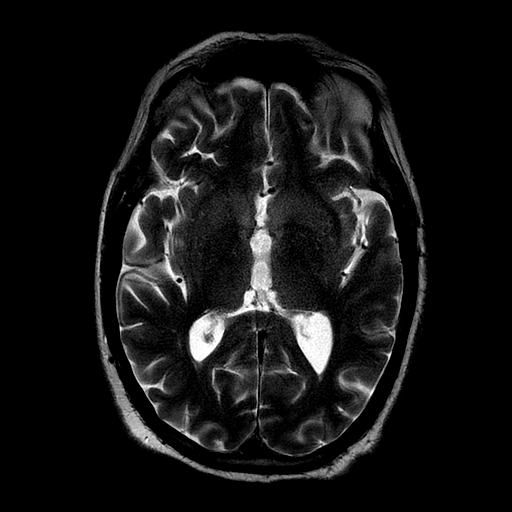
[im 25/25]
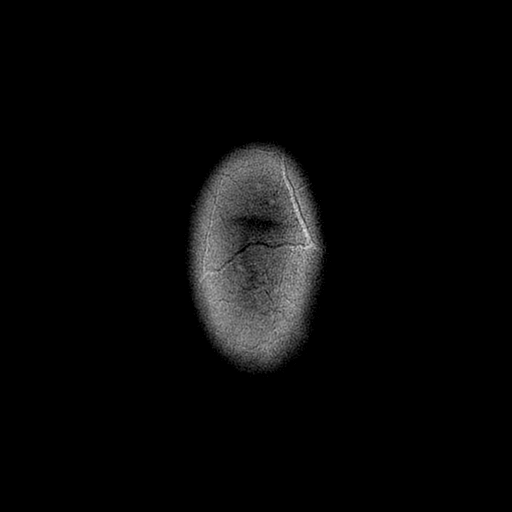

[Series 8: FLAIR · axial · 5.0mm · 0.43mm/px · z∈[-132,+10]mm · 3 of 25 slices shown (1 of 2)]
[im 1/25]
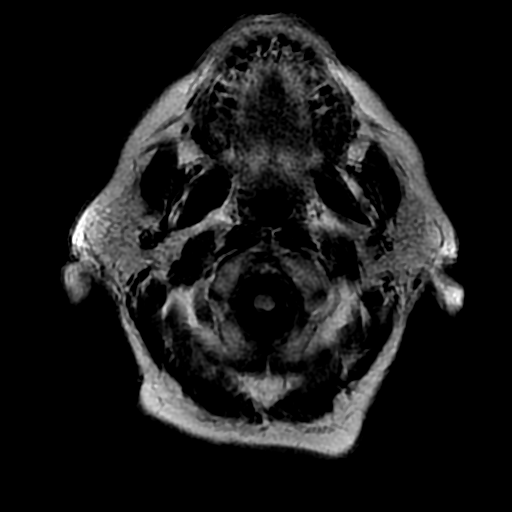
[im 13/25]
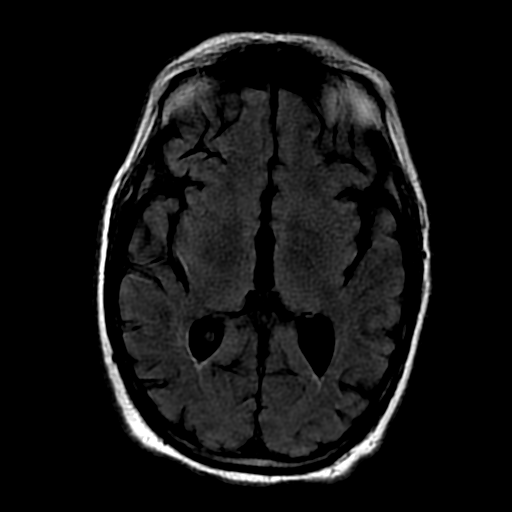
[im 25/25]
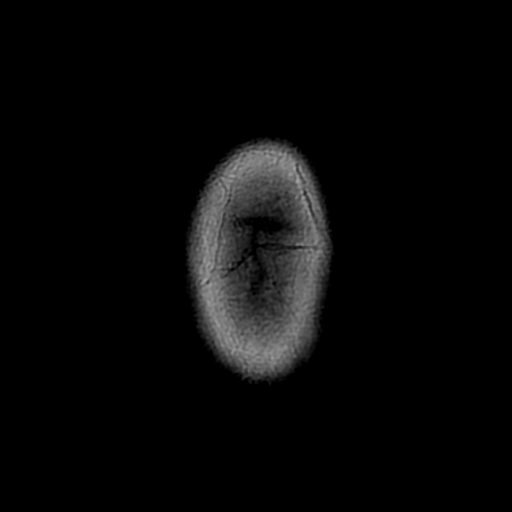

[Series 10: FLAIR · axial · 5.0mm · 0.43mm/px · z∈[-132,+10]mm · 3 of 25 slices shown (2 of 2)]
[im 1/25]
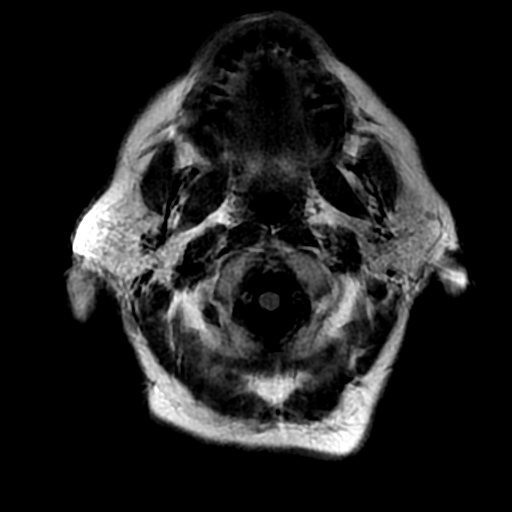
[im 13/25]
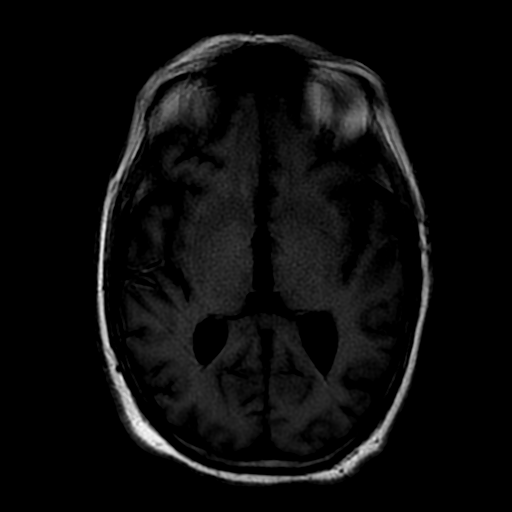
[im 25/25]
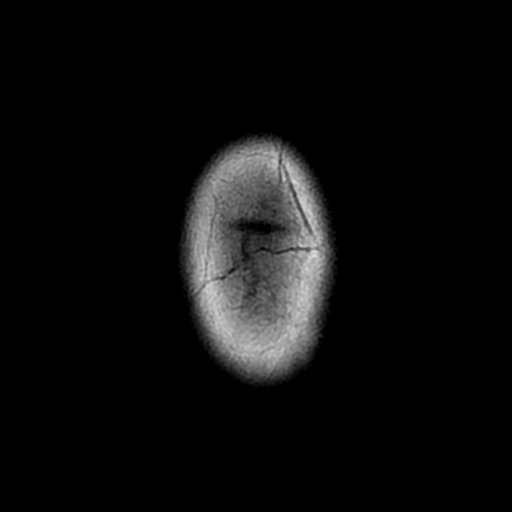

[Series 11: T2 · coronal · 5.0mm · 0.43mm/px · 1 of 30 slices shown (2 of 2)]
[im 1/30]
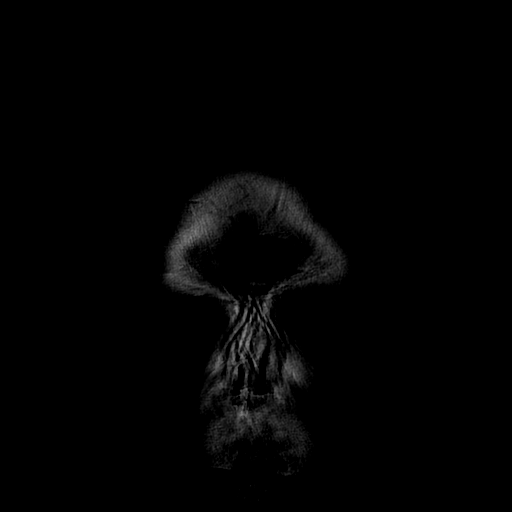

[Series 300: DWI · axial · 3.0mm · 1.09mm/px · z∈[-121,+10]mm · 6 of 45 slices shown (3 of 4)]
[im 1/45]
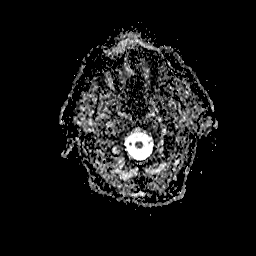
[im 9/45]
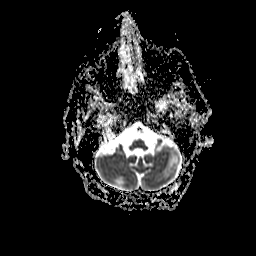
[im 18/45]
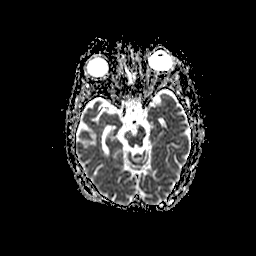
[im 27/45]
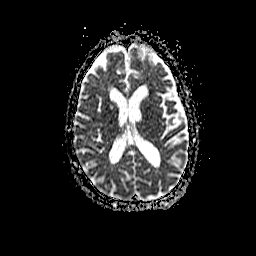
[im 36/45]
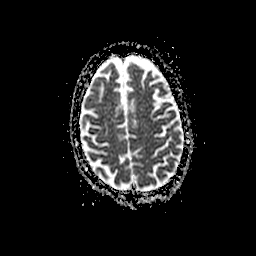
[im 45/45]
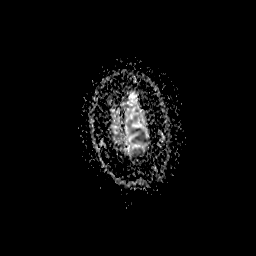

[Series 500: DWI · coronal · 5.0mm · 1.09mm/px · 4 of 32 slices shown (4 of 4)]
[im 1/32]
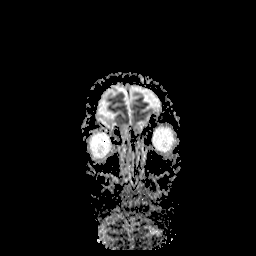
[im 11/32]
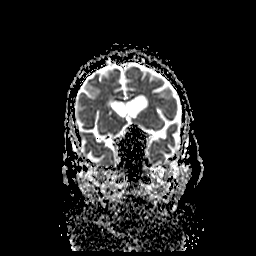
[im 21/32]
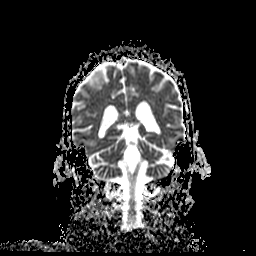
[im 32/32]
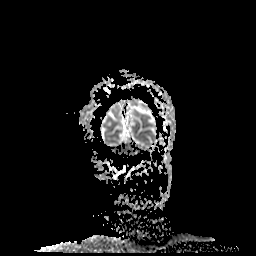

[39 of 48 positions shown; findings below may reference images not displayed]

FINDINGS: Multiple sequences are mild or moderately motion degraded.

No reduced diffusion to suggest acute ischemia. No susceptibility
artifact to suggest hemorrhage. Ventricles and sulci are normal for
patient's age. Old RIGHT ventral pons infarct. Old RIGHT basal
ganglia lacunar infarct. Mild RIGHT cerebral peduncle volume loss
compatible with wallerian degeneration. Mild white matter changes
compatible with chronic small vessel ischemic disease, decreased
sensitivity due to patient motion.

No abnormal extra-axial fluid collections. Dolicoectatic
intracranial vessel flow voids seen at the skull base.

Ocular globes and orbital contents are grossly normal though motion
degraded. Paranasal sinuses and mastoid air cells are well aerated.
No abnormal sellar expansion. No is abnormal cerebellar tonsillar
descent below the foramen magnum. No suspicious bone marrow signal.
IMPRESSION: No acute intracranial process on this motion degraded examination.

Involutional changes and mild chronic small vessel ischemic disease.
Old RIGHT pontine infarct. Old RIGHT basal ganglia lacunar infarct.
